# Patient Record
Sex: Male | Born: 1985 | Race: Black or African American | Hispanic: No | Marital: Single | Smoking: Current every day smoker
Health system: Southern US, Community
[De-identification: ages and names within clinical notes are randomized; demographics above are authoritative.]

## PROBLEM LIST (undated history)

## (undated) DIAGNOSIS — G629 Polyneuropathy, unspecified: Secondary | ICD-10-CM

## (undated) DIAGNOSIS — E119 Type 2 diabetes mellitus without complications: Secondary | ICD-10-CM

## (undated) DIAGNOSIS — F319 Bipolar disorder, unspecified: Secondary | ICD-10-CM

## (undated) HISTORY — PX: HEMORRHOID SURGERY: SHX153

## (undated) HISTORY — DX: Bipolar disorder, unspecified: F31.9

---

## 2021-08-09 ENCOUNTER — Emergency Department (HOSPITAL_BASED_OUTPATIENT_CLINIC_OR_DEPARTMENT_OTHER)
Admission: EM | Admit: 2021-08-09 | Discharge: 2021-08-09 | Discharge status: Against Medical Advice | Payer: Medicare Other | Attending: Emergency Medicine | Admitting: Emergency Medicine

## 2021-08-09 ENCOUNTER — Other Ambulatory Visit: Payer: Self-pay

## 2021-08-09 ENCOUNTER — Encounter (HOSPITAL_BASED_OUTPATIENT_CLINIC_OR_DEPARTMENT_OTHER): Payer: Self-pay | Admitting: Emergency Medicine

## 2021-08-09 DIAGNOSIS — G629 Polyneuropathy, unspecified: Secondary | ICD-10-CM | POA: Diagnosis not present

## 2021-08-09 DIAGNOSIS — E860 Dehydration: Secondary | ICD-10-CM | POA: Insufficient documentation

## 2021-08-09 DIAGNOSIS — R739 Hyperglycemia, unspecified: Secondary | ICD-10-CM | POA: Insufficient documentation

## 2021-08-09 DIAGNOSIS — E872 Acidosis, unspecified: Secondary | ICD-10-CM

## 2021-08-09 HISTORY — DX: Type 2 diabetes mellitus without complications: E11.9

## 2021-08-09 LAB — URINALYSIS, ROUTINE W REFLEX MICROSCOPIC
Bilirubin Urine: NEGATIVE
Glucose, UA: >=500 mg/dL — AB
Ketones, ur: >=80 mg/dL — AB
Leukocytes,Ua: NEGATIVE
Nitrite: NEGATIVE
Protein, ur: NEGATIVE mg/dL
Specific Gravity, Urine: <1.005 — ABNORMAL LOW (ref 1.005–1.030)
pH: 5 (ref 5.0–8.0)

## 2021-08-09 LAB — COMPREHENSIVE METABOLIC PANEL
ALT: 12 U/L (ref 0–44)
AST: 14 U/L — ABNORMAL LOW (ref 15–41)
Albumin: 2.9 g/dL — ABNORMAL LOW (ref 3.5–5.0)
Alkaline Phosphatase: 143 U/L — ABNORMAL HIGH (ref 38–126)
Anion gap: 16 — ABNORMAL HIGH (ref 5–15)
BUN: 15 mg/dL (ref 6–20)
CO2: 20 mmol/L — ABNORMAL LOW (ref 22–32)
Calcium: 8.4 mg/dL — ABNORMAL LOW (ref 8.9–10.3)
Chloride: 91 mmol/L — ABNORMAL LOW (ref 98–111)
Creatinine, Ser: 1.51 mg/dL — ABNORMAL HIGH (ref 0.61–1.24)
GFR, Estimated: >60 mL/min (ref >60)
Glucose, Bld: 522 mg/dL (ref 70–99)
Potassium: 4.9 mmol/L (ref 3.5–5.1)
Sodium: 127 mmol/L — ABNORMAL LOW (ref 135–145)
Total Bilirubin: 1.3 mg/dL — ABNORMAL HIGH (ref 0.3–1.2)
Total Protein: 7.6 g/dL (ref 6.5–8.1)

## 2021-08-09 LAB — I-STAT VENOUS BLOOD GAS, ED
Acid-base deficit: 1 mmol/L (ref 0.0–2.0)
Bicarbonate: 23 mmol/L (ref 20.0–28.0)
Calcium, Ion: 1.17 mmol/L (ref 1.15–1.40)
HCT: 31 % — ABNORMAL LOW (ref 39.0–52.0)
Hemoglobin: 10.5 g/dL — ABNORMAL LOW (ref 13.0–17.0)
O2 Saturation: 89 %
Patient temperature: 98.2
Potassium: 4.3 mmol/L (ref 3.5–5.1)
Sodium: 130 mmol/L — ABNORMAL LOW (ref 135–145)
TCO2: 24 mmol/L (ref 22–32)
pCO2, Ven: 33.3 mmHg — ABNORMAL LOW (ref 44–60)
pH, Ven: 7.447 — ABNORMAL HIGH (ref 7.25–7.43)
pO2, Ven: 53 mmHg — ABNORMAL HIGH (ref 32–45)

## 2021-08-09 LAB — CBG MONITORING, ED: Glucose-Capillary: 507 mg/dL (ref 70–99)

## 2021-08-09 LAB — CBC WITH DIFFERENTIAL/PLATELET
Abs Immature Granulocytes: 0.06 10*3/uL (ref 0.00–0.07)
Basophils Absolute: 0 10*3/uL (ref 0.0–0.1)
Basophils Relative: 0 %
Eosinophils Absolute: 0.1 10*3/uL (ref 0.0–0.5)
Eosinophils Relative: 1 %
HCT: 32.5 % — ABNORMAL LOW (ref 39.0–52.0)
Hemoglobin: 11.2 g/dL — ABNORMAL LOW (ref 13.0–17.0)
Immature Granulocytes: 1 %
Lymphocytes Relative: 19 %
Lymphs Abs: 1 10*3/uL (ref 0.7–4.0)
MCH: 27.5 pg (ref 26.0–34.0)
MCHC: 34.5 g/dL (ref 30.0–36.0)
MCV: 79.7 fL — ABNORMAL LOW (ref 80.0–100.0)
Monocytes Absolute: 0.5 10*3/uL (ref 0.1–1.0)
Monocytes Relative: 9 %
Neutro Abs: 3.6 10*3/uL (ref 1.7–7.7)
Neutrophils Relative %: 70 %
Platelets: 300 10*3/uL (ref 150–400)
RBC: 4.08 MIL/uL — ABNORMAL LOW (ref 4.22–5.81)
RDW: 13.5 % (ref 11.5–15.5)
WBC: 5.3 10*3/uL (ref 4.0–10.5)
nRBC: 0 % (ref 0.0–0.2)

## 2021-08-09 LAB — URINALYSIS, MICROSCOPIC (REFLEX)

## 2021-08-09 MED ORDER — OXYCODONE HCL 5 MG PO TABS
10.0000 mg | ORAL_TABLET | Freq: Once | ORAL | Status: AC
  Administered 2021-08-09: 10 mg via ORAL
  Filled 2021-08-09: qty 2

## 2021-08-09 MED ORDER — PREGABALIN 300 MG PO CAPS: 300.0000 mg | ORAL_CAPSULE | Freq: Three times a day (TID) | ORAL | 0 refills | Status: DC

## 2021-08-09 MED ORDER — LACTATED RINGERS IV BOLUS
1000.0000 mL | Freq: Once | INTRAVENOUS | Status: AC
  Administered 2021-08-09: 1000 mL via INTRAVENOUS

## 2021-08-09 MED ORDER — INSULIN LISPRO 100 UNIT/ML IJ SOLN: 40.0000 [IU] | Freq: Three times a day (TID) | INTRAMUSCULAR | 11 refills | Status: DC

## 2021-08-09 MED ORDER — INSULIN DETEMIR 100 UNIT/ML ~~LOC~~ SOLN
30.0000 [IU] | Freq: Every day | SUBCUTANEOUS | 11 refills | Status: DC
Start: 2021-08-09 — End: 2021-09-02

## 2021-08-09 MED ORDER — TRAZODONE HCL 150 MG PO TABS
150.0000 mg | ORAL_TABLET | Freq: Every day | ORAL | 0 refills | Status: DC
Start: 2021-08-09 — End: 2021-10-05

## 2021-08-09 MED ORDER — INSULIN DETEMIR 100 UNIT/ML ~~LOC~~ SOLN: 30.0000 [IU] | Freq: Every day | SUBCUTANEOUS | 11 refills | Status: DC

## 2021-08-09 MED ORDER — INSULIN GLARGINE-YFGN 100 UNIT/ML ~~LOC~~ SOLN
30.0000 [IU] | Freq: Once | SUBCUTANEOUS | Status: AC
  Administered 2021-08-09: 30 [IU] via SUBCUTANEOUS
  Filled 2021-08-09: qty 300

## 2021-08-09 MED ORDER — PREGABALIN 75 MG PO CAPS
300.0000 mg | ORAL_CAPSULE | Freq: Once | ORAL | Status: AC
  Administered 2021-08-09: 300 mg via ORAL
  Filled 2021-08-09: qty 4

## 2021-08-09 MED ORDER — INSULIN ASPART PROT & ASPART (70-30 MIX) 100 UNIT/ML ~~LOC~~ SUSP: 40.0000 [IU] | Freq: Three times a day (TID) | SUBCUTANEOUS | 11 refills | Status: DC

## 2021-08-09 MED ORDER — INSULIN ASPART 100 UNIT/ML IJ SOLN
10.0000 [IU] | Freq: Once | INTRAMUSCULAR | Status: AC
  Administered 2021-08-09: 10 [IU] via SUBCUTANEOUS

## 2021-08-09 NOTE — ED Triage Notes (Addendum)
Pt states he has been without his humalog and Levemir for about a week. His CBG meter at home is just reading high, and hes c/o feeling dehydrated. Denies SHOB or other sx. Also c/o pain in his legs "due to neuropathy. ?

## 2021-08-17 ENCOUNTER — Inpatient Hospital Stay (HOSPITAL_BASED_OUTPATIENT_CLINIC_OR_DEPARTMENT_OTHER)
Admission: EM | Admit: 2021-08-17 | Discharge: 2021-08-20 | DRG: 638 | Discharge status: Against Medical Advice | Payer: Medicare Other | Attending: Family Medicine | Admitting: Family Medicine

## 2021-08-17 ENCOUNTER — Encounter (HOSPITAL_BASED_OUTPATIENT_CLINIC_OR_DEPARTMENT_OTHER): Payer: Self-pay | Admitting: *Deleted

## 2021-08-17 ENCOUNTER — Other Ambulatory Visit: Payer: Self-pay

## 2021-08-17 ENCOUNTER — Emergency Department (HOSPITAL_BASED_OUTPATIENT_CLINIC_OR_DEPARTMENT_OTHER): Payer: Medicare Other

## 2021-08-17 DIAGNOSIS — R262 Difficulty in walking, not elsewhere classified: Secondary | ICD-10-CM

## 2021-08-17 DIAGNOSIS — R200 Anesthesia of skin: Secondary | ICD-10-CM

## 2021-08-17 DIAGNOSIS — E860 Dehydration: Secondary | ICD-10-CM | POA: Diagnosis present

## 2021-08-17 DIAGNOSIS — R4189 Other symptoms and signs involving cognitive functions and awareness: Secondary | ICD-10-CM

## 2021-08-17 DIAGNOSIS — D649 Anemia, unspecified: Secondary | ICD-10-CM | POA: Diagnosis not present

## 2021-08-17 DIAGNOSIS — F319 Bipolar disorder, unspecified: Secondary | ICD-10-CM | POA: Diagnosis not present

## 2021-08-17 DIAGNOSIS — Z87440 Personal history of urinary (tract) infections: Secondary | ICD-10-CM

## 2021-08-17 DIAGNOSIS — G894 Chronic pain syndrome: Secondary | ICD-10-CM | POA: Diagnosis not present

## 2021-08-17 DIAGNOSIS — T50995A Adverse effect of other drugs, medicaments and biological substances, initial encounter: Secondary | ICD-10-CM | POA: Diagnosis not present

## 2021-08-17 DIAGNOSIS — N179 Acute kidney failure, unspecified: Secondary | ICD-10-CM | POA: Diagnosis not present

## 2021-08-17 DIAGNOSIS — G629 Polyneuropathy, unspecified: Secondary | ICD-10-CM

## 2021-08-17 DIAGNOSIS — E1042 Type 1 diabetes mellitus with diabetic polyneuropathy: Secondary | ICD-10-CM | POA: Diagnosis present

## 2021-08-17 DIAGNOSIS — R739 Hyperglycemia, unspecified: Secondary | ICD-10-CM | POA: Diagnosis present

## 2021-08-17 DIAGNOSIS — E1065 Type 1 diabetes mellitus with hyperglycemia: Principal | ICD-10-CM | POA: Diagnosis present

## 2021-08-17 DIAGNOSIS — Z794 Long term (current) use of insulin: Secondary | ICD-10-CM | POA: Diagnosis not present

## 2021-08-17 DIAGNOSIS — R432 Parageusia: Secondary | ICD-10-CM | POA: Diagnosis present

## 2021-08-17 DIAGNOSIS — Z8616 Personal history of COVID-19: Secondary | ICD-10-CM | POA: Diagnosis not present

## 2021-08-17 DIAGNOSIS — F17210 Nicotine dependence, cigarettes, uncomplicated: Secondary | ICD-10-CM | POA: Diagnosis not present

## 2021-08-17 DIAGNOSIS — Z79899 Other long term (current) drug therapy: Secondary | ICD-10-CM

## 2021-08-17 DIAGNOSIS — F419 Anxiety disorder, unspecified: Secondary | ICD-10-CM | POA: Diagnosis present

## 2021-08-17 DIAGNOSIS — Z5329 Procedure and treatment not carried out because of patient's decision for other reasons: Secondary | ICD-10-CM | POA: Diagnosis not present

## 2021-08-17 DIAGNOSIS — R93429 Abnormal radiologic findings on diagnostic imaging of unspecified kidney: Secondary | ICD-10-CM

## 2021-08-17 DIAGNOSIS — R404 Transient alteration of awareness: Secondary | ICD-10-CM | POA: Diagnosis not present

## 2021-08-17 DIAGNOSIS — Z87898 Personal history of other specified conditions: Secondary | ICD-10-CM

## 2021-08-17 HISTORY — DX: Polyneuropathy, unspecified: G62.9

## 2021-08-17 LAB — BASIC METABOLIC PANEL
Anion gap: 11 (ref 5–15)
BUN: 11 mg/dL (ref 6–20)
CO2: 24 mmol/L (ref 22–32)
Calcium: 9.6 mg/dL (ref 8.9–10.3)
Chloride: 95 mmol/L — ABNORMAL LOW (ref 98–111)
Creatinine, Ser: 1.27 mg/dL — ABNORMAL HIGH (ref 0.61–1.24)
GFR, Estimated: >60 mL/min (ref >60)
Glucose, Bld: 399 mg/dL — ABNORMAL HIGH (ref 70–99)
Potassium: 4.1 mmol/L (ref 3.5–5.1)
Sodium: 130 mmol/L — ABNORMAL LOW (ref 135–145)

## 2021-08-17 LAB — CBC WITH DIFFERENTIAL/PLATELET
Abs Immature Granulocytes: 0.03 10*3/uL (ref 0.00–0.07)
Basophils Absolute: 0 10*3/uL (ref 0.0–0.1)
Basophils Relative: 0 %
Eosinophils Absolute: 0 10*3/uL (ref 0.0–0.5)
Eosinophils Relative: 1 %
HCT: 33.8 % — ABNORMAL LOW (ref 39.0–52.0)
Hemoglobin: 11.4 g/dL — ABNORMAL LOW (ref 13.0–17.0)
Immature Granulocytes: 1 %
Lymphocytes Relative: 19 %
Lymphs Abs: 1 10*3/uL (ref 0.7–4.0)
MCH: 27.1 pg (ref 26.0–34.0)
MCHC: 33.7 g/dL (ref 30.0–36.0)
MCV: 80.3 fL (ref 80.0–100.0)
Monocytes Absolute: 0.5 10*3/uL (ref 0.1–1.0)
Monocytes Relative: 9 %
Neutro Abs: 3.7 10*3/uL (ref 1.7–7.7)
Neutrophils Relative %: 70 %
Platelets: 377 10*3/uL (ref 150–400)
RBC: 4.21 MIL/uL — ABNORMAL LOW (ref 4.22–5.81)
RDW: 13.1 % (ref 11.5–15.5)
WBC: 5.2 10*3/uL (ref 4.0–10.5)
nRBC: 0 % (ref 0.0–0.2)

## 2021-08-17 LAB — URINALYSIS, ROUTINE W REFLEX MICROSCOPIC
Bilirubin Urine: NEGATIVE
Glucose, UA: >=500 mg/dL — AB
Ketones, ur: NEGATIVE mg/dL
Leukocytes,Ua: NEGATIVE
Nitrite: NEGATIVE
Protein, ur: NEGATIVE mg/dL
Specific Gravity, Urine: 1.015 (ref 1.005–1.030)
pH: 7 (ref 5.0–8.0)

## 2021-08-17 LAB — GLUCOSE, CAPILLARY
Glucose-Capillary: 169 mg/dL — ABNORMAL HIGH (ref 70–99)
Glucose-Capillary: 208 mg/dL — ABNORMAL HIGH (ref 70–99)
Glucose-Capillary: 226 mg/dL — ABNORMAL HIGH (ref 70–99)
Glucose-Capillary: 271 mg/dL — ABNORMAL HIGH (ref 70–99)
Glucose-Capillary: 278 mg/dL — ABNORMAL HIGH (ref 70–99)
Glucose-Capillary: 417 mg/dL — ABNORMAL HIGH (ref 70–99)

## 2021-08-17 LAB — LIPASE, BLOOD: Lipase: 41 U/L (ref 11–51)

## 2021-08-17 LAB — PHOSPHORUS: Phosphorus: 4 mg/dL (ref 2.5–4.6)

## 2021-08-17 LAB — I-STAT VENOUS BLOOD GAS, ED
Acid-Base Excess: 7 mmol/L — ABNORMAL HIGH (ref 0.0–2.0)
Bicarbonate: 32.1 mmol/L — ABNORMAL HIGH (ref 20.0–28.0)
Calcium, Ion: 1.15 mmol/L (ref 1.15–1.40)
HCT: 40 % (ref 39.0–52.0)
Hemoglobin: 13.6 g/dL (ref 13.0–17.0)
O2 Saturation: 39 %
Patient temperature: 98.1
Potassium: 5.6 mmol/L — ABNORMAL HIGH (ref 3.5–5.1)
Sodium: 125 mmol/L — ABNORMAL LOW (ref 135–145)
TCO2: 34 mmol/L — ABNORMAL HIGH (ref 22–32)
pCO2, Ven: 47 mmHg (ref 44–60)
pH, Ven: 7.441 — ABNORMAL HIGH (ref 7.25–7.43)
pO2, Ven: 22 mmHg — CL (ref 32–45)

## 2021-08-17 LAB — URINALYSIS, MICROSCOPIC (REFLEX)

## 2021-08-17 LAB — MRSA NEXT GEN BY PCR, NASAL: MRSA by PCR Next Gen: NOT DETECTED

## 2021-08-17 LAB — MAGNESIUM: Magnesium: 2.2 mg/dL (ref 1.7–2.4)

## 2021-08-17 LAB — CBG MONITORING, ED
Glucose-Capillary: >600 mg/dL (ref 70–99)
Glucose-Capillary: >600 mg/dL (ref 70–99)

## 2021-08-17 LAB — BETA-HYDROXYBUTYRIC ACID: Beta-Hydroxybutyric Acid: 0.14 mmol/L (ref 0.05–0.27)

## 2021-08-17 IMAGING — DX DG CHEST 1V PORT
2 series · 2 of 2 positions shown · non-contrast
Comparison: None.

CLINICAL DATA: Recent virus illness

EXAM:
PORTABLE CHEST 1 VIEW

[chest ap (1 of 2)]
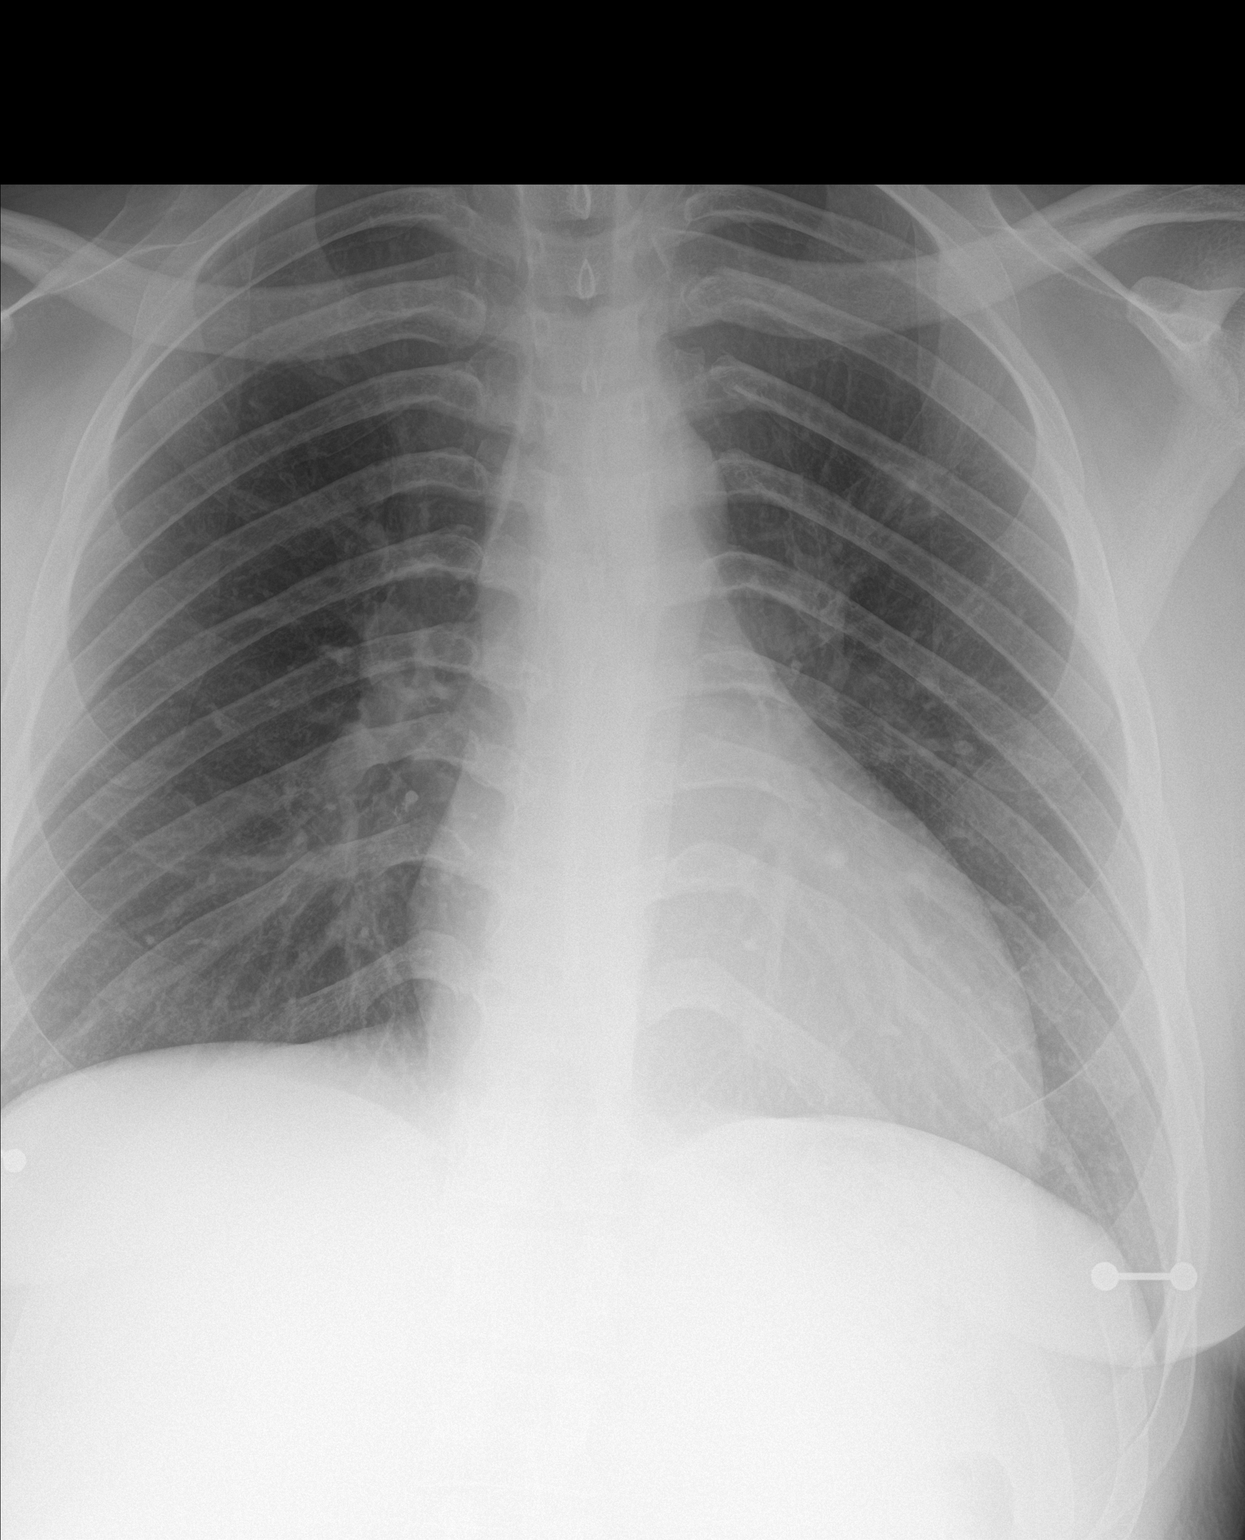

[chest ap (2 of 2)]
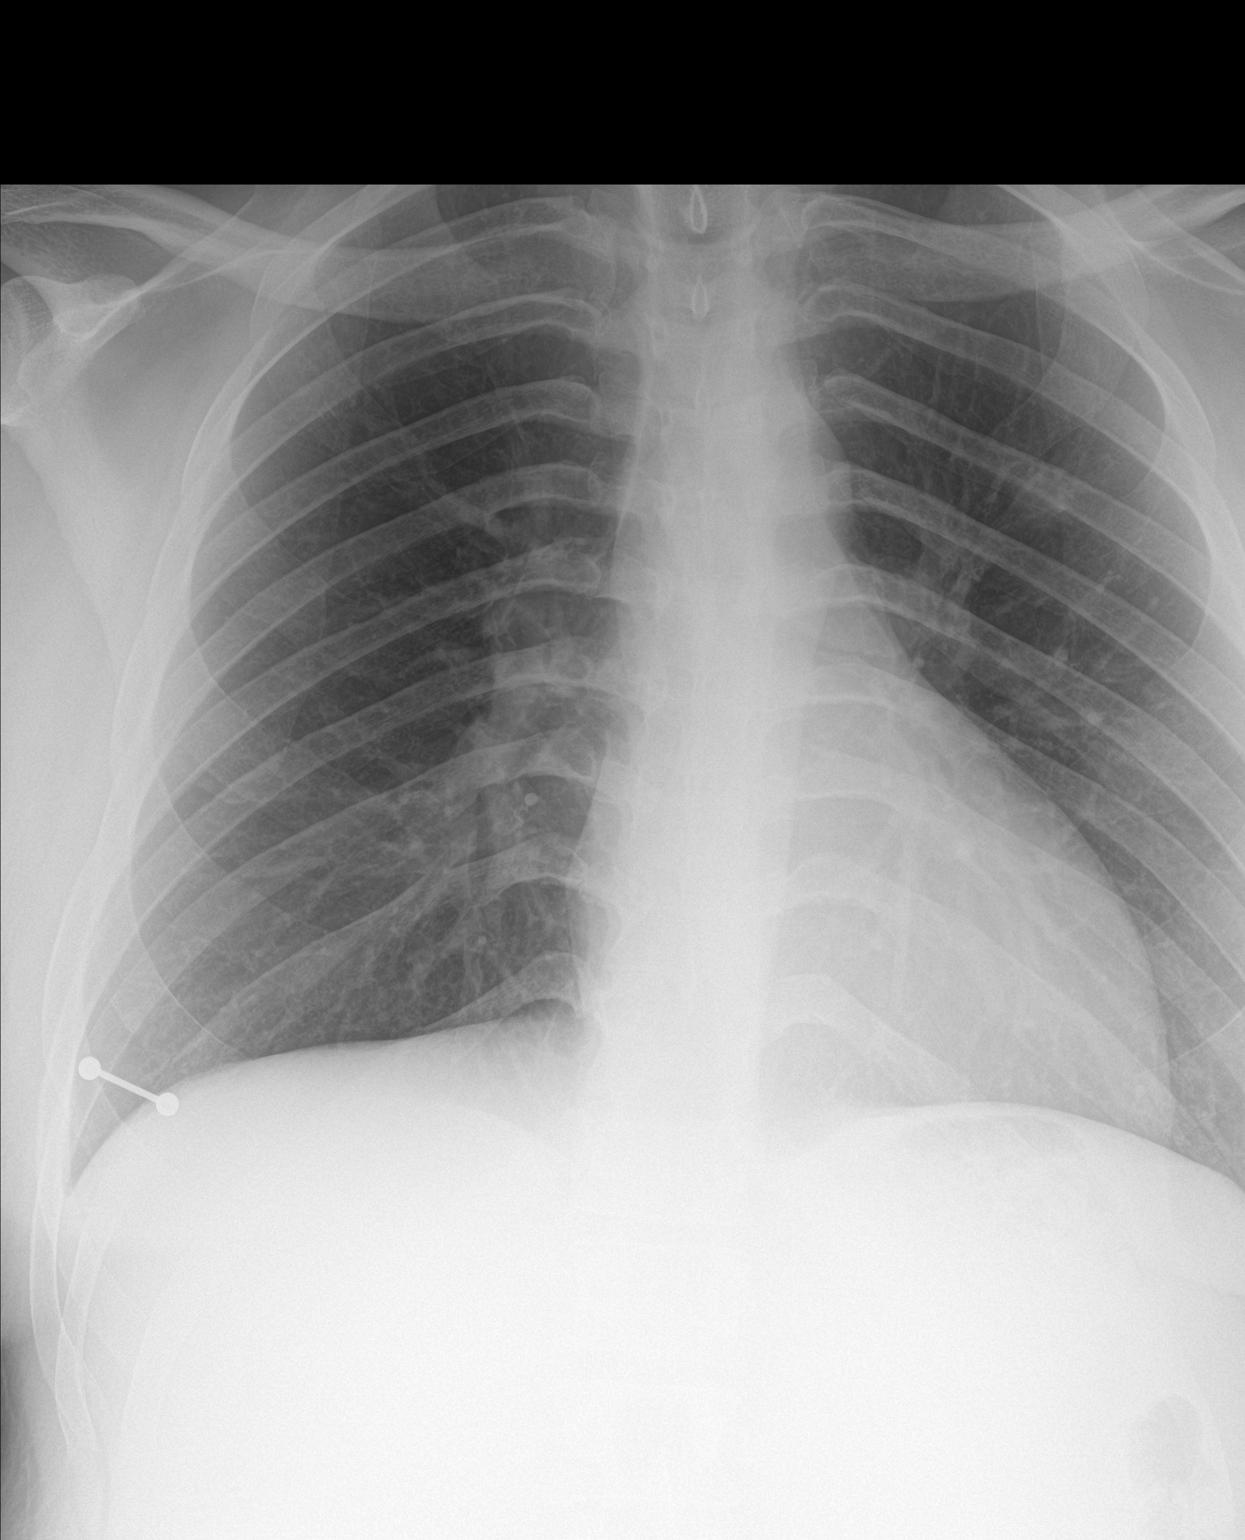

[2 of 2 positions shown; findings below may reference images not displayed]

FINDINGS: Transverse diameter of heart is slightly increased. There are no
signs of pulmonary edema or focal pulmonary consolidation. There is
no pleural effusion or pneumothorax.
IMPRESSION: No active disease.

## 2021-08-17 MED ORDER — DEXTROSE 50 % IV SOLN: 0.0000 mL | INTRAVENOUS | Status: DC | PRN

## 2021-08-17 MED ORDER — DEXTROSE IN LACTATED RINGERS 5 % IV SOLN: INTRAVENOUS | Status: DC

## 2021-08-17 MED ORDER — SODIUM CHLORIDE 0.9 % IV BOLUS: 1000.0000 mL | Freq: Once | INTRAVENOUS | Status: DC

## 2021-08-17 MED ORDER — ACETAMINOPHEN 650 MG RE SUPP: 650.0000 mg | Freq: Four times a day (QID) | RECTAL | Status: DC | PRN

## 2021-08-17 MED ORDER — POTASSIUM CHLORIDE 10 MEQ/100ML IV SOLN: 10.0000 meq | INTRAVENOUS | Status: DC

## 2021-08-17 MED ORDER — MORPHINE SULFATE ER 15 MG PO TBCR
15.0000 mg | EXTENDED_RELEASE_TABLET | Freq: Two times a day (BID) | ORAL | Status: DC
  Administered 2021-08-17 – 2021-08-19 (×5): 15 mg via ORAL
  Filled 2021-08-17 (×5): qty 1

## 2021-08-17 MED ORDER — ENOXAPARIN SODIUM 40 MG/0.4ML IJ SOSY
40.0000 mg | PREFILLED_SYRINGE | INTRAMUSCULAR | Status: DC
  Administered 2021-08-18 – 2021-08-19 (×2): 40 mg via SUBCUTANEOUS
  Filled 2021-08-17 (×2): qty 0.4

## 2021-08-17 MED ORDER — PREGABALIN 75 MG PO CAPS
300.0000 mg | ORAL_CAPSULE | Freq: Three times a day (TID) | ORAL | Status: DC
  Administered 2021-08-17 – 2021-08-19 (×7): 300 mg via ORAL
  Filled 2021-08-17: qty 3
  Filled 2021-08-17: qty 4
  Filled 2021-08-17: qty 3
  Filled 2021-08-17 (×4): qty 4

## 2021-08-17 MED ORDER — INSULIN REGULAR(HUMAN) IN NACL 100-0.9 UT/100ML-% IV SOLN: INTRAVENOUS | Status: DC

## 2021-08-17 MED ORDER — LACTATED RINGERS IV BOLUS
1000.0000 mL | Freq: Once | INTRAVENOUS | Status: AC
  Administered 2021-08-17: 1000 mL via INTRAVENOUS

## 2021-08-17 MED ORDER — ONDANSETRON HCL 4 MG/2ML IJ SOLN: 4.0000 mg | Freq: Four times a day (QID) | INTRAMUSCULAR | Status: DC | PRN

## 2021-08-17 MED ORDER — ONDANSETRON HCL 4 MG PO TABS: 4.0000 mg | ORAL_TABLET | Freq: Four times a day (QID) | ORAL | Status: DC | PRN

## 2021-08-17 MED ORDER — POTASSIUM CHLORIDE 10 MEQ/100ML IV SOLN
10.0000 meq | INTRAVENOUS | Status: AC
  Administered 2021-08-17: 10 meq via INTRAVENOUS
  Filled 2021-08-17: qty 100

## 2021-08-17 MED ORDER — ACETAMINOPHEN 325 MG PO TABS
650.0000 mg | ORAL_TABLET | Freq: Four times a day (QID) | ORAL | Status: DC | PRN
  Administered 2021-08-18: 650 mg via ORAL
  Filled 2021-08-17: qty 2

## 2021-08-17 MED ORDER — PREGABALIN 100 MG PO CAPS
200.0000 mg | ORAL_CAPSULE | Freq: Three times a day (TID) | ORAL | Status: DC
Start: 2021-08-17 — End: 2021-08-17

## 2021-08-17 MED ORDER — LACTATED RINGERS IV SOLN: INTRAVENOUS | Status: DC

## 2021-08-17 MED ORDER — INSULIN REGULAR(HUMAN) IN NACL 100-0.9 UT/100ML-% IV SOLN
INTRAVENOUS | Status: DC
  Administered 2021-08-17: 13 [IU]/h via INTRAVENOUS
  Administered 2021-08-18: 1.8 [IU]/h via INTRAVENOUS
  Filled 2021-08-17 (×2): qty 100

## 2021-08-17 MED ORDER — CHLORHEXIDINE GLUCONATE CLOTH 2 % EX PADS
6.0000 | MEDICATED_PAD | Freq: Every day | CUTANEOUS | Status: DC
  Administered 2021-08-17: 6 via TOPICAL

## 2021-08-17 MED ORDER — LACTATED RINGERS IV BOLUS: 20.0000 mL/kg | Freq: Once | INTRAVENOUS | Status: DC

## 2021-08-17 MED ORDER — OXYCODONE-ACETAMINOPHEN 7.5-325 MG PO TABS
1.0000 | ORAL_TABLET | Freq: Three times a day (TID) | ORAL | Status: DC | PRN
Start: 2021-08-17 — End: 2021-08-20
  Administered 2021-08-17 – 2021-08-18 (×3): 1 via ORAL
  Filled 2021-08-17 (×3): qty 1

## 2021-08-17 NOTE — ED Notes (Signed)
Waiting on dr to start IV , pt up on side of bed with assist of tech to void ?

## 2021-08-17 NOTE — ED Notes (Signed)
[place] IV attempt in right arm, attempt failed. ED MD and PA made aware. Blood obtained  ?

## 2021-08-17 NOTE — ED Triage Notes (Signed)
Pt reports recent covid illness 3 weeks ago. States testing negative but still has a cough. Blood sugar has "been high". States dizzy today and has fallen twice. States he has been vomiting over the last few days. He is type 1 diabetic ?

## 2021-08-17 NOTE — ED Provider Notes (Signed)
?[facility] EMERGENCY DEPARTMENT ?Provider Note ? ? ?CSN: [id number] ?Arrival date & time: 08/17/21  1346 ? ?  ? ?History ?PMH: DM1 ?Chief Complaint  ?Patient presents with  ? Hyperglycemia  ? ? ?Victor Matthews is a 36 y.o. male. ?Patient states that he has recently been feeling very ill.  He is a chief complaint of dizziness.  He states that about a month ago, he was dealing with a blood infection.  He said he then got COVID-19 about 3 weeks ago.  He says he has not really fully ever recovered from that.  He still has progressive cough, chest tightness, and some shortness of breath.  He also has type 1 diabetes and says that over the past couple of weeks his blood sugars have been remarkably elevated.  He states he was seen in the ED recently for elevated blood sugar, but was discharged home.  He says over the past day, he is gotten increasingly more dizzy and weak.  He says that he collapsed in the shower today because he was so dizzy and weak.  He did not pass out. He had associated n/v yesterday. He denies any abdominal pain, active chest pain, shortness of breath, dysuria, hematuria, decreased or increased urination. ? ? ?Hyperglycemia ?Associated symptoms: dizziness, fever, nausea, shortness of breath, vomiting and weakness   ? ?  ? ?Home Medications ?Prior to Admission medications   ?Medication Sig Start Date End Date Taking? Authorizing Provider  ?insulin aspart protamine- aspart (NOVOLOG MIX 70/30) (70-30) 100 UNIT/ML injection Inject 0.4 mLs (40 Units total) into the skin with breakfast, with lunch, and with evening meal. 08/09/21   Mesner, Barbara CowerJason, MD  ?insulin detemir (LEVEMIR) 100 UNIT/ML injection Inject 0.3 mLs (30 Units total) into the skin daily. 08/09/21   Mesner, Barbara CowerJason, MD  ?pregabalin (LYRICA) 300 MG capsule Take 1 capsule (300 mg total) by mouth in the morning, at noon, and at bedtime. 08/09/21 09/08/21  Mesner, Barbara CowerJason, MD  ?traZODone (DESYREL) 150 MG tablet Take 1 tablet (150 mg total) by  mouth at bedtime. 08/09/21   Mesner, Barbara CowerJason, MD  ?   ? ?Allergies    ?Patient has no known allergies.   ? ?Review of Systems   ?Review of Systems  ?Constitutional:  Positive for chills and fever.  ?Respiratory:  Positive for cough, chest tightness and shortness of breath.   ?Gastrointestinal:  Positive for nausea and vomiting.  ?Neurological:  Positive for dizziness and weakness.  ?All other systems reviewed and are negative. ? ?Physical Exam ?Updated Vital Signs ?BP (!) 163/116 (BP Location: Right Arm)   Pulse 88   Temp 98.1 ?F (36.7 ?C) (Oral)   Resp 18   Ht 6' (1.829 m)   Wt 99.3 kg   SpO2 99%   BMI 29.70 kg/m?  ?Physical Exam ?Vitals and nursing note reviewed.  ?Constitutional:   ?   General: He is not in acute distress. ?   Appearance: Normal appearance. He is not ill-appearing, toxic-appearing or diaphoretic.  ?HENT:  ?   Head: Normocephalic and atraumatic.  ?   Nose: No nasal deformity.  ?   Mouth/Throat:  ?   Lips: Pink. No lesions.  ?   Mouth: Mucous membranes are moist. No injury, lacerations, oral lesions or angioedema.  ?   Pharynx: Oropharynx is clear. Uvula midline. No pharyngeal swelling, oropharyngeal exudate, posterior oropharyngeal erythema or uvula swelling.  ?Eyes:  ?   General: Gaze aligned appropriately. No scleral icterus.    ?  Right eye: No discharge.     ?   Left eye: No discharge.  ?   Conjunctiva/sclera: Conjunctivae normal.  ?   Right eye: Right conjunctiva is not injected. No exudate or hemorrhage. ?   Left eye: Left conjunctiva is not injected. No exudate or hemorrhage. ?Cardiovascular:  ?   Rate and Rhythm: Normal rate and regular rhythm.  ?   Pulses: Normal pulses.     ?     Radial pulses are 2+ on the right side and 2+ on the left side.  ?     Dorsalis pedis pulses are 2+ on the right side and 2+ on the left side.  ?   Heart sounds: Normal heart sounds, S1 normal and S2 normal. Heart sounds not distant. No murmur heard. ?  No friction rub. No gallop. No S3 or S4 sounds.   ?Pulmonary:  ?   Effort: Pulmonary effort is normal. No accessory muscle usage or respiratory distress.  ?   Breath sounds: Normal breath sounds. No stridor. No wheezing, rhonchi or rales.  ?Chest:  ?   Chest wall: No tenderness.  ?Abdominal:  ?   General: Abdomen is flat. Bowel sounds are normal. There is no distension.  ?   Palpations: Abdomen is soft. There is no mass or pulsatile mass.  ?   Tenderness: There is no abdominal tenderness. There is no guarding or rebound.  ?Musculoskeletal:  ?   Right lower leg: No edema.  ?   Left lower leg: No edema.  ?Skin: ?   General: Skin is warm and dry.  ?   Coloration: Skin is not jaundiced or pale.  ?   Findings: No bruising, erythema, lesion or rash.  ?Neurological:  ?   General: No focal deficit present.  ?   Mental Status: He is alert and oriented to person, place, and time.  ?   GCS: GCS eye subscore is 4. GCS verbal subscore is 5. GCS motor subscore is 6.  ?Psychiatric:     ?   Mood and Affect: Mood normal.     ?   Behavior: Behavior normal. Behavior is cooperative.  ? ? ?ED Results / Procedures / Treatments   ?Labs ?(all labs ordered are listed, but only abnormal results are displayed) ?Labs Reviewed  ?CBC WITH DIFFERENTIAL/PLATELET - Abnormal; Notable for the following components:  ?    Result Value  ? RBC 4.21 (*)   ? Hemoglobin 11.4 (*)   ? HCT 33.8 (*)   ? All other components within normal limits  ?COMPREHENSIVE METABOLIC PANEL - Abnormal; Notable for the following components:  ? Sodium 125 (*)   ? Chloride 86 (*)   ? Glucose, Bld 886 (*)   ? Creatinine, Ser 1.34 (*)   ? Total Protein 8.7 (*)   ? Albumin 3.1 (*)   ? Alkaline Phosphatase 147 (*)   ? All other components within normal limits  ?URINALYSIS, ROUTINE W REFLEX MICROSCOPIC - Abnormal; Notable for the following components:  ? Color, Urine STRAW (*)   ? Glucose, UA >=500 (*)   ? Hgb urine dipstick TRACE (*)   ? All other components within normal limits  ?URINALYSIS, MICROSCOPIC (REFLEX) - Abnormal;  Notable for the following components:  ? Bacteria, UA RARE (*)   ? All other components within normal limits  ?CBG MONITORING, ED - Abnormal; Notable for the following components:  ? Glucose-Capillary >600 (*)   ? All other components within normal limits  ?I-STAT  VENOUS BLOOD GAS, ED - Abnormal; Notable for the following components:  ? pH, Ven 7.441 (*)   ? pO2, Ven 22 (*)   ? Bicarbonate 32.1 (*)   ? TCO2 34 (*)   ? Acid-Base Excess 7.0 (*)   ? Sodium 125 (*)   ? Potassium 5.6 (*)   ? All other components within normal limits  ?CBG MONITORING, ED - Abnormal; Notable for the following components:  ? Glucose-Capillary >600 (*)   ? All other components within normal limits  ?MRSA NEXT GEN BY PCR, NASAL  ?LIPASE, BLOOD  ?MAGNESIUM  ?PHOSPHORUS  ?BETA-HYDROXYBUTYRIC ACID  ?BASIC METABOLIC PANEL  ?GLUCOSE, CAPILLARY  ?I-STAT VENOUS BLOOD GAS, ED  ? ? ?EKG ?EKG Interpretation ? ?Date/Time:  Tuesday August 17 2021 14:21:42 EDT ?Ventricular Rate:  91 ?PR Interval:  180 ?QRS Duration: 91 ?QT Interval:  361 ?QTC Calculation: 445 ?R Axis:   -15 ?Text Interpretation: Sinus rhythm Probable left atrial enlargement Borderline left axis deviation ST elev, probable normal early repol pattern No previous ECGs available Confirmed by Vanetta Mulders [phone]) on 08/17/2021 2:27:05 PM ? ?Radiology ?DG Chest Portable 1 View ? ?Result Date: 08/17/2021 ?CLINICAL DATA:  Recent virus illness EXAM: PORTABLE CHEST 1 VIEW COMPARISON:  None. FINDINGS: Transverse diameter of heart is slightly increased. There are no signs of pulmonary edema or focal pulmonary consolidation. There is no pleural effusion or pneumothorax. IMPRESSION: No active disease. Electronically Signed   By: Ernie Avena M.D.   On: 08/17/2021 14:24   ? ?Procedures ?Marland KitchenCritical Care ?Performed by: Claudie Leach, PA-C ?Authorized by: Claudie Leach, PA-C  ? ?Critical care provider statement:  ?  Critical care time (minutes):  45 ?  Critical care was necessary to treat or  prevent imminent or life-threatening deterioration of the following conditions:  Endocrine crisis ?  Critical care was time spent personally by me on the following activities:  Blood draw for specimens, development of treat

## 2021-08-17 NOTE — Assessment & Plan Note (Addendum)
Pt seeing pain management for chronic BLE pain. ?Did verify with PMP aware that pt does take: ?1. Percocet 7.5 mg TID PRN ?2. MS Contin 15 mg BID scheduled ?3. Lyrica 300 mg PO TID ?He understands need to get refills for chronic pain meds from primary prescriber ?

## 2021-08-17 NOTE — ED Notes (Signed)
Report given to [facility]. 

## 2021-08-17 NOTE — H&P (Signed)
?History and Physical  ? ? ?Patient: Victor Matthews HYW:[id number] DOB: [birth year] ?DOA: 08/17/2021 ?DOS: the patient was seen and examined on 08/17/2021 ?PCP: Provider, Generic External Data  ?Patient coming from: Home ? ?Chief Complaint:  ?Chief Complaint  ?Patient presents with  ? Hyperglycemia  ? ?HPI: Victor Matthews is a 36 y.o. male with medical history significant of DM1 (acting like MODY), neuropathy, CPS followed by pain management. ? ?Pt presents to ED at [facility] with c/o dizziness, hyperglycemia. ? ?Pt in hospital in Feb with ESBL UTI ? ?Pt then had COVID in March. ? ?He says he has not really fully ever recovered from that.  He still has progressive cough, chest tightness, and some shortness of breath.  He also has type 1 diabetes and says that over the past couple of weeks his blood sugars have been remarkably elevated.  He states he was seen in the ED recently for elevated blood sugar, but was discharged home.  He says over the past day, he is gotten increasingly more dizzy and weak.  He says that he collapsed in the shower today because he was so dizzy and weak.  He did not pass out. He had associated n/v yesterday. He denies any abdominal pain, active chest pain, shortness of breath, dysuria, hematuria, decreased or increased urination. ? ? ?Review of Systems: As mentioned in the history of present illness. All other systems reviewed and are negative. ?Past Medical History:  ?Diagnosis Date  ? Diabetes mellitus without complication (HCC)   ? Neuropathy   ? ?Past Surgical History:  ?Procedure Laterality Date  ? HEMORRHOID SURGERY    ? ?Social History:  reports that he has been smoking cigarettes. He has never used smokeless tobacco. He reports current alcohol use. He reports that he does not use drugs. ? ?No Known Allergies ? ?No family history on file. ? ?Prior to Admission medications   ?Medication Sig Start Date End Date Taking? Authorizing Provider  ?insulin aspart protamine- aspart (NOVOLOG MIX 70/30)  (70-30) 100 UNIT/ML injection Inject 0.4 mLs (40 Units total) into the skin with breakfast, with lunch, and with evening meal. 08/09/21   Mesner, Barbara Cower, MD  ?insulin detemir (LEVEMIR) 100 UNIT/ML injection Inject 0.3 mLs (30 Units total) into the skin daily. 08/09/21   Mesner, Barbara Cower, MD  ?pregabalin (LYRICA) 300 MG capsule Take 1 capsule (300 mg total) by mouth in the morning, at noon, and at bedtime. 08/09/21 09/08/21  Mesner, Barbara Cower, MD  ?traZODone (DESYREL) 150 MG tablet Take 1 tablet (150 mg total) by mouth at bedtime. 08/09/21   Mesner, Barbara Cower, MD  ? ? ?Physical Exam: ?Vitals:  ? 08/17/21 1530 08/17/21 1740 08/17/21 1825 08/17/21 1957  ?BP: (!) 174/111 (!) 163/116    ?Pulse: 97 88    ?Resp: 19 18    ?Temp:    98.2 ?F (36.8 ?C)  ?TempSrc:    Oral  ?SpO2: 99% 99%    ?Weight:   98.4 kg   ?Height:   6' (1.829 m)   ? ?Constitutional: NAD, calm, comfortable ?Eyes: PERRL, lids and conjunctivae normal ?ENMT: Mucous membranes are moist. Posterior pharynx clear of any exudate or lesions.Normal dentition.  ?Neck: normal, supple, no masses, no thyromegaly ?Respiratory: clear to auscultation bilaterally, no wheezing, no crackles. Normal respiratory effort. No accessory muscle use.  ?Cardiovascular: Regular rate and rhythm, no murmurs / rubs / gallops. No extremity edema. 2+ pedal pulses. No carotid bruits.  ?Abdomen: no tenderness, no masses palpated. No hepatosplenomegaly. Bowel sounds positive.  ?Musculoskeletal:  no clubbing / cyanosis. No joint deformity upper and lower extremities. Good ROM, no contractures. Normal muscle tone.  ?Skin: no rashes, lesions, ulcers. No induration ?Neurologic: CN 2-12 grossly intact. Sensation intact, DTR normal. Strength 5/5 in all 4.  ?Psychiatric: Normal judgment and insight. Alert and oriented x 3. Normal mood.  ? ?Data Reviewed: ? ?CBG 886 in ED, now improved to 160 ?No acidosis, no AG. ? ?Assessment and Plan: ?* Hyperglycemia ?Hyperglycemia without DKA ?Improving on insulin gtt ?Probably to  convert to [facility] shortly. ? ?At home normally takes: ?Levemir 35u QHS ?Humalog 30u TID AC. ? ?Fasting CBGs running high per pt. ? ?Chronic pain syndrome ?Pt seeing pain management for chronic BLE pain. ?Did verify with PMP aware that pt does take: ?Percocet 7.5 TID PRN ?MS Contin 15 BID scheduled ?Lyrica 200mg  PO TID ? ?Most recent fills I can see on these are confirmed to be 07/12/21 (recent) ? ?Pt states Lyrica recently increased to 300mg  PO TID.  Will go ahead and order this dose. ?Will go ahead and order MS Contin and Percocet per most recent script doses. ? ? ? ? ? Advance Care Planning:   Code Status: Full Code ? ?Consults: None ? ?Family Communication: Family at bedside ? ?Severity of Illness: ?The appropriate patient status for this patient is OBSERVATION. Observation status is judged to be reasonable and necessary in order to provide the required intensity of service to ensure the patient's safety. The patient's presenting symptoms, physical exam findings, and initial radiographic and laboratory data in the context of their medical condition is felt to place them at decreased risk for further clinical deterioration. Furthermore, it is anticipated that the patient will be medically stable for discharge from the hospital within 2 midnights of admission.  ? ?Author: ?07/14/21., DO ?08/17/2021 9:01 PM ? ?For on call review [email or web address].  ?

## 2021-08-17 NOTE — Assessment & Plan Note (Addendum)
Transitioned to subcutaneous insulin today ?He's on 40 units levemir at home and 30 units humalog TID ac (it says 70/30 in his med rec, which doesn't seem right) ?Adjust basal/bolus prn ?He was eating sweet tea, [employer] earlier today -> education ?

## 2021-08-18 ENCOUNTER — Encounter (HOSPITAL_COMMUNITY): Payer: Self-pay | Admitting: Radiology

## 2021-08-18 ENCOUNTER — Observation Stay (HOSPITAL_COMMUNITY): Payer: Medicare Other

## 2021-08-18 ENCOUNTER — Other Ambulatory Visit (HOSPITAL_COMMUNITY): Payer: Medicare Other

## 2021-08-18 DIAGNOSIS — R739 Hyperglycemia, unspecified: Secondary | ICD-10-CM | POA: Diagnosis not present

## 2021-08-18 DIAGNOSIS — Z8616 Personal history of COVID-19: Secondary | ICD-10-CM

## 2021-08-18 DIAGNOSIS — R262 Difficulty in walking, not elsewhere classified: Secondary | ICD-10-CM

## 2021-08-18 DIAGNOSIS — G629 Polyneuropathy, unspecified: Secondary | ICD-10-CM

## 2021-08-18 DIAGNOSIS — Z87898 Personal history of other specified conditions: Secondary | ICD-10-CM

## 2021-08-18 DIAGNOSIS — R4189 Other symptoms and signs involving cognitive functions and awareness: Secondary | ICD-10-CM

## 2021-08-18 LAB — GLUCOSE, CAPILLARY
Glucose-Capillary: 143 mg/dL — ABNORMAL HIGH (ref 70–99)
Glucose-Capillary: 144 mg/dL — ABNORMAL HIGH (ref 70–99)
Glucose-Capillary: 151 mg/dL — ABNORMAL HIGH (ref 70–99)
Glucose-Capillary: 163 mg/dL — ABNORMAL HIGH (ref 70–99)
Glucose-Capillary: 166 mg/dL — ABNORMAL HIGH (ref 70–99)
Glucose-Capillary: 168 mg/dL — ABNORMAL HIGH (ref 70–99)
Glucose-Capillary: 190 mg/dL — ABNORMAL HIGH (ref 70–99)
Glucose-Capillary: 202 mg/dL — ABNORMAL HIGH (ref 70–99)
Glucose-Capillary: 214 mg/dL — ABNORMAL HIGH (ref 70–99)
Glucose-Capillary: 225 mg/dL — ABNORMAL HIGH (ref 70–99)
Glucose-Capillary: 400 mg/dL — ABNORMAL HIGH (ref 70–99)

## 2021-08-18 LAB — CBC
HCT: 31.7 % — ABNORMAL LOW (ref 39.0–52.0)
Hemoglobin: 10.9 g/dL — ABNORMAL LOW (ref 13.0–17.0)
MCH: 27.5 pg (ref 26.0–34.0)
MCHC: 34.4 g/dL (ref 30.0–36.0)
MCV: 80.1 fL (ref 80.0–100.0)
Platelets: 363 10*3/uL (ref 150–400)
RBC: 3.96 MIL/uL — ABNORMAL LOW (ref 4.22–5.81)
RDW: 13.2 % (ref 11.5–15.5)
WBC: 5.1 10*3/uL (ref 4.0–10.5)
nRBC: 0 % (ref 0.0–0.2)

## 2021-08-18 LAB — COMPREHENSIVE METABOLIC PANEL
ALT: 10 U/L (ref 0–44)
AST: 19 U/L (ref 15–41)
Albumin: 3.1 g/dL — ABNORMAL LOW (ref 3.5–5.0)
Alkaline Phosphatase: 147 U/L — ABNORMAL HIGH (ref 38–126)
Anion gap: 12 (ref 5–15)
BUN: 11 mg/dL (ref 6–20)
CO2: 27 mmol/L (ref 22–32)
Calcium: 9.3 mg/dL (ref 8.9–10.3)
Chloride: 86 mmol/L — ABNORMAL LOW (ref 98–111)
Creatinine, Ser: 1.34 mg/dL — ABNORMAL HIGH (ref 0.61–1.24)
GFR, Estimated: >60 mL/min (ref >60)
Glucose, Bld: 886 mg/dL (ref 70–99)
Potassium: 4.6 mmol/L (ref 3.5–5.1)
Sodium: 125 mmol/L — ABNORMAL LOW (ref 135–145)
Total Bilirubin: 0.5 mg/dL (ref 0.3–1.2)
Total Protein: 8.7 g/dL — ABNORMAL HIGH (ref 6.5–8.1)

## 2021-08-18 LAB — GLUCOSE, RANDOM: Glucose, Bld: 412 mg/dL — ABNORMAL HIGH (ref 70–99)

## 2021-08-18 LAB — BASIC METABOLIC PANEL
Anion gap: 9 (ref 5–15)
BUN: 10 mg/dL (ref 6–20)
CO2: 27 mmol/L (ref 22–32)
Calcium: 9.1 mg/dL (ref 8.9–10.3)
Chloride: 100 mmol/L (ref 98–111)
Creatinine, Ser: 0.94 mg/dL (ref 0.61–1.24)
GFR, Estimated: >60 mL/min (ref >60)
Glucose, Bld: 187 mg/dL — ABNORMAL HIGH (ref 70–99)
Potassium: 3.3 mmol/L — ABNORMAL LOW (ref 3.5–5.1)
Sodium: 136 mmol/L (ref 135–145)

## 2021-08-18 LAB — HIV ANTIBODY (ROUTINE TESTING W REFLEX): HIV Screen 4th Generation wRfx: NONREACTIVE

## 2021-08-18 IMAGING — DX DG LUMBAR SPINE 2-3V
3 series · 3 of 3 positions shown · non-contrast
Comparison: None.

CLINICAL DATA: Fall last week.

EXAM:
LUMBAR SPINE - 2-3 VIEW

[l-spine ap]
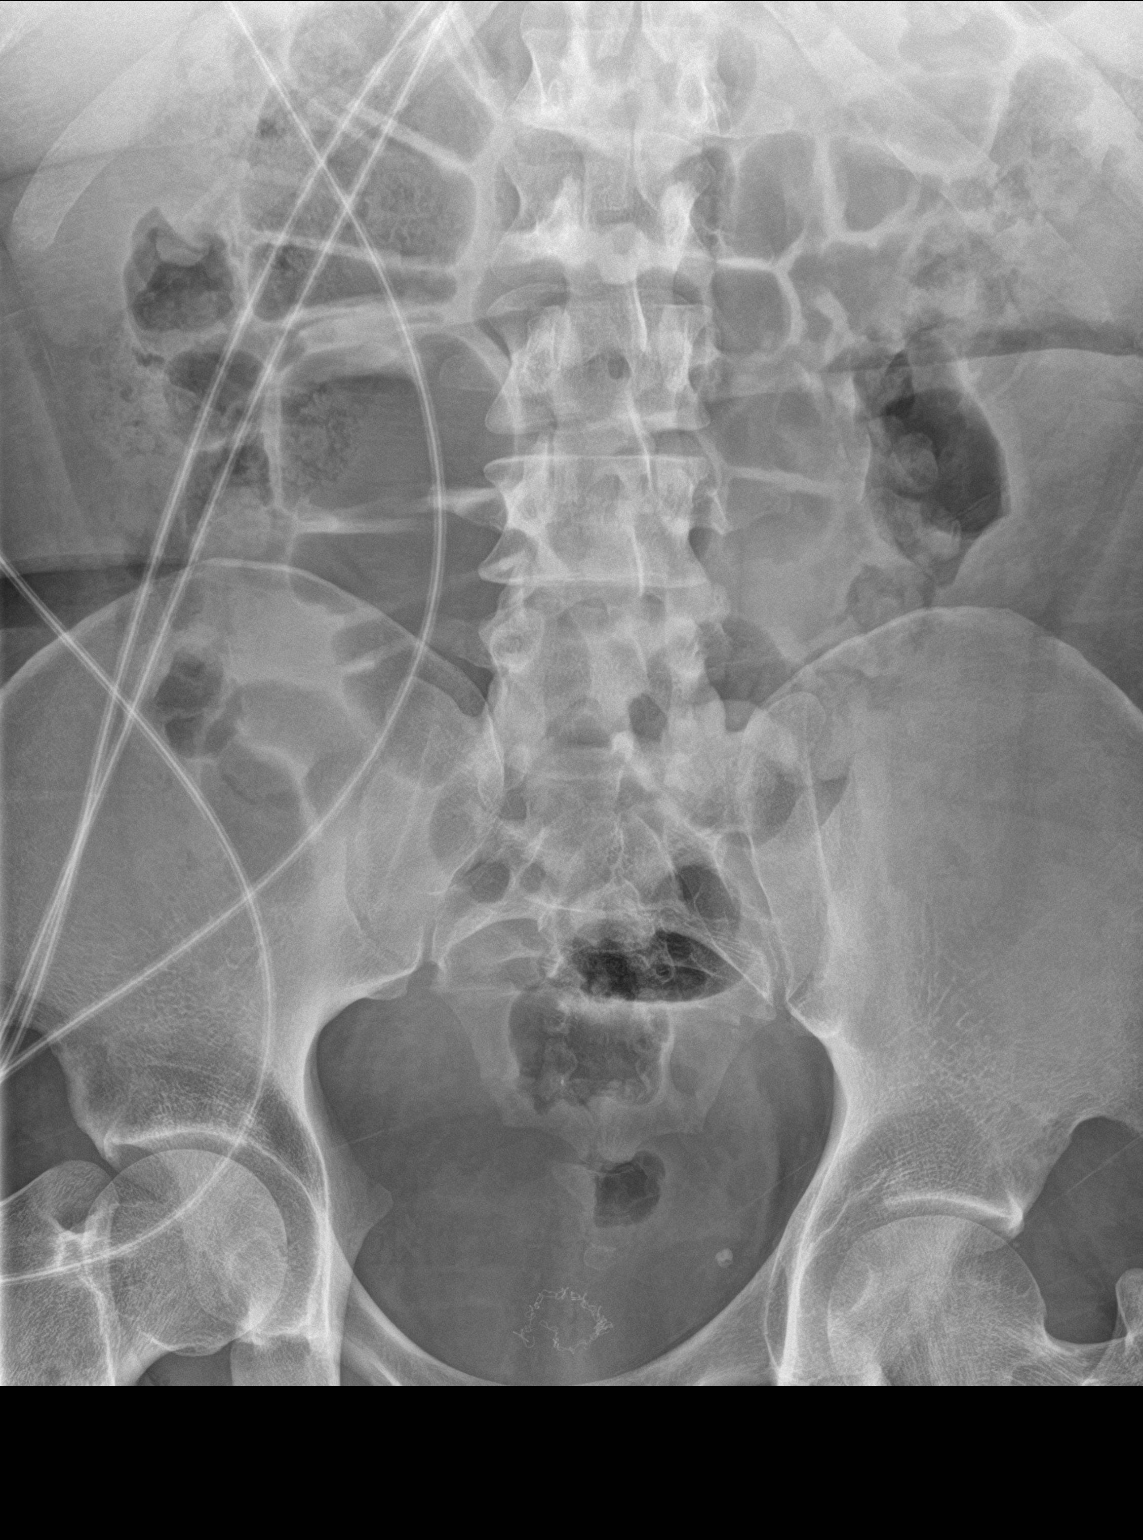

[l-spine lat]
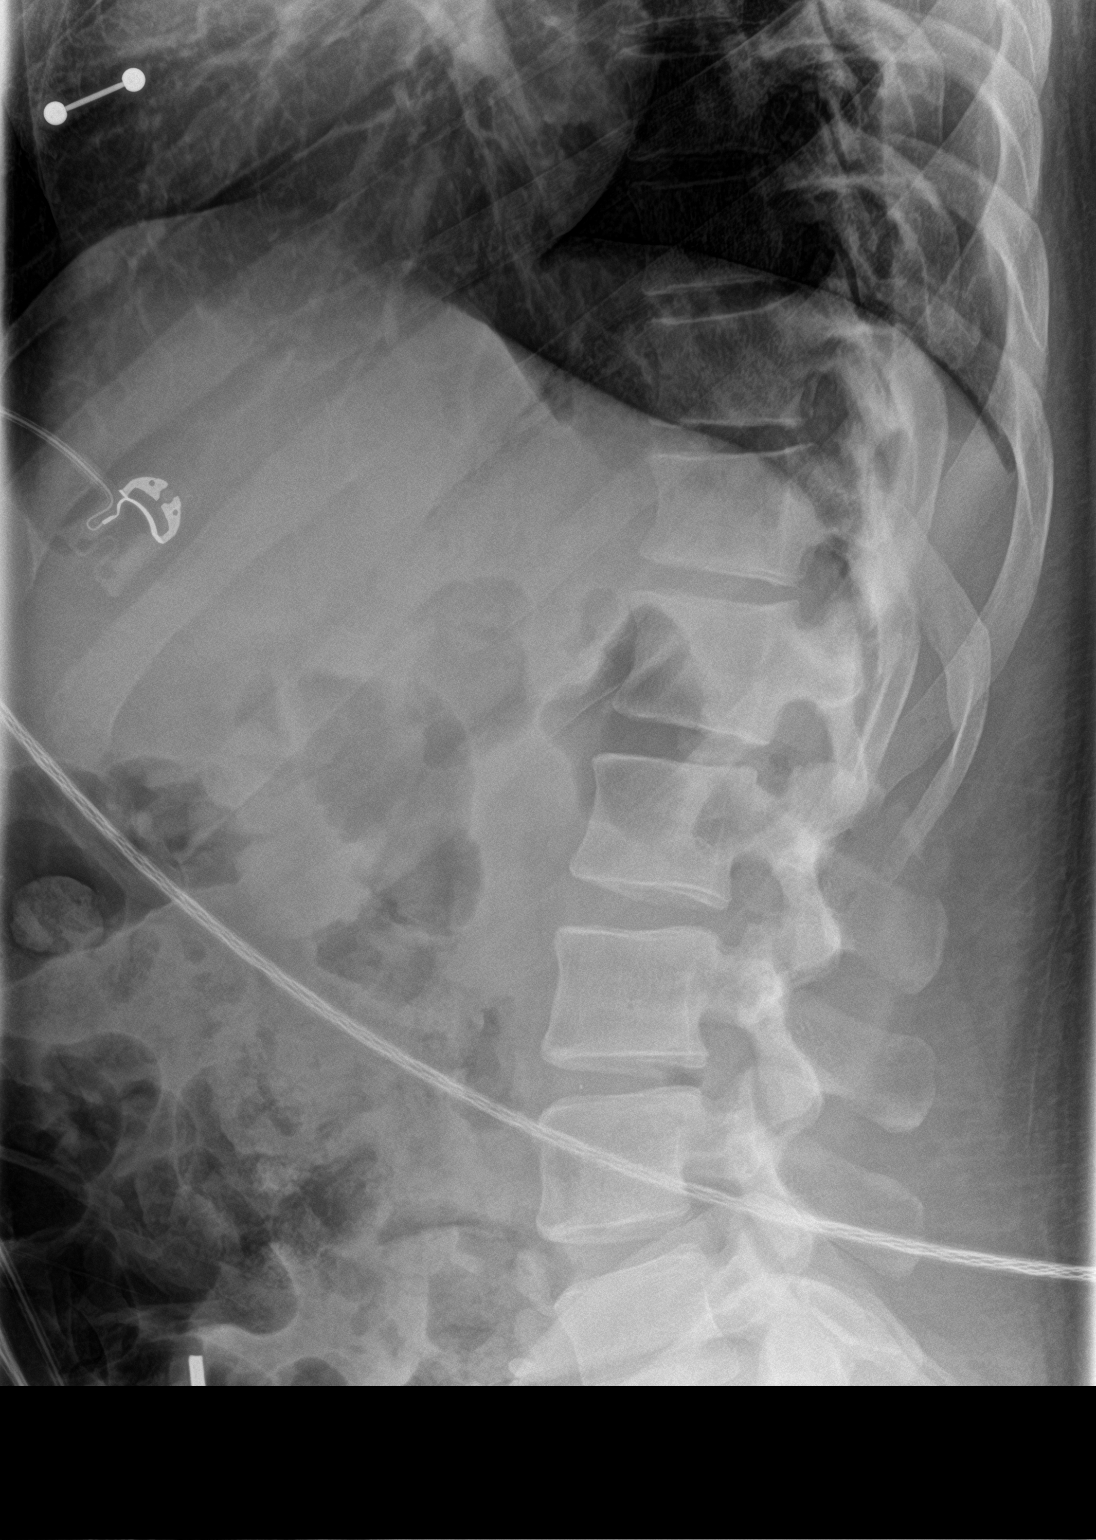

[l-spine spot]
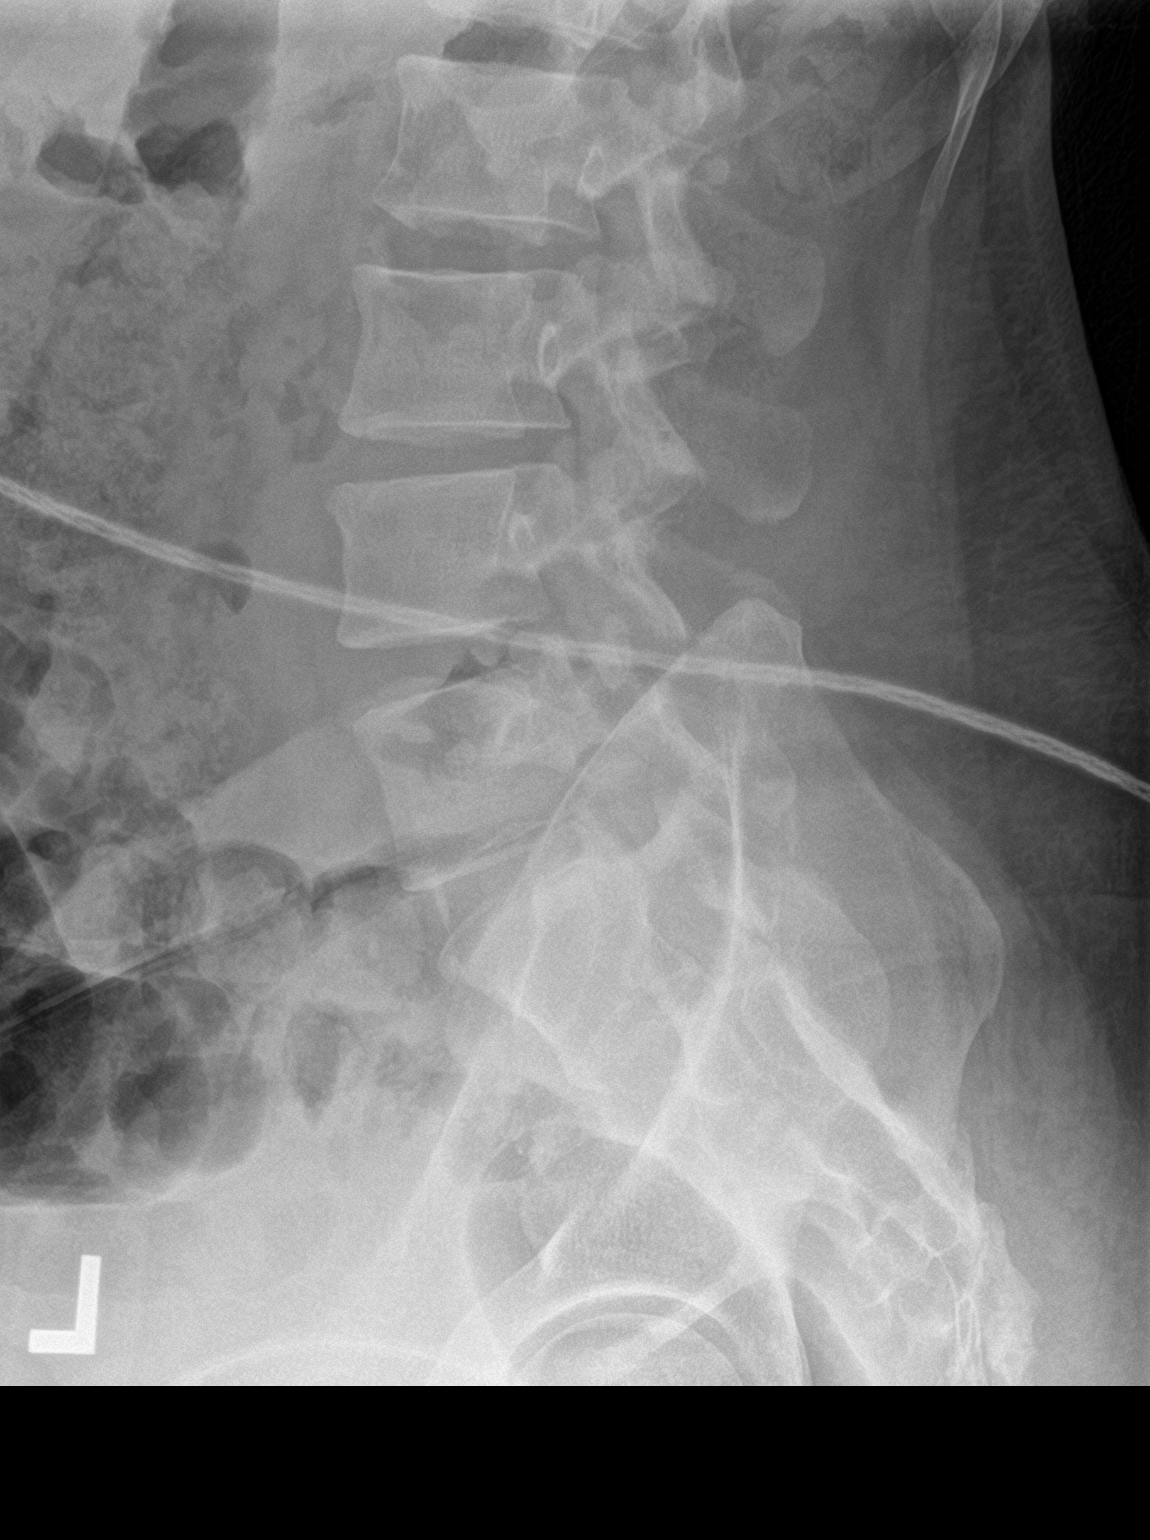

[3 of 3 positions shown; findings below may reference images not displayed]

FINDINGS: Normal alignment. Mild anterior wedging at T12 which is likely
developmental. No acute lumbar fracture. Normal disc spaces.

Bowel staples in the region of the lower rectum.
IMPRESSION: Mild anterior wedging of T12 which is likely developmental. No acute
lumbar fracture.

## 2021-08-18 IMAGING — MR MR LUMBAR SPINE WO/W CM
5 of 7 series · 30 of 48 positions shown · IV contrast (10 GADAVIST)
Comparison: None.

CLINICAL DATA: Low back pain

EXAM:
MRI LUMBAR SPINE WITHOUT AND WITH CONTRAST
TECHNIQUE: Multiplanar and multiecho pulse sequences of the lumbar spine were
obtained without and with intravenous contrast.
CONTRAST:  10mL GADAVIST GADOBUTROL 1 MMOL/ML IV SOLN

[Series 5: T1 · sagittal · 4.0mm · 0.81mm/px · 3 of 16 slices shown (1 of 2)]
[im 1/16]
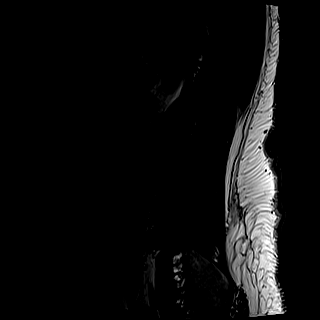
[im 8/16]
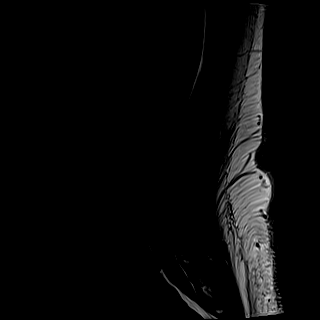
[im 16/16]
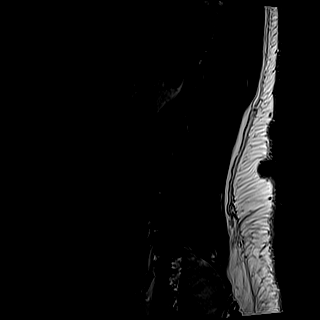

[Series 7: T2 · axial · 4.0mm · 0.62mm/px · z∈[-196,+86]mm · 11 of 49 slices shown]
[im 1/49]
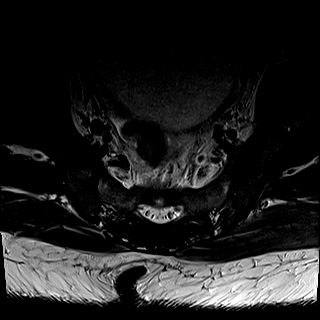
[im 5/49]
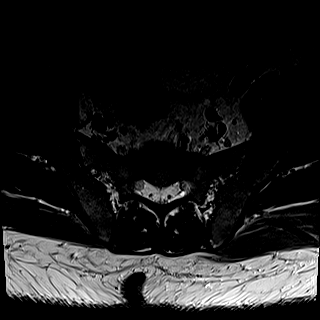
[im 10/49]
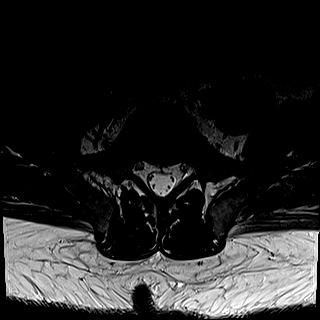
[im 15/49]
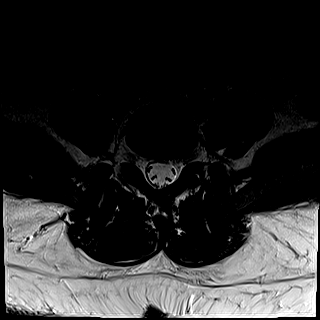
[im 20/49]
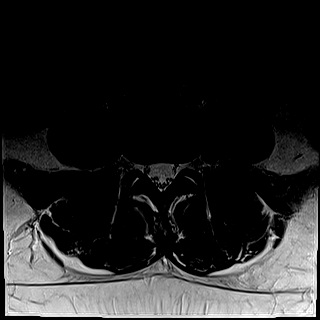
[im 25/49]
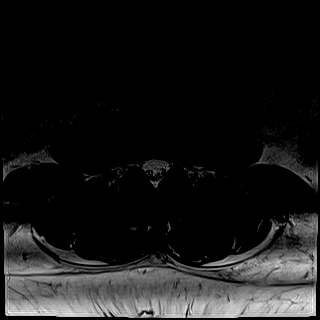
[im 29/49]
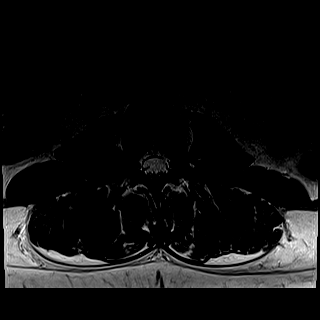
[im 34/49]
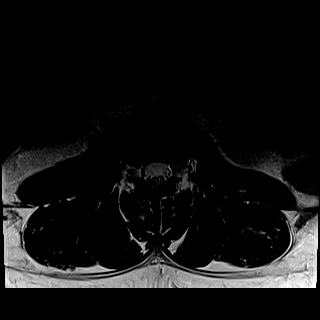
[im 39/49]
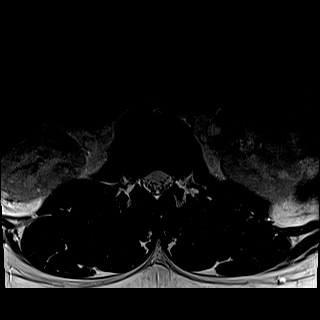
[im 44/49]
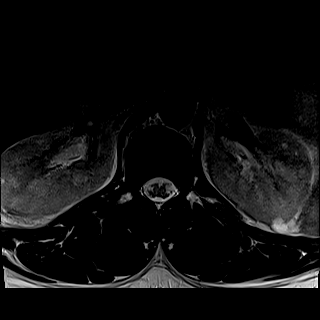
[im 49/49]
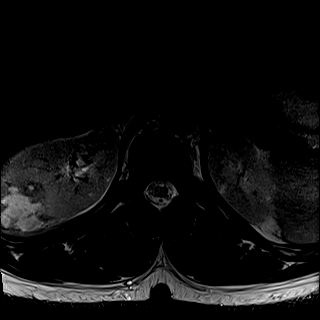

[Series 8: T1 · axial · 4.0mm · 0.39mm/px · z∈[-196,+86]mm · 11 of 49 slices shown (2 of 2)]
[im 1/49]
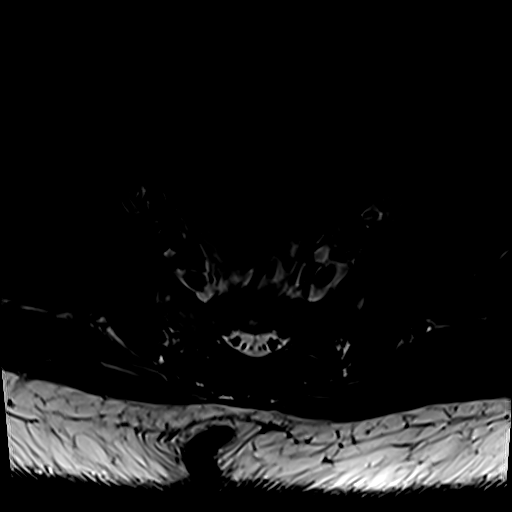
[im 5/49]
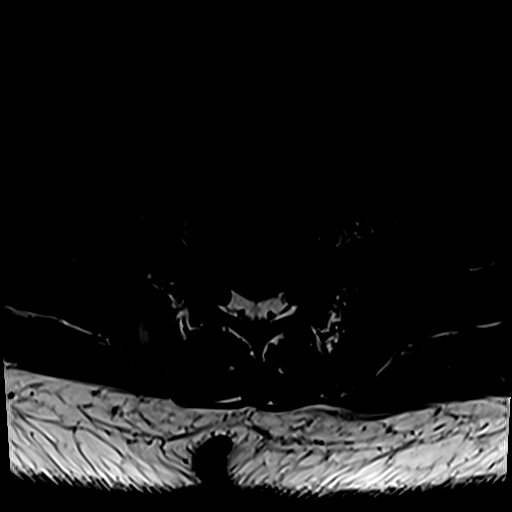
[im 10/49]
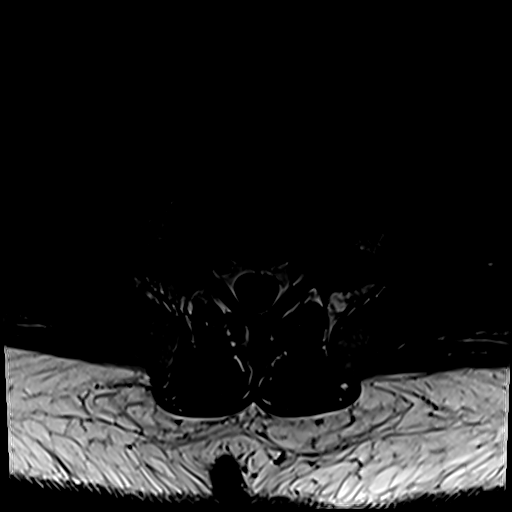
[im 15/49]
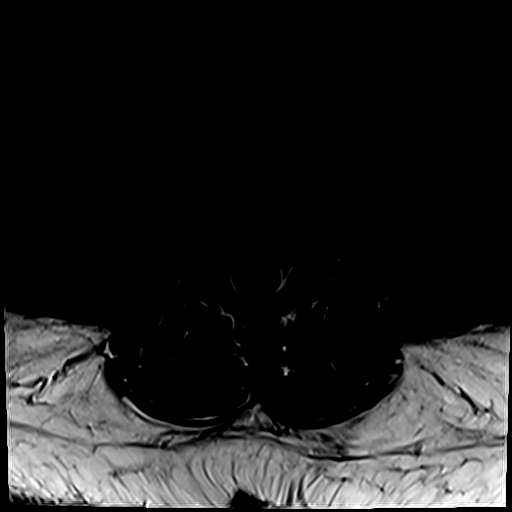
[im 20/49]
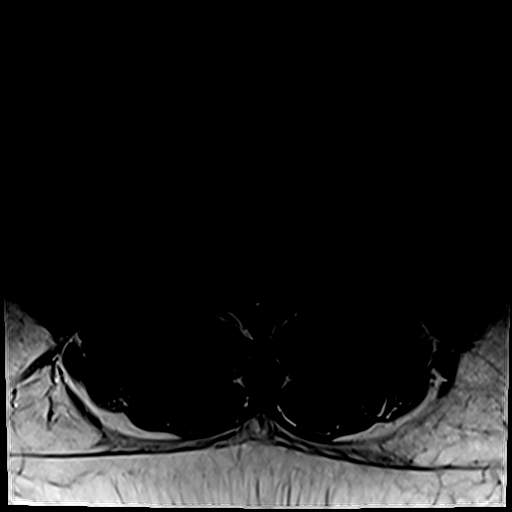
[im 25/49]
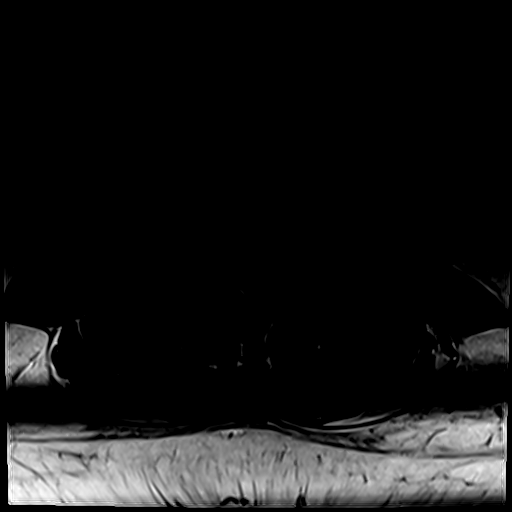
[im 29/49]
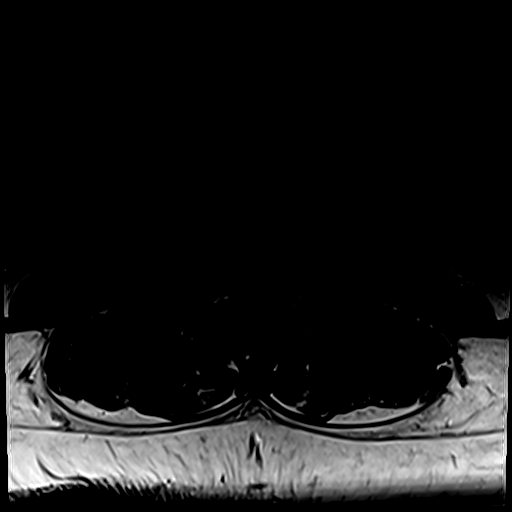
[im 34/49]
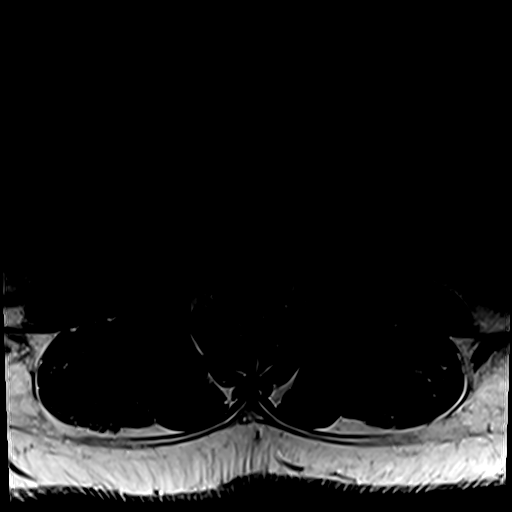
[im 39/49]
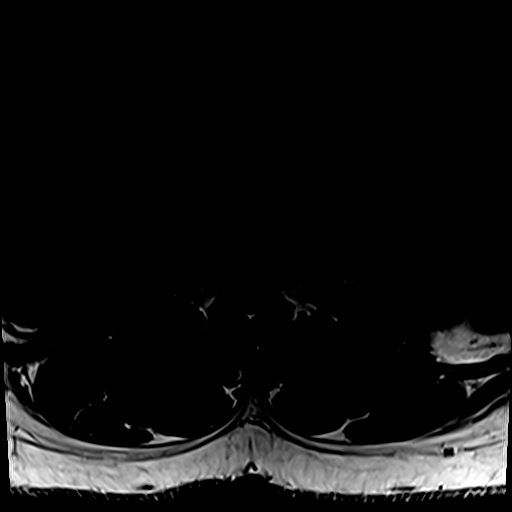
[im 44/49]
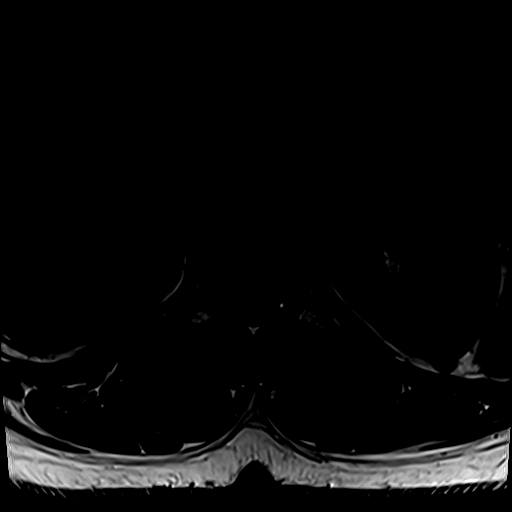
[im 49/49]
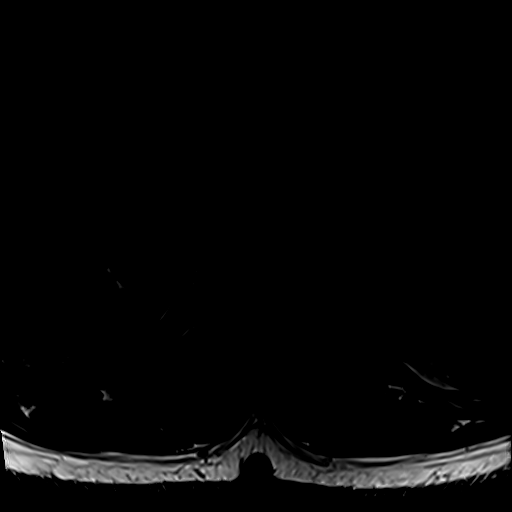

[Series 9: T2 post-contrast · sagittal · 4.0mm · 0.81mm/px · 4 of 16 slices shown]
[im 1/16]
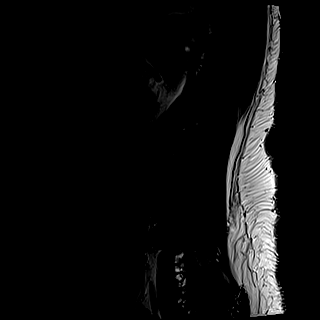
[im 6/16]
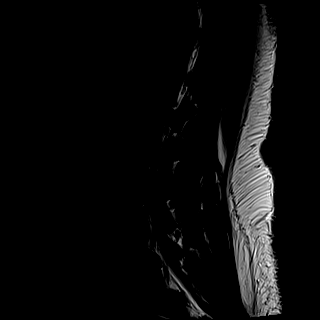
[im 11/16]
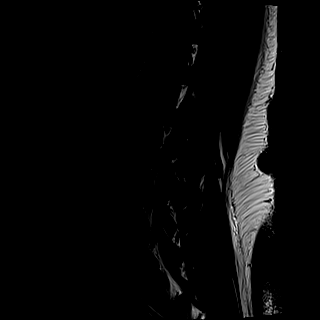
[im 16/16]
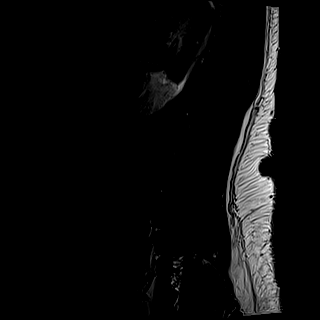

[Series 10: T1 fat-sat post-contrast · sagittal · 4.0mm · 0.81mm/px · 1 of 16 slices shown]
[im 1/16]
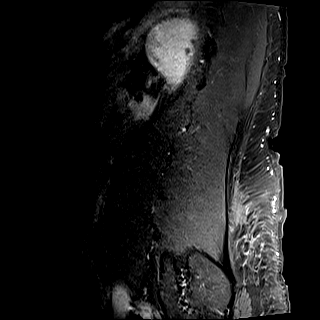

[30 of 48 positions shown; findings below may reference images not displayed]

FINDINGS: Segmentation:  Standard.

Alignment:  Physiologic.

Vertebrae:  No fracture, evidence of discitis, or bone lesion.

Conus medullaris and cauda equina: Conus extends to the L1 level.
Conus and cauda equina appear normal.

Paraspinal and other soft tissues: Heterogeneous, partially cystic
lesion of the right kidney. Renal ultrasound recommended for further
characterization.

Disc levels:

L1-L2: Normal disc space and facet joints. No spinal canal stenosis.
No neural foraminal stenosis.

L2-L3: Normal disc space and facet joints. No spinal canal stenosis.
No neural foraminal stenosis.

L3-L4: Disc desiccation with minimal bulge. No spinal canal
stenosis. No neural foraminal stenosis.

L4-L5: Normal disc space and facet joints. No spinal canal stenosis.
No neural foraminal stenosis.

L5-S1: Small left subarticular disc protrusion. Left lateral recess
narrowing without central spinal canal stenosis. No neural foraminal
stenosis.

Visualized sacrum: Normal.
IMPRESSION: 1. Small left subarticular disc protrusion at L5-S1 narrowing the
left lateral recess. Correlate for left S1 radiculopathy.
2. Otherwise mild lumbar degenerative disc disease without spinal
canal or neural foraminal stenosis.
3. Heterogeneous, partially cystic lesion of the right kidney. Renal
ultrasound recommended to assess for possible neoplasm.

## 2021-08-18 MED ORDER — TRAZODONE HCL 50 MG PO TABS
150.0000 mg | ORAL_TABLET | Freq: Every day | ORAL | Status: DC
  Administered 2021-08-18 – 2021-08-19 (×2): 150 mg via ORAL
  Filled 2021-08-18 (×2): qty 1

## 2021-08-18 MED ORDER — MELATONIN 3 MG PO TABS
3.0000 mg | ORAL_TABLET | Freq: Once | ORAL | Status: AC
  Administered 2021-08-18: 3 mg via ORAL
  Filled 2021-08-18: qty 1

## 2021-08-18 MED ORDER — INSULIN ASPART 100 UNIT/ML IJ SOLN
0.0000 [IU] | Freq: Three times a day (TID) | INTRAMUSCULAR | Status: DC
  Administered 2021-08-18: 20 [IU] via SUBCUTANEOUS
  Administered 2021-08-19: 7 [IU] via SUBCUTANEOUS
  Administered 2021-08-19: 11 [IU] via SUBCUTANEOUS

## 2021-08-18 MED ORDER — INSULIN ASPART 100 UNIT/ML IJ SOLN
15.0000 [IU] | Freq: Three times a day (TID) | INTRAMUSCULAR | Status: DC
  Administered 2021-08-18 – 2021-08-19 (×4): 15 [IU] via SUBCUTANEOUS

## 2021-08-18 MED ORDER — INSULIN ASPART 100 UNIT/ML IJ SOLN: 0.0000 [IU] | Freq: Every day | INTRAMUSCULAR | Status: DC

## 2021-08-18 MED ORDER — LIDOCAINE 5 % EX PTCH
1.0000 | MEDICATED_PATCH | CUTANEOUS | Status: DC
  Filled 2021-08-18 (×3): qty 1

## 2021-08-18 MED ORDER — INSULIN GLARGINE-YFGN 100 UNIT/ML ~~LOC~~ SOLN
30.0000 [IU] | Freq: Every day | SUBCUTANEOUS | Status: DC
Start: 2021-08-19 — End: 2021-08-19
  Filled 2021-08-18: qty 0.3

## 2021-08-18 MED ORDER — INSULIN ASPART 100 UNIT/ML IJ SOLN
0.0000 [IU] | Freq: Three times a day (TID) | INTRAMUSCULAR | Status: DC
  Administered 2021-08-18: 2 [IU] via SUBCUTANEOUS
  Administered 2021-08-18: 15 [IU] via SUBCUTANEOUS

## 2021-08-18 MED ORDER — INSULIN GLARGINE-YFGN 100 UNIT/ML ~~LOC~~ SOLN
10.0000 [IU] | Freq: Once | SUBCUTANEOUS | Status: AC
  Administered 2021-08-18: 10 [IU] via SUBCUTANEOUS
  Filled 2021-08-18: qty 0.1

## 2021-08-18 MED ORDER — KETOROLAC TROMETHAMINE 15 MG/ML IJ SOLN
15.0000 mg | Freq: Four times a day (QID) | INTRAMUSCULAR | Status: DC
Start: 2021-08-18 — End: 2021-08-20
  Administered 2021-08-18 – 2021-08-20 (×7): 15 mg via INTRAVENOUS
  Filled 2021-08-18 (×7): qty 1

## 2021-08-18 MED ORDER — INSULIN GLARGINE-YFGN 100 UNIT/ML ~~LOC~~ SOLN
20.0000 [IU] | Freq: Once | SUBCUTANEOUS | Status: AC
  Administered 2021-08-18: 20 [IU] via SUBCUTANEOUS
  Filled 2021-08-18: qty 0.2

## 2021-08-18 MED ORDER — GADOBUTROL 1 MMOL/ML IV SOLN
10.0000 mL | Freq: Once | INTRAVENOUS | Status: AC | PRN
  Administered 2021-08-18: 10 mL via INTRAVENOUS

## 2021-08-18 MED ORDER — SODIUM CHLORIDE 0.9% FLUSH
3.0000 mL | Freq: Two times a day (BID) | INTRAVENOUS | Status: DC
  Administered 2021-08-18 – 2021-08-19 (×4): 3 mL via INTRAVENOUS

## 2021-08-18 MED ORDER — QUETIAPINE FUMARATE 100 MG PO TABS
100.0000 mg | ORAL_TABLET | Freq: Every evening | ORAL | Status: DC | PRN
  Administered 2021-08-18 – 2021-08-20 (×2): 100 mg via ORAL
  Filled 2021-08-18 (×2): qty 1

## 2021-08-18 MED ORDER — POTASSIUM CHLORIDE CRYS ER 20 MEQ PO TBCR
20.0000 meq | EXTENDED_RELEASE_TABLET | Freq: Once | ORAL | Status: AC
  Administered 2021-08-18: 20 meq via ORAL
  Filled 2021-08-18: qty 1

## 2021-08-18 MED ORDER — LORAZEPAM 2 MG/ML IJ SOLN
1.0000 mg | Freq: Once | INTRAMUSCULAR | Status: AC | PRN
  Administered 2021-08-18: 1 mg via INTRAVENOUS
  Filled 2021-08-18: qty 1

## 2021-08-18 MED ORDER — SODIUM CHLORIDE 0.9% FLUSH: 3.0000 mL | INTRAVENOUS | Status: DC | PRN

## 2021-08-18 MED ORDER — SODIUM CHLORIDE 0.9 % IV SOLN: 250.0000 mL | INTRAVENOUS | Status: DC | PRN

## 2021-08-18 MED ORDER — INSULIN ASPART 100 UNIT/ML IJ SOLN
8.0000 [IU] | Freq: Three times a day (TID) | INTRAMUSCULAR | Status: DC
  Administered 2021-08-18: 8 [IU] via SUBCUTANEOUS

## 2021-08-18 NOTE — Assessment & Plan Note (Signed)
E coli in 2/10 blood cx ?Treated and blood cx since negative ?

## 2021-08-18 NOTE — Progress Notes (Addendum)
?  Inpatient Rehab Admissions Coordinator : ? ?Per therapy recommendations patient was screened for CIR candidacy by Danne Baxter RN MSN. Patient does not appear to demonstrate the medical neccesity for a [place] /CIR admit and it is unlikely that [facility] will approve admit for this diagnosis.  I will not place a Rehab Consult. Recommend other Rehab Venues to be pursued. Please contact me with any questions. TOC made aware, Cookie RN CM. ? ?Danne Baxter RN MSN ?Admissions Coordinator ?[phone]  ?

## 2021-08-18 NOTE — Hospital Course (Signed)
36 yo with hx T1DY, chronic pain, neuropathy, anxiety, bipolar disorder with recent E Coli bacteremia in early February and COVID infection late February presenting with hyperglycemia, malaise, and ambulatory dysfunction.  ?

## 2021-08-18 NOTE — Evaluation (Addendum)
Physical Therapy Evaluation ?Patient Details ?Name: Victor SanesLarry Matthews ?MRN: [id number] ?DOB: [birth year] ?Today's Date: 08/18/2021 ? ?History of Present Illness ? 36 yo male admitted with weakness, falls, hyperglycemia. Hx of  DM, chronic pain, neuropathy, anxiety, bipolar disorder with recent E Coli bacteremia in early February and COVID infection late February presenting with hyperglycemia, malaise, and ambulatory dysfunction.  ?Clinical Impression ? On eval, pt required Min A +2 for mobility. He was able to stand x 2 for ~10 seconds with a RW. Pt reports impaired sensation and gradually worsening weakness prior to admission. He reports pain in both feet and LEs, as well as some back pain. At baseline, he was ambulatory with rollator vs cane. He is hopeful to regain his PLOF and independence. PT recommendation is acute inpatient rehab consult. Will plan to follow and progress activity as tolerated. If CIR is not an option, may need to consider ST SNF.  ?   ?   ? ?Recommendations for follow up therapy are one component of a multi-disciplinary discharge planning process, led by the attending physician.  Recommendations may be updated based on patient status, additional functional criteria and insurance authorization. ? ?Follow Up Recommendations Acute inpatient rehab (3hours/day) ? ?  ?Assistance Recommended at Discharge Frequent or constant Supervision/Assistance  ?Patient can return home with the following ? Two people to help with walking and/or transfers;A lot of help with bathing/dressing/bathroom;Help with stairs or ramp for entrance;Assist for transportation;Assistance with cooking/housework ? ?  ?Equipment Recommendations  (continuing to assess)  ?Recommendations for Other Services ? Rehab consult;OT consult  ?  ?Functional Status Assessment Patient has had a recent decline in their functional status and demonstrates the ability to make significant improvements in function in a reasonable and predictable amount of  time.  ? ?  ?Precautions / Restrictions Precautions ?Precautions: Fall ?Precaution Comments: frequent falls, hx of neuropathy ?Restrictions ?Weight Bearing Restrictions: No  ? ?  ? ?Mobility ? Bed Mobility ?Overal bed mobility: Needs Assistance ?Bed Mobility: Supine to Sit, Sit to Supine ?  ?  ?Supine to sit: Supervision ?Sit to supine: Min assist ?  ?General bed mobility comments: increased time but no physical assistance needed. No assist needed for L LE back onto bed. Min A for R LE onto bed. ?  ? ?Transfers ?Overall transfer level: Needs assistance ?Equipment used: Rolling walker (2 wheels) ?Transfers: Sit to/from Stand ?Sit to Stand: Min assist, +2 safety/equipment, +2 physical assistance, From elevated surface ?  ?  ?  ?  ?  ?General transfer comment: Stood x 2 ~10 seconds each time before pt stated he needed to sit. He was able to control descent to bed. Knees did not buckle but standing tolerance appears poor. Heavy reliance on UEs and RW. Lateral scooting along edge of bed to HOB-Min guard. ?  ? ?Ambulation/Gait ?  ?  ?  ?  ?  ?  ?  ?General Gait Details: NT on today for safety reasons/fall risk. May need to consider use of EVA walker?? ? ?Stairs ?  ?  ?  ?  ?  ? ?Wheelchair Mobility ?  ? ?Modified Rankin (Stroke Patients Only) ?  ? ?  ? ?Balance Overall balance assessment: Needs assistance, History of Falls ?  ?Sitting balance-Leahy Scale: Good ?  ?  ?Standing balance support: During functional activity, Reliant on assistive device for balance ?Standing balance-Leahy Scale: Poor ?  ?  ?  ?  ?  ?  ?  ?  ?  ?  ?  ?  ?   ? ? ? ?  Pertinent Vitals/Pain Pain Assessment ?Pain Assessment: Faces ?Faces Pain Scale: Hurts little more ?Pain Location: bilateral pain in feet, LEs, back ?Pain Descriptors / Indicators: Discomfort, Sore ?Pain Intervention(s): Limited activity within patient's tolerance, Monitored during session, Repositioned  ? ? ?Home Living Family/patient expects to be discharged to:: Private  residence ?Living Arrangements: Non-relatives/Friends Chief Technology Officer; other roommates) ?Available Help at Discharge: Friend(s);Available PRN/intermittently ?Type of Home: House ?Home Access: Stairs to enter ?  ?Entrance Stairs-Number of Steps: 1 ?  ?Home Layout: Multi-level ?Home Equipment: Cane - single point;Rollator (4 wheels) ?   ?  ?Prior Function Prior Level of Function : Needs assist ?  ?  ?  ?  ?  ?  ?Mobility Comments: has been using a cane, limited home ambulation for last 3-4 months that has gotten worse in the last 2-3 works ?ADLs Comments: grossly functional ability to perform BADLs with assistance to steady, get in and out of transfer. ?  ? ? ?Hand Dominance  ? Dominant Hand: Right ? ?  ?Extremity/Trunk Assessment  ? Upper Extremity Assessment ?Upper Extremity Assessment: Defer to OT evaluation ?RUE Deficits / Details: WFL ROM, grossly 4+/5 strength ?RUE Sensation: history of peripheral neuropathy ?RUE Coordination: WNL ?LUE Deficits / Details: WFL ROm, grossly 4+/5 strength ?LUE Sensation: WNL ?LUE Coordination: WNL ?  ? ?Lower Extremity Assessment ?Lower Extremity Assessment: RLE deficits/detail;LLE deficits/detail ?RLE Deficits / Details: MMT in bed: DF/PF3-/5, hip flexion 3-/5, knee ext 3-/5. Question regarding effort? ?RLE Sensation: history of peripheral neuropathy;decreased light touch ?RLE Coordination: decreased gross motor;decreased fine motor ?LLE Deficits / Details: MMT in bed: DF/PF 3-/5, hip flexion 3-/5, knee ext 3-/5. Question regarding effort? ?LLE Sensation: history of peripheral neuropathy;decreased light touch ?LLE Coordination: decreased gross motor;decreased fine motor ?  ? ?Cervical / Trunk Assessment ?Cervical / Trunk Assessment: Normal  ?Communication  ? Communication: No difficulties  ?Cognition Arousal/Alertness: Awake/alert ?Behavior During Therapy: [facility] for tasks assessed/performed ?Overall Cognitive Status: Within Functional Limits for tasks assessed ?  ?  ?  ?  ?  ?  ?  ?  ?   ?  ?  ?  ?  ?  ?  ?  ?  ?  ?  ? ?  ?General Comments   ? ?  ?Exercises    ? ?Assessment/Plan  ?  ?PT Assessment Patient needs continued PT services  ?PT Problem List Decreased strength;Decreased mobility;Decreased range of motion;Decreased activity tolerance;Decreased balance;Decreased knowledge of use of DME;Pain ? ?   ?  ?PT Treatment Interventions DME instruction;Gait training;Therapeutic exercise;Balance training;Stair training;Functional mobility training;Therapeutic activities;Patient/family education   ? ?PT Goals (Current goals can be found in the Care Plan section)  ?Acute Rehab PT Goals ?Patient Stated Goal: to regain PLOF/independence ?PT Goal Formulation: With patient ?Time For Goal Achievement: 09/01/21 ?Potential to Achieve Goals: Good ? ?  ?Frequency Min 3X/week ?  ? ? ?Co-evaluation   ?  ?  ?  ?  ? ? ?  ?AM-PAC PT "6 Clicks" Mobility  ?Outcome Measure Help needed turning from your back to your side while in a flat bed without using bedrails?: None ?Help needed moving from lying on your back to sitting on the side of a flat bed without using bedrails?: A Little ?Help needed moving to and from a bed to a chair (including a wheelchair)?: Total ?Help needed standing up from a chair using your arms (e.g., wheelchair or bedside chair)?: A Lot ?Help needed to walk in hospital room?: Total ?Help needed climbing 3-5 steps with a  railing? : Total ?6 Click Score: 12 ? ?  ?End of Session Equipment Utilized During Treatment: Gait belt ?Activity Tolerance: Patient tolerated treatment well;Patient limited by fatigue;Patient limited by pain ?Patient left: in bed;with call bell/phone within reach;with bed alarm set ?  ?PT Visit Diagnosis: Pain;Other abnormalities of gait and mobility (R26.89);History of falling (Z91.81);Muscle weakness (generalized) (M62.81);Other symptoms and signs involving the nervous system (R29.898) ?Pain - part of body:  (bil feet) ?  ? ?Time: 1638-4665 ?PT Time Calculation (min) (ACUTE ONLY):  15 min ? ? ?Charges:   PT Evaluation ?$PT Eval Moderate Complexity: 1 Mod ?  ?  ?   ? ? ? ? ?Christy Friede P, PT ?Acute Rehabilitation  ?Office: [phone] ?Pager: [phone] ? ?  ? ?

## 2021-08-18 NOTE — Assessment & Plan Note (Addendum)
Notes they had issues waking him up on day of admission, unclear, I don't see documentation regarding this -> ? Related to pain meds/AKI.  Will monitor for now. ?

## 2021-08-18 NOTE — Assessment & Plan Note (Addendum)
Notes progressively worsening ambulatory dysfunction since January ?Worse in the past month or so, says he can't walk to bathroom/care for himself ?Hx "bulging disc" on R.  He notes saddle anesthesia for 2 months and urinary incontinence maybe 6 months.  ?4/5 strength to LE's bilaterally L>R ?Plain films with mild anterior wedging of T12, likely developmental ?Lumbar MRI with small L subarticular disc protrusion at L5-S1 ?MRI T and L spine pending  ?Neuropathy probably significant contributor as well.  May need to consult neurology if unrevealing. ?

## 2021-08-18 NOTE — Evaluation (Signed)
Occupational Therapy Evaluation ?Patient Details ?Name: Victor Matthews ?MRN: [id number] ?DOB: 06/02/1985 ?Today's Date: 08/18/2021 ? ? ?History of Present Illness 36 yo with hx T1DY, chronic pain, neuropathy, anxiety, bipolar disorder with recent E Coli bacteremia in early February and COVID infection late February presenting with hyperglycemia, malaise, and ambulatory dysfunction.  ? ?Clinical Impression ?  ?[name] is a 36 year old man who presents with lower extremity weakness and neuropathy, overall generalized weakness, decreased activity tolerance,  and impaired sensation resulting in a decline in functional abilities for the last several months and exacerbated the last two weeks. He has grossly been able to perform ADLs but has assistance for getting in the shower, at times for steadying during transfers and needing people to bring him meals  He reports a history of a right sided bulging disk, being worked up for a spinal stimulator and back injections, and an extensive history with neuropathy. On evaluation he is able to perform bed transfers with increased time and min assist to stand for 10 seconds. He requires +2 assistance for safe attempt at mobility which therapist could not provide during evaluation. He reports +2 physical assistance to get to bathroom with nursing. Patient able to perform UB ADLs in seated position with setup and needing mod-max assist for LB ADLs at seated or bed level positioning. Patient will benefit from skilled OT services while in hospital to improve deficits and learn compensatory strategies as needed in order to be modified independent. Patient very concerned about his weakness and reports that he does live with people - they mostly work or cannot provide the physical assistance he needs currently. Therapist recommends inpatient rehab at discharge due to patient's impairments, age and motivation.  ?   ? ?Recommendations for follow up therapy are one component of a  multi-disciplinary discharge planning process, led by the attending physician.  Recommendations may be updated based on patient status, additional functional criteria and insurance authorization.  ? ?Follow Up Recommendations ? Acute inpatient rehab (3hours/day)  ?  ?Assistance Recommended at Discharge Frequent or constant Supervision/Assistance  ?Patient can return home with the following A lot of help with bathing/dressing/bathroom;Two people to help with walking and/or transfers;Assistance with cooking/housework;Assist for transportation;Help with stairs or ramp for entrance ? ?  ?Functional Status Assessment ? Patient has had a recent decline in their functional status and demonstrates the ability to make significant improvements in function in a reasonable and predictable amount of time.  ?Equipment Recommendations ? Tub/shower bench;BSC/3in1  ?  ?Recommendations for Other Services Rehab consult ? ? ?  ?Precautions / Restrictions Precautions ?Precautions: Fall ?Precaution Comments: frequent falls, hx of neuropathy ?Restrictions ?Weight Bearing Restrictions: No  ? ?  ? ?Mobility Bed Mobility ?Overal bed mobility: Modified Independent ?  ?  ?  ?  ?  ?  ?General bed mobility comments: increased time but no physical assistance needed ?  ? ?Transfers ?Overall transfer level: Needs assistance ?Equipment used: Rolling walker (2 wheels) ?Transfers: Sit to/from Stand ?Sit to Stand: Min assist ?  ?  ?  ?  ?  ?General transfer comment: Min assist to stand at edge of bed for approx 10 seconds with RW. Attempts to transfer/ambulate deferred for safety reasons due to no second assistance ?  ? ?  ?Balance Overall balance assessment: Needs assistance, History of Falls ?Sitting-balance support: No upper extremity supported, Feet supported ?Sitting balance-Leahy Scale: Good ?  ?  ?Standing balance support: During functional activity, Reliant on assistive device for balance ?  Standing balance-Leahy Scale: Poor ?  ?  ?  ?  ?  ?  ?   ?  ?  ?  ?  ?  ?   ? ?ADL either performed or assessed with clinical judgement  ? ?ADL Overall ADL's : Needs assistance/impaired ?Eating/Feeding: Independent ?  ?Grooming: Set up;Sitting ?  ?Upper Body Bathing: Set up;Sitting ?  ?Lower Body Bathing: Sitting/lateral leans;Moderate assistance ?Lower Body Bathing Details (indicate cue type and reason): unable to reach lower legs ?Upper Body Dressing : Set up;Sitting ?  ?Lower Body Dressing: Bed level;Maximal assistance ?  ?Toilet Transfer: +2 for physical assistance ?Toilet Transfer Details (indicate cue type and reason): reports +2 to get to bathroom using cane ?Toileting- Clothing Manipulation and Hygiene: Maximal assistance;Sit to/from stand;+2 for physical assistance ?  ?Tub/ Shower Transfer: Minimal assistance;+2 for physical assistance;Tub bench ?  ?Functional mobility during ADLs: Minimal assistance;Rolling walker (2 wheels) ?   ? ? ? ?Vision Patient Visual Report: No change from baseline ?   ?   ?Perception   ?  ?Praxis   ?  ? ?Pertinent Vitals/Pain Pain Assessment ?Pain Assessment: Faces ?Faces Pain Scale: Hurts little more ?Pain Location: bilateral pain in feet ?Pain Intervention(s): Monitored during session  ? ? ? ?Hand Dominance Right ?  ?Extremity/Trunk Assessment Upper Extremity Assessment ?Upper Extremity Assessment: RUE deficits/detail;LUE deficits/detail ?RUE Deficits / Details: WFL ROM, grossly 4+/5 strength ?RUE Sensation: history of peripheral neuropathy ?RUE Coordination: WNL ?LUE Deficits / Details: WFL ROm, grossly 4+/5 strength ?LUE Sensation: WNL ?LUE Coordination: WNL ?  ?Lower Extremity Assessment ?Lower Extremity Assessment: Defer to PT evaluation ?  ?Cervical / Trunk Assessment ?Cervical / Trunk Assessment: Normal ?  ?Communication Communication ?Communication: No difficulties ?  ?Cognition Arousal/Alertness: Awake/alert ?Behavior During Therapy: [facility] for tasks assessed/performed ?Overall Cognitive Status: Within Functional Limits for tasks  assessed ?  ?  ?  ?  ?  ?  ?  ?  ?  ?  ?  ?  ?  ?  ?  ?  ?  ?  ?  ?General Comments    ? ?  ?Exercises   ?  ?Shoulder Instructions    ? ? ?Home Living Family/patient expects to be discharged to:: Private residence ?Living Arrangements: Non-relatives/Friends Chief Technology Officer and other roommates) ?Available Help at Discharge: Friend(s);Available PRN/intermittently ?Type of Home: House ?Home Access: Stairs to enter ?Entrance Stairs-Number of Steps: 1 ?  ?Home Layout: Multi-level ?  ?Alternate Level Stairs-Rails: Right ?Bathroom Shower/Tub: Tub/shower unit ?  ?Bathroom Toilet: Standard ?  ?  ?Home Equipment: Gilmer Mor - single point ?  ?  ?  ? ?  ?Prior Functioning/Environment Prior Level of Function : Needs assist ?  ?  ?  ?  ?  ?  ?Mobility Comments: has been using a came, limited home ambulation for last 3-4 months that has gotten worse in the last 2-3 works ?ADLs Comments: grossly functional ability to perform BADLs with assistance to steady, get in and out of transfer. ?  ? ?  ?  ?OT Problem List: Decreased strength;Decreased range of motion;Decreased activity tolerance;Impaired balance (sitting and/or standing);Decreased knowledge of use of DME or AE;Decreased coordination;Impaired sensation ?  ?   ?OT Treatment/Interventions: Self-care/ADL training;Therapeutic exercise;DME and/or AE instruction;Therapeutic activities;Patient/family education;Balance training;Neuromuscular education  ?  ?OT Goals(Current goals can be found in the care plan section) Acute Rehab OT Goals ?Patient Stated Goal: to get therapy and get stronger ?OT Goal Formulation: With patient ?Time For Goal Achievement: 09/01/21 ?Potential to Achieve  Goals: Good  ?OT Frequency: Min 2X/week ?  ? ?Co-evaluation   ?  ?  ?  ?  ? ?  ?AM-PAC OT "6 Clicks" Daily Activity     ?Outcome Measure Help from another person eating meals?: None ?Help from another person taking care of personal grooming?: A Little ?Help from another person toileting, which includes using  toliet, bedpan, or urinal?: A Lot ?Help from another person bathing (including washing, rinsing, drying)?: A Lot ?Help from another person to put on and taking off regular upper body clothing?: A Little ?Help

## 2021-08-18 NOTE — Assessment & Plan Note (Signed)
2/27, continued ageusia  ?Feels like he's declined since COVID infection ?

## 2021-08-18 NOTE — Progress Notes (Addendum)
Inpatient Diabetes Program Recommendations ? ?AACE/ADA: New Consensus Statement on Inpatient Glycemic Control (2015) ? ?Target Ranges:  Prepandial:   less than 140 mg/dL ?     Peak postprandial:   less than 180 mg/dL (1-2 hours) ?     Critically ill patients:  140 - 180 mg/dL  ? ?Lab Results  ?Component Value Date  ? GLUCAP 144 (H) 08/18/2021  ? ? ?Review of Glycemic Control ? ?Diabetes history: DM1 ?Outpatient Diabetes medications: Levemir 35 units QD, 70/30 20 units TID ?Current orders for Inpatient glycemic control: Semglee 30 QD, Novolog 0-15 units TID with meals and 0-5 HS ? ?Has appt with new Endo on 09/01/21 ? ?Inpatient Diabetes Program Recommendations:   ? ?Add Novolog 8 units TID with meals if eating > 50% meal ? ?Need updated HgbA1C ? ?Will speak with pt at bedside later this am.  ? ?Thank you. ?Ailene Ards, RD, LDN, CDE ?Inpatient Diabetes Coordinator ?[phone]  ? ?Addendum: ?Spoke with pt at bedside regarding his diabetes meds. Pt states he takes Levemir 40 units QD and Humalog 35 units TID. Confirms Endo appt on 4/19. At present, pt's PCP in [place] is managing his diabetes. Has no problems with getting meds or supplies. Pt states HgbA1C is always high, no matter what he does. Checks blood sugars throughout the day. Received 30 units of Semglee this am after insulin drip was d/ced. MD ordered 8 units TID. Will likely need titration. Follow closely. RV ? ? ? ? ? ? ?

## 2021-08-18 NOTE — Assessment & Plan Note (Signed)
Peripheral neuropathy from DM, likely contributing to ambulatory dysfunction ?

## 2021-08-18 NOTE — Progress Notes (Signed)
Pt CBG 404.  ?Attending MD notified.  ?RN to give max sliding scale plus standing units per orders. Total of 35 units given.  ?Random Glucose Verification ordered per protocol.  ?No additional orders at this time.  ?Will continue to monitor.  ?

## 2021-08-18 NOTE — TOC Initial Note (Signed)
Transition of Care (TOC) - Initial/Assessment Note  ? ? ?Patient Details  ?Name: Victor Matthews ?MRN: [id number] ?Date of Birth: 1986-01-02 ? ?Transition of Care (TOC) CM/SW Contact:    ?Victor Rogue, LCSW ?Phone Number: ?08/18/2021, 4:06 PM ? ?Clinical Narrative:   Patient seen in follow up to MD consult for PCP, medication needs, and PT recommendation for CIR.  Victor Matthews lives here in [place] with his godfather, who has made it clear he cannot continue to stay there unless there are services in place and he is in better shape than when he came into the hospital.  Victor Matthews tells me that he currently is unable to ambulate, this has not happened before, and he has not been told what is causing this.  However, since CIR will not be able to service patient, he states he is open to going to SNF. I let him know that, given his age, this may be a challenge.  And will also need insurance approval.  He needs a PCP, and is open to seeing someone at [facility], though I also told him he should be linked to a provider through his MCD, which he says he just got in [place] as he moved here from [place] in the past several months. He reports he gets both insurances through disability, which is both medical and mental health.  He is linked with a therapist who is linking him with a psychiatrist.  Bed search initiated. TOC will continue to follow during the course of hospitalization. ?           ? ? ?Expected Discharge Plan: Skilled Nursing Facility ?Barriers to Discharge: SNF Pending bed offer ? ? ?Patient Goals and CMS Choice ?Patient states their goals for this hospitalization and ongoing recovery are:: "I can't walk, and I can't return home." ?CMS Medicare.gov Compare Post Acute Care list provided to:: Patient ?Choice offered to / list presented to : Patient ? ?Expected Discharge Plan and Services ?Expected Discharge Plan: Skilled Nursing Facility ?  ?Discharge Planning Services: CM Consult ?Post Acute Care Choice: Skilled  Nursing Facility ?Living arrangements for the past 2 months: Single Family Home ?                ?  ?  ?  ?  ?  ?  ?  ?  ?  ?  ? ?Prior Living Arrangements/Services ?Living arrangements for the past 2 months: Single Family Home ?Lives with:: Relatives ?Patient language and need for interpreter reviewed:: Yes ?       ?Need for Family Participation in Patient Care: Yes (Comment) ?Care giver support system in place?: Yes (comment) ?  ?Criminal Activity/Legal Involvement Pertinent to Current Situation/Hospitalization: No - Comment as needed ? ?Activities of Daily Living ?Home Assistive Devices/Equipment: None ?ADL Screening (condition at time of admission) ?Patient's cognitive ability adequate to safely complete daily activities?: Yes ?Is the patient deaf or have difficulty hearing?: No ?Does the patient have difficulty seeing, even when wearing glasses/contacts?: No ?Does the patient have difficulty concentrating, remembering, or making decisions?: No ?Patient able to express need for assistance with ADLs?: Yes ?Does the patient have difficulty dressing or bathing?: No ?Independently performs ADLs?: Yes (appropriate for developmental age) ?Does the patient have difficulty walking or climbing stairs?: Yes ?Weakness of Legs: Both ?Weakness of Arms/Hands: None ? ?Permission Sought/Granted ?  ?  ?   ?   ?   ?   ? ?Emotional Assessment ?Appearance:: Appears stated age ?Attitude/Demeanor/Rapport: Engaged ?  Affect (typically observed): Appropriate ?Orientation: : Oriented to Self, Oriented to Place, Oriented to  Time, Oriented to Situation ?Alcohol / Substance Use: Not Applicable ?Psych Involvement: Yes (comment) ? ?Admission diagnosis:  Hyperglycemia [R73.9] ?Patient Active Problem List  ? Diagnosis Date Noted  ? Ambulatory dysfunction 08/18/2021  ? Neuropathy 08/18/2021  ? History of bacteremia 08/18/2021  ? History of COVID-19 08/18/2021  ? Unresponsive episode 08/18/2021  ? Hyperglycemia 08/17/2021  ? Chronic pain syndrome  08/17/2021  ? ?PCP:  Provider, Generic External Data ?Pharmacy:   ?[facility] ?[address], [address] ?[place] [address] ?Phone: [phone] Fax: [phone] ? ?[facility], [place] - [address]  [facility] ?[address] ?[facility] [place] [address] ?Phone: [phone] Fax: [phone] ? ? ? ? ?Social Determinants of Health (SDOH) Interventions ?  ? ?Readmission Risk Interventions ?   ? View : No data to display.  ?  ?  ?  ? ? ? ?

## 2021-08-18 NOTE — Progress Notes (Signed)
Lunch time CBG 400.  ?

## 2021-08-18 NOTE — Plan of Care (Signed)
?  Problem: Education: ?Goal: Knowledge of General Education information will improve ?Description: Including pain rating scale, medication(s)/side effects and non-pharmacologic comfort measures ?Outcome: Progressing ?  ?Problem: Clinical Measurements: ?Goal: Respiratory complications will improve ?Outcome: Progressing ?Goal: Cardiovascular complication will be avoided ?Outcome: Progressing ?  ?Problem: Nutrition: ?Goal: Adequate nutrition will be maintained ?Outcome: Progressing ?  ?Problem: Coping: ?Goal: Level of anxiety will decrease ?Outcome: Progressing ?  ?Problem: Elimination: ?Goal: Will not experience complications related to bowel motility ?Outcome: Progressing ?Goal: Will not experience complications related to urinary retention ?Outcome: Progressing ?  ?

## 2021-08-18 NOTE — Progress Notes (Signed)
?PROGRESS NOTE ? ? ? ?Victor Matthews  IWL:[id number] DOB: 26-Oct-1985 DOA: 08/17/2021 ?PCP: Provider, Generic External Data  ?Chief Complaint  ?Patient presents with  ? Hyperglycemia  ? ? ?Brief Narrative:  ?36 yo with hx T1DY, chronic pain, neuropathy, anxiety, bipolar disorder with recent E Coli bacteremia in early February and COVID infection late February presenting with hyperglycemia, malaise, and ambulatory dysfunction.   ? ? ?Assessment & Plan: ?  ?Principal Problem: ?  Hyperglycemia ?Active Problems: ?  Ambulatory dysfunction ?  Chronic pain syndrome ?  Neuropathy ?  Unresponsive episode ?  History of bacteremia ?  History of COVID-19 ? ? ?Assessment and Plan: ?* Hyperglycemia ?Transitioned to subcutaneous insulin today ?He's on 40 units levemir at home and 30 units humalog TID ac (it says 70/30 in his med rec, which doesn't seem right) ?Start 30 units basal with SSI today ? ?Ambulatory dysfunction ?Notes progressively worsening ambulatory dysfunction since January ?Worse in the past month or so, says he can't walk to bathroom/care for himself ?Hx "bulging disc" on R, he notes saddle anesthesia for past 2 months, notes urinary incontinence ?4/5 strength to LE's bilaterally L>R ?Plain films, MRI lumbar spine ?Neuropathy probably significant contributor as well ? ?Chronic pain syndrome ?Pt seeing pain management for chronic BLE pain. ?Did verify with PMP aware that pt does take: ?Percocet 7.5 mg TID PRN ?MS Contin 15 mg BID scheduled ?Lyrica 300 mg PO TID ?He understands need to get refills for chronic pain meds from primary prescriber ? ?Unresponsive episode ?Notes they had issues waking him up yesterday, unclear, I don't see documentation regarding this -> ? Related to pain meds/AKI.  Will monitor for now. ? ?Neuropathy ?Peripheral neuropathy from DM, likely contributing to ambulatory dysfunction ? ?History of bacteremia ?E coli in 2/10 blood cx ?Treated and blood cx since negative ? ?History of COVID-19 ?2/27,  continued ageusia  ?Feels like he's declined since COVID infection ? ? ?DVT prophylaxis: lovenox ?Code Status: full ?Family Communication: none  ?Disposition:  ? ?Status is: Observation ?The patient remains OBS appropriate and will d/c before 2 midnights. ?  ?Consultants:  ?none ? ?Procedures:  ?none ? ?Antimicrobials:  ?Anti-infectives (From admission, onward)  ? ? None  ? ?  ? ? ?Subjective: ?List of questions that we address at bedside ?Concern about period of unresponsiveness, difficulty ambulating, inability to care for himself ? ?Objective: ?Vitals:  ? 08/18/21 0200 08/18/21 0300 08/18/21 0348 08/18/21 0400  ?BP: 139/78   (!) 155/96  ?Pulse: 85 90  84  ?Resp: 20 15  18   ?Temp:   98.2 ?F (36.8 ?C)   ?TempSrc:   Oral   ?SpO2: 99% 96%  99%  ?Weight:      ?Height:      ? ? ?Intake/Output Summary (Last 24 hours) at 08/18/2021 0949 ?Last data filed at 08/18/2021 0522 ?Gross per 24 hour  ?Intake 2608 ml  ?Output 900 ml  ?Net 1708 ml  ? ?Filed Weights  ? 08/17/21 1355 08/17/21 1825  ?Weight: 99.3 kg 98.4 kg  ? ? ?Examination: ? ?General exam: Appears calm and comfortable  ?Respiratory system: unlabored ?Cardiovascular system: RRR ?Central nervous system: 4/5 strength to lower extremities bilaterally (greater strength on L), + saddle anesthesia ?Extremities: no LEE ?Skin: No rashes, lesions or ulcers ?Psychiatry: anxious, has list of questions/concerns written out ? ? ? ?Data Reviewed: I have personally reviewed following labs and imaging studies ? ?CBC: ?Recent Labs  ?Lab 08/17/21 ?1411 08/17/21 ?1452 08/18/21 ?0243  ?WBC 5.2  --  5.1  ?NEUTROABS 3.7  --   --   ?HGB 11.4* 13.6 10.9*  ?HCT 33.8* 40.0 31.7*  ?MCV 80.3  --  80.1  ?PLT 377  --  363  ? ? ?Basic Metabolic Panel: ?Recent Labs  ?Lab 08/17/21 ?1411 08/17/21 ?1452 08/17/21 ?1846 08/18/21 ?0243  ?NA 125* 125* 130* 136  ?K 4.6 5.6* 4.1 3.3*  ?CL 86*  --  95* 100  ?CO2 27  --  24 27  ?GLUCOSE 886*  --  399* 187*  ?BUN 11  --  11 10  ?CREATININE 1.34*  --  1.27* 0.94   ?CALCIUM 9.3  --  9.6 9.1  ?MG 2.2  --   --   --   ?PHOS 4.0  --   --   --   ? ? ?GFR: ?Estimated Creatinine Clearance: 134.5 mL/min (by C-G formula based on SCr of 0.94 mg/dL). ? ?Liver Function Tests: ?Recent Labs  ?Lab 08/17/21 ?1411  ?AST 19  ?ALT 10  ?ALKPHOS 147*  ?BILITOT 0.5  ?PROT 8.7*  ?ALBUMIN 3.1*  ? ? ?CBG: ?Recent Labs  ?Lab 08/18/21 ?0341 08/18/21 ?0442 08/18/21 ?1610 08/18/21 ?9604 08/18/21 ?0700  ?GLUCAP 163* 151* 168* 166* 144*  ? ? ? ?Recent Results (from the past 240 hour(s))  ?MRSA Next Gen by PCR, Nasal     Status: None  ? Collection Time: 08/17/21  6:18 PM  ? Specimen: Nasal Mucosa; Nasal Swab  ?Result Value Ref Range Status  ? MRSA by PCR Next Gen NOT DETECTED NOT DETECTED Final  ?  Comment: (NOTE) ?The GeneXpert MRSA Assay (FDA approved for NASAL specimens only), ?is one component of [facility] comprehensive MRSA colonization surveillance ?program. It is not intended to diagnose MRSA infection nor to guide ?or monitor treatment for MRSA infections. ?Test performance is not FDA approved in patients less than 2 years ?old. ?Performed at [facility], [address] ?[place], [place] [address] ?  ?  ? ? ? ? ? ?Radiology Studies: ?DG Chest Portable 1 View ? ?Result Date: 08/17/2021 ?CLINICAL DATA:  Recent virus illness EXAM: PORTABLE CHEST 1 VIEW COMPARISON:  None. FINDINGS: Transverse diameter of heart is slightly increased. There are no signs of pulmonary edema or focal pulmonary consolidation. There is no pleural effusion or pneumothorax. IMPRESSION: No active disease. Electronically Signed   By: Ernie Avena M.D.   On: 08/17/2021 14:24   ? ? ? ? ? ?Scheduled Meds: ? Chlorhexidine Gluconate Cloth  6 each Topical Daily  ? enoxaparin (LOVENOX) injection  40 mg Subcutaneous Q24H  ? insulin aspart  0-15 Units Subcutaneous TID WC  ? insulin aspart  0-5 Units Subcutaneous QHS  ? insulin aspart  8 Units Subcutaneous TID WC  ? insulin glargine-yfgn  20 Units Subcutaneous Once  ?  [START ON 08/19/2021] insulin glargine-yfgn  30 Units Subcutaneous Daily  ? morphine  15 mg Oral Q12H  ? pregabalin  300 mg Oral TID  ? sodium chloride flush  3 mL Intravenous Q12H  ? ?Continuous Infusions: ? sodium chloride    ? ? ? LOS: 0 days  ? ? ?Time spent: over 30 min ? ? ? ?Lacretia Nicks, MD ?[facility] ? ? ?To contact the attending provider between 7A-7P or the covering provider during after hours 7P-7A, please log into the web site [email or web address] and access using universal [place] password for that web site. If you do not have the password, please call the hospital operator. ? ?08/18/2021, 9:49 AM  ? ? ?

## 2021-08-19 ENCOUNTER — Inpatient Hospital Stay (HOSPITAL_COMMUNITY): Payer: Medicare Other

## 2021-08-19 ENCOUNTER — Observation Stay (HOSPITAL_COMMUNITY): Payer: Medicare Other

## 2021-08-19 DIAGNOSIS — D649 Anemia, unspecified: Secondary | ICD-10-CM | POA: Diagnosis present

## 2021-08-19 DIAGNOSIS — D72829 Elevated white blood cell count, unspecified: Secondary | ICD-10-CM | POA: Diagnosis not present

## 2021-08-19 DIAGNOSIS — N179 Acute kidney failure, unspecified: Secondary | ICD-10-CM | POA: Diagnosis present

## 2021-08-19 DIAGNOSIS — R93429 Abnormal radiologic findings on diagnostic imaging of unspecified kidney: Secondary | ICD-10-CM

## 2021-08-19 DIAGNOSIS — T50995A Adverse effect of other drugs, medicaments and biological substances, initial encounter: Secondary | ICD-10-CM | POA: Diagnosis present

## 2021-08-19 DIAGNOSIS — F17210 Nicotine dependence, cigarettes, uncomplicated: Secondary | ICD-10-CM | POA: Diagnosis present

## 2021-08-19 DIAGNOSIS — R739 Hyperglycemia, unspecified: Secondary | ICD-10-CM | POA: Diagnosis present

## 2021-08-19 DIAGNOSIS — S0990XA Unspecified injury of head, initial encounter: Secondary | ICD-10-CM | POA: Diagnosis present

## 2021-08-19 DIAGNOSIS — R200 Anesthesia of skin: Secondary | ICD-10-CM

## 2021-08-19 DIAGNOSIS — E1065 Type 1 diabetes mellitus with hyperglycemia: Secondary | ICD-10-CM | POA: Diagnosis present

## 2021-08-19 DIAGNOSIS — F319 Bipolar disorder, unspecified: Secondary | ICD-10-CM | POA: Diagnosis present

## 2021-08-19 DIAGNOSIS — R432 Parageusia: Secondary | ICD-10-CM | POA: Diagnosis present

## 2021-08-19 DIAGNOSIS — R404 Transient alteration of awareness: Secondary | ICD-10-CM | POA: Diagnosis present

## 2021-08-19 DIAGNOSIS — R262 Difficulty in walking, not elsewhere classified: Secondary | ICD-10-CM | POA: Diagnosis present

## 2021-08-19 DIAGNOSIS — G894 Chronic pain syndrome: Secondary | ICD-10-CM | POA: Diagnosis present

## 2021-08-19 DIAGNOSIS — E1165 Type 2 diabetes mellitus with hyperglycemia: Secondary | ICD-10-CM | POA: Diagnosis not present

## 2021-08-19 DIAGNOSIS — E860 Dehydration: Secondary | ICD-10-CM | POA: Diagnosis present

## 2021-08-19 DIAGNOSIS — E1140 Type 2 diabetes mellitus with diabetic neuropathy, unspecified: Secondary | ICD-10-CM | POA: Diagnosis not present

## 2021-08-19 DIAGNOSIS — W182XXA Fall in (into) shower or empty bathtub, initial encounter: Secondary | ICD-10-CM | POA: Diagnosis not present

## 2021-08-19 DIAGNOSIS — Z5329 Procedure and treatment not carried out because of patient's decision for other reasons: Secondary | ICD-10-CM | POA: Diagnosis not present

## 2021-08-19 DIAGNOSIS — F419 Anxiety disorder, unspecified: Secondary | ICD-10-CM | POA: Diagnosis present

## 2021-08-19 DIAGNOSIS — R000 Tachycardia, unspecified: Secondary | ICD-10-CM | POA: Diagnosis not present

## 2021-08-19 DIAGNOSIS — Z794 Long term (current) use of insulin: Secondary | ICD-10-CM | POA: Diagnosis not present

## 2021-08-19 DIAGNOSIS — Z79899 Other long term (current) drug therapy: Secondary | ICD-10-CM | POA: Diagnosis not present

## 2021-08-19 DIAGNOSIS — E1042 Type 1 diabetes mellitus with diabetic polyneuropathy: Secondary | ICD-10-CM | POA: Diagnosis present

## 2021-08-19 DIAGNOSIS — Z87440 Personal history of urinary (tract) infections: Secondary | ICD-10-CM | POA: Diagnosis not present

## 2021-08-19 DIAGNOSIS — Z8616 Personal history of COVID-19: Secondary | ICD-10-CM | POA: Diagnosis not present

## 2021-08-19 LAB — BASIC METABOLIC PANEL
Anion gap: 8 (ref 5–15)
BUN: 14 mg/dL (ref 6–20)
CO2: 27 mmol/L (ref 22–32)
Calcium: 8.8 mg/dL — ABNORMAL LOW (ref 8.9–10.3)
Chloride: 100 mmol/L (ref 98–111)
Creatinine, Ser: 1.26 mg/dL — ABNORMAL HIGH (ref 0.61–1.24)
GFR, Estimated: >60 mL/min (ref >60)
Glucose, Bld: 278 mg/dL — ABNORMAL HIGH (ref 70–99)
Potassium: 4.6 mmol/L (ref 3.5–5.1)
Sodium: 135 mmol/L (ref 135–145)

## 2021-08-19 LAB — GLUCOSE, CAPILLARY
Glucose-Capillary: 404 mg/dL — ABNORMAL HIGH (ref 70–99)
Glucose-Capillary: 271 mg/dL — ABNORMAL HIGH (ref 70–99)
Glucose-Capillary: 198 mg/dL — ABNORMAL HIGH (ref 70–99)

## 2021-08-19 LAB — HEMOGLOBIN A1C
Hgb A1c MFr Bld: 14.6 % — ABNORMAL HIGH (ref 4.8–5.6)
Mean Plasma Glucose: 372 mg/dL

## 2021-08-19 IMAGING — DX DG THORACIC SPINE 2V
2 series · 2 of 2 positions shown · non-contrast
Comparison: Lumbar MRI in lumbar spine radiograph [DATE]

CLINICAL DATA: Pain in cervical and thoracic spine

EXAM:
THORACIC SPINE 2 VIEWS

[t-spine ap]
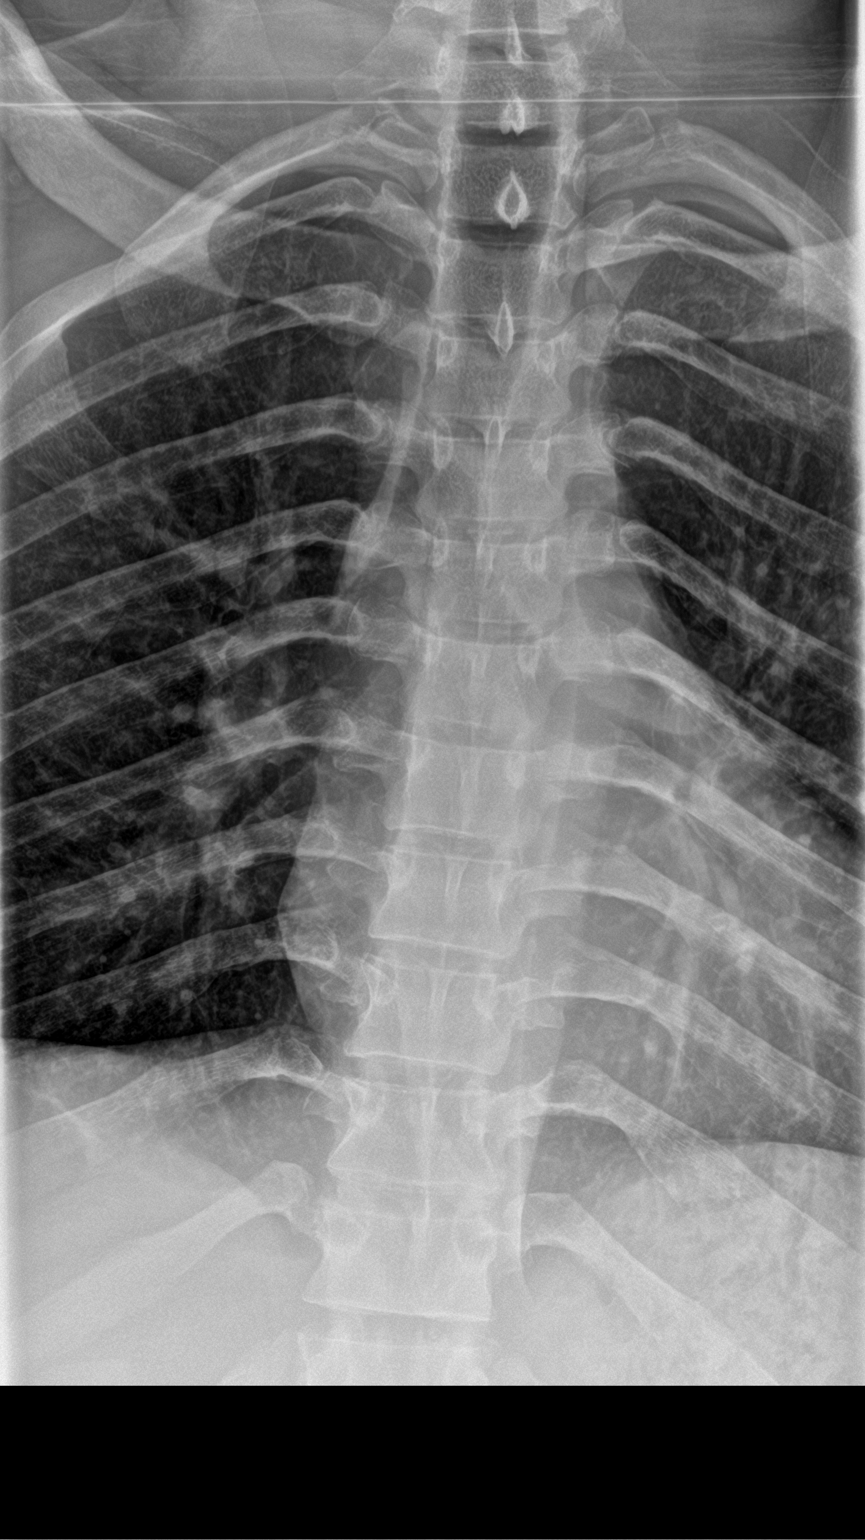

[t-spine lat]
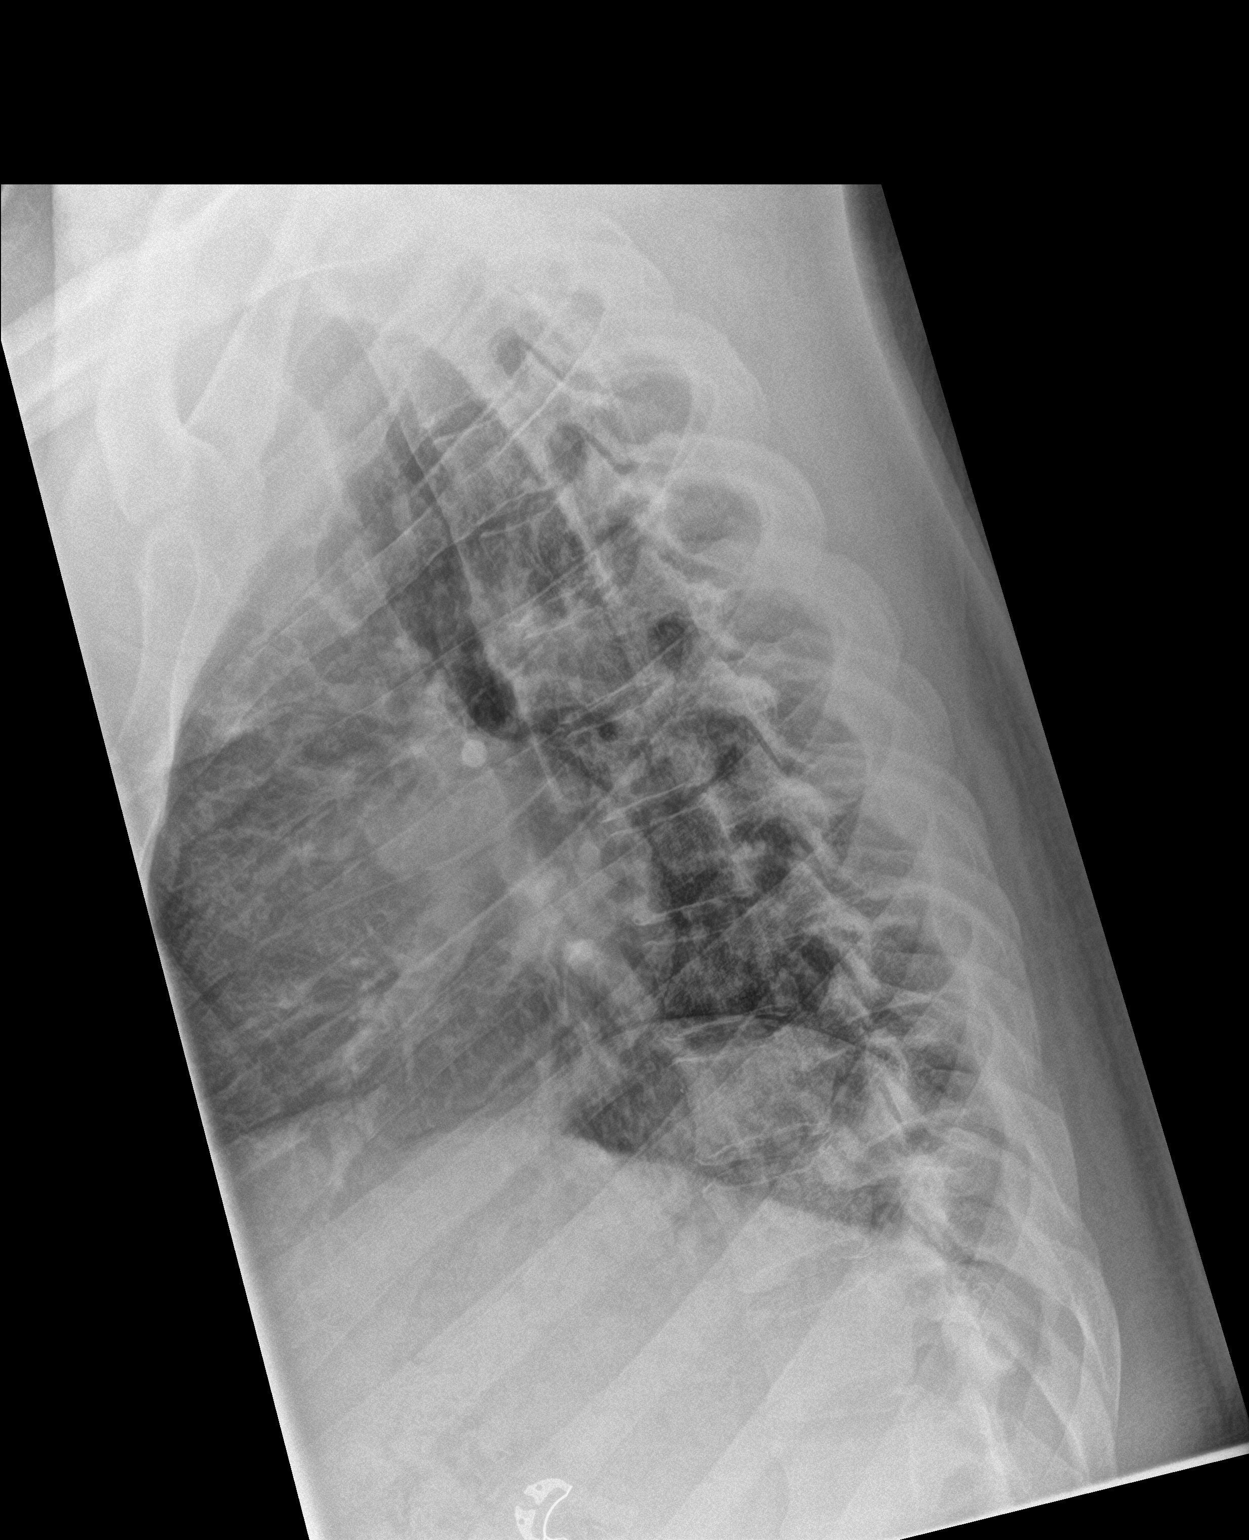

[2 of 2 positions shown; findings below may reference images not displayed]

FINDINGS: Very mild decreased height of the T12 vertebral body is likely
developmental. No evidence of fracture or edema on MRI. Remaining
vertebral bodies in the thoracic spine are normal. Normal alignment.
Disc spaces intact. No significant degenerative change.
IMPRESSION: Negative

## 2021-08-19 IMAGING — MR MR THORACIC SPINE WO/W CM
6 of 8 series · 36 of 48 positions shown · IV contrast (gadavist)
Comparison: None.

CLINICAL DATA: Mid back pain and neck pain

EXAM:
MRI CERVICAL AND THORACIC SPINE WITHOUT AND WITH CONTRAST
TECHNIQUE: Multiplanar and multiecho pulse sequences of the cervical spine, to
include the craniocervical junction and cervicothoracic junction,
and the thoracic spine, were obtained without and with intravenous
contrast.
CONTRAST:  10mL GADAVIST GADOBUTROL 1 MMOL/ML IV SOLN

[Series 16: STIR · sagittal · 3.0mm · 1.06mm/px · 6 of 15 slices shown]
[im 1/15]
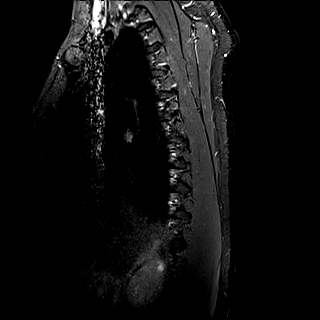
[im 3/15]
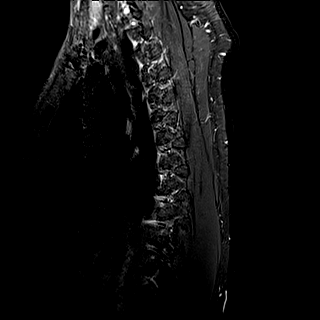
[im 6/15]
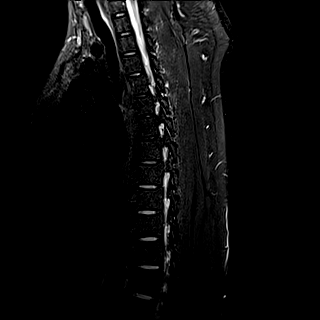
[im 9/15]
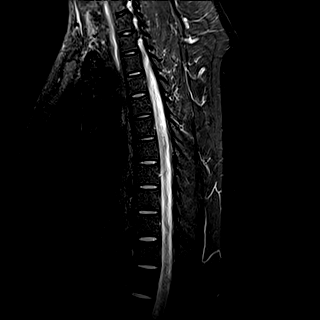
[im 12/15]
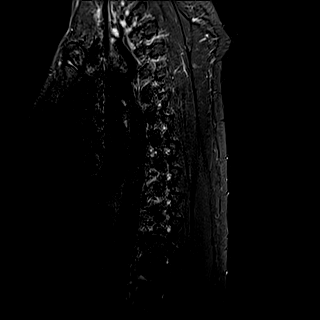
[im 15/15]
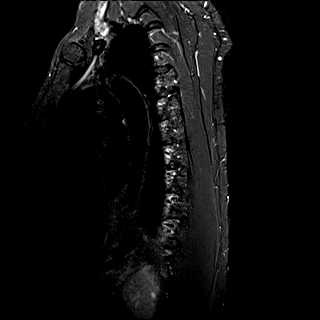

[Series 17: T1 · sagittal · 3.0mm · 1.06mm/px · 6 of 15 slices shown (1 of 2)]
[im 1/15]
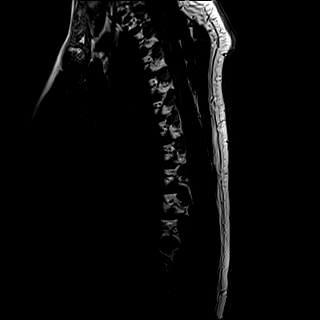
[im 3/15]
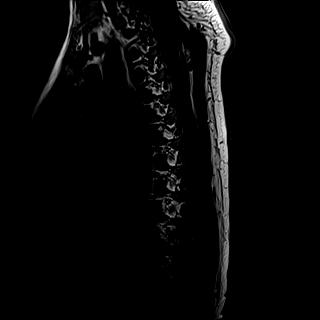
[im 6/15]
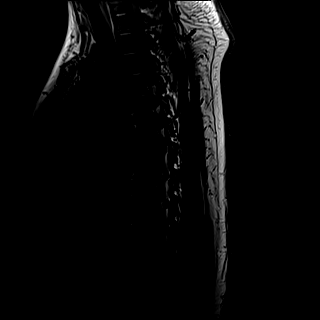
[im 9/15]
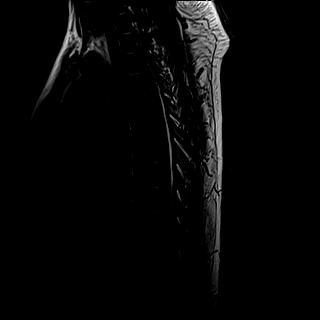
[im 12/15]
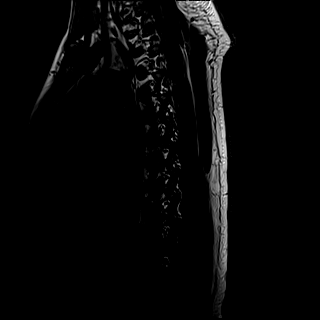
[im 15/15]
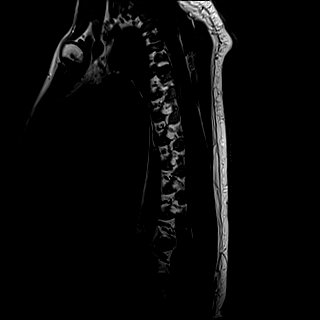

[Series 18: T2 · axial · 4.0mm · 0.78mm/px · z∈[-279,-21]mm · 6 of 15 slices shown]
[im 1/15]
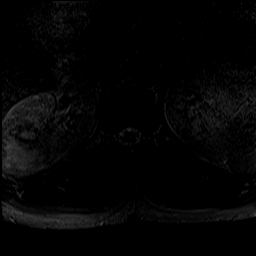
[im 3/15]
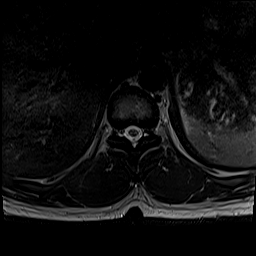
[im 6/15]
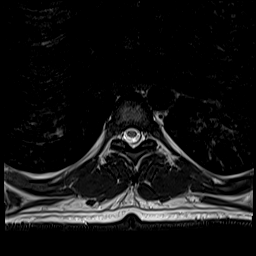
[im 9/15]
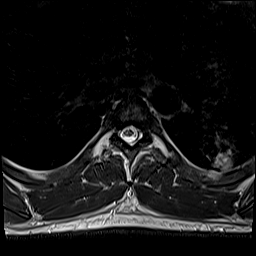
[im 12/15]
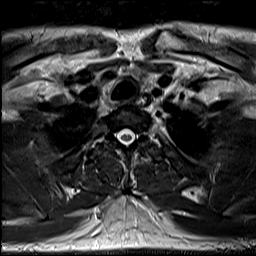
[im 15/15]
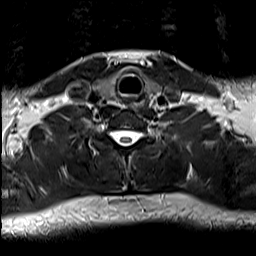

[Series 20: T1 · axial · 4.0mm · 0.39mm/px · z∈[-279,-21]mm · 6 of 15 slices shown (2 of 2)]
[im 1/15]
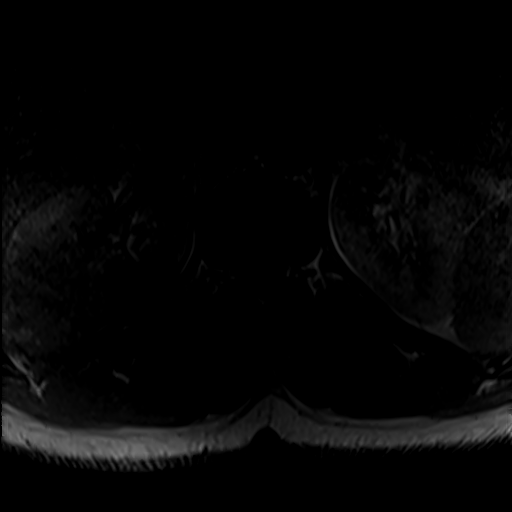
[im 3/15]
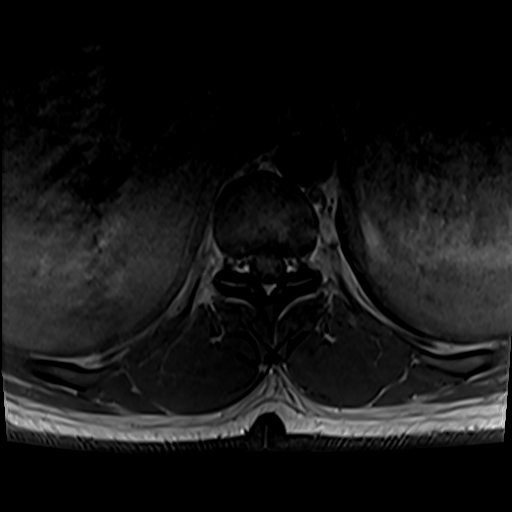
[im 6/15]
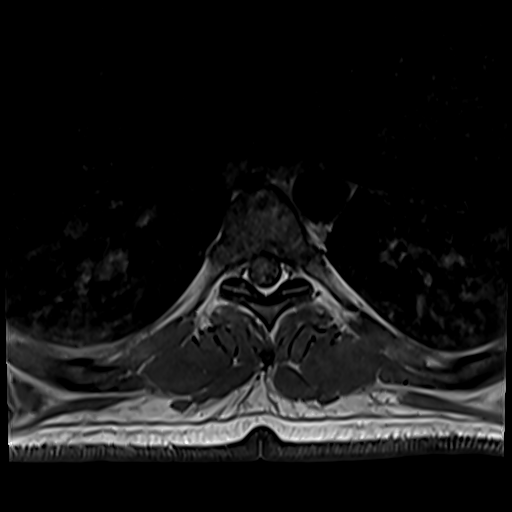
[im 9/15]
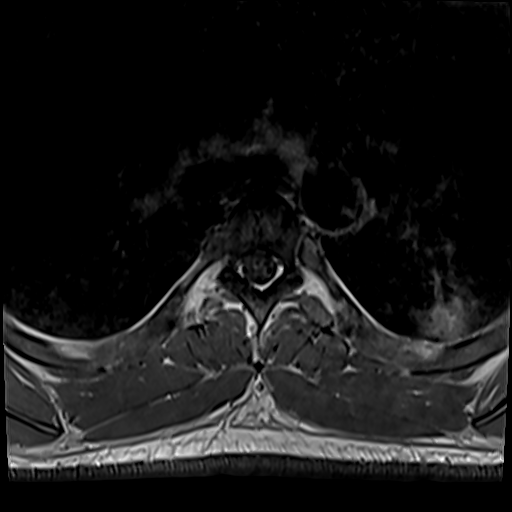
[im 12/15]
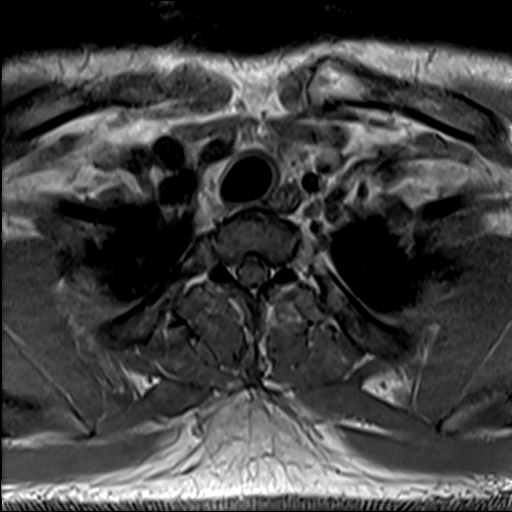
[im 15/15]
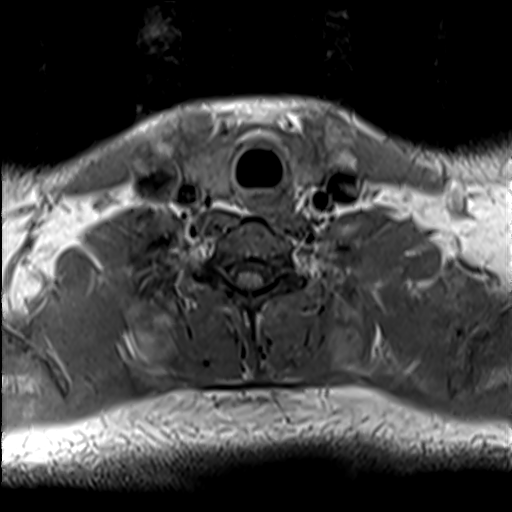

[Series 21: T2 post-contrast · sagittal · 3.0mm · 0.89mm/px · 6 of 15 slices shown]
[im 1/15]
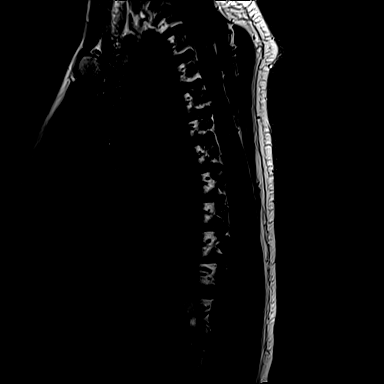
[im 3/15]
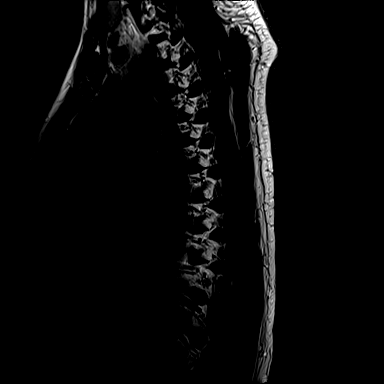
[im 6/15]
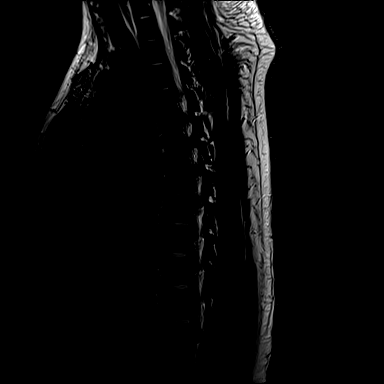
[im 9/15]
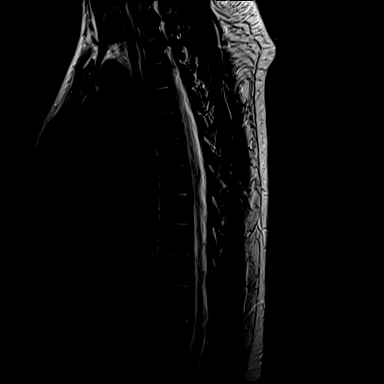
[im 12/15]
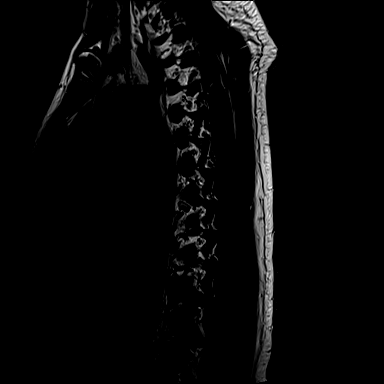
[im 15/15]
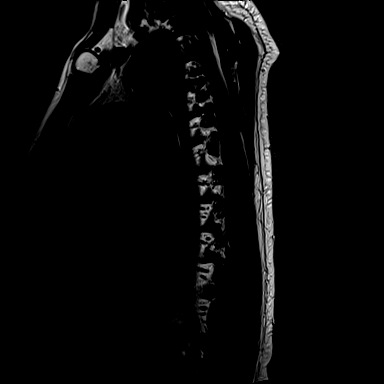

[Series 22: T1 fat-sat post-contrast · sagittal · 3.0mm · 1.06mm/px · 6 of 15 slices shown]
[im 1/15]
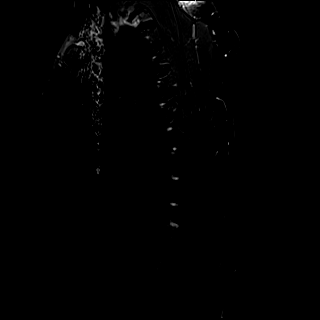
[im 3/15]
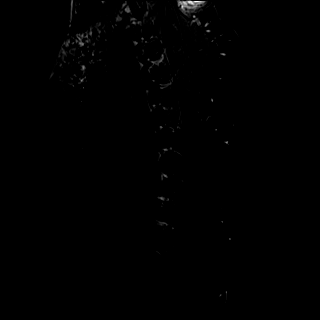
[im 6/15]
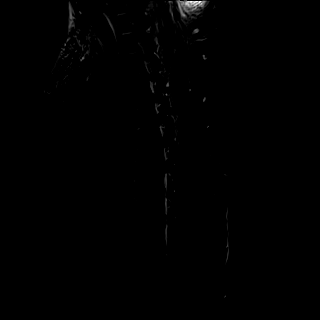
[im 9/15]
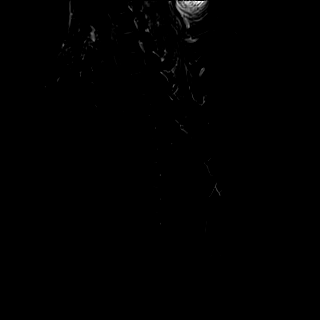
[im 12/15]
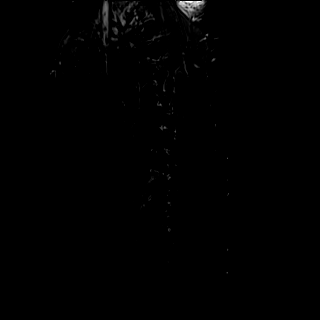
[im 15/15]
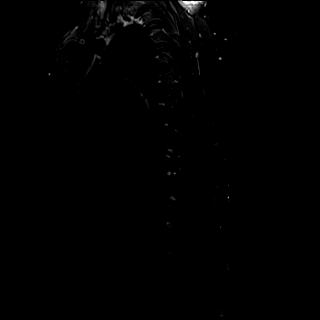

[36 of 48 positions shown; findings below may reference images not displayed]

FINDINGS: MRI CERVICAL SPINE FINDINGS

Alignment: Physiologic.

Vertebrae: No fracture, evidence of discitis, or bone lesion.

Cord: Normal signal and morphology.

Posterior Fossa, vertebral arteries, paraspinal tissues: Negative.

Disc levels:

No spinal canal or neural foraminal stenosis.

MRI THORACIC SPINE FINDINGS

Alignment:  Physiologic.

Vertebrae: No fracture, evidence of discitis, or bone lesion.

Cord:  Normal signal and morphology.

Paraspinal and other soft tissues: Focal opacity in the left mid
lung, incompletely visualized.

Disc levels:

No spinal canal or neural foraminal stenosis.

No abnormal contrast enhancement within the cervical or thoracic
spine.
IMPRESSION: 1. Normal MRI of the cervical and thoracic spine.
2. Focal opacity in the left mid lung, incompletely visualized.
Consider correlation with chest CT without contrast.

## 2021-08-19 IMAGING — DX DG CERVICAL SPINE 2 OR 3 VIEWS
4 series · 4 of 4 positions shown · non-contrast
Comparison: None.

CLINICAL DATA: Neck pain

EXAM:
CERVICAL SPINE - 2-3 VIEW

[c-spine lat]
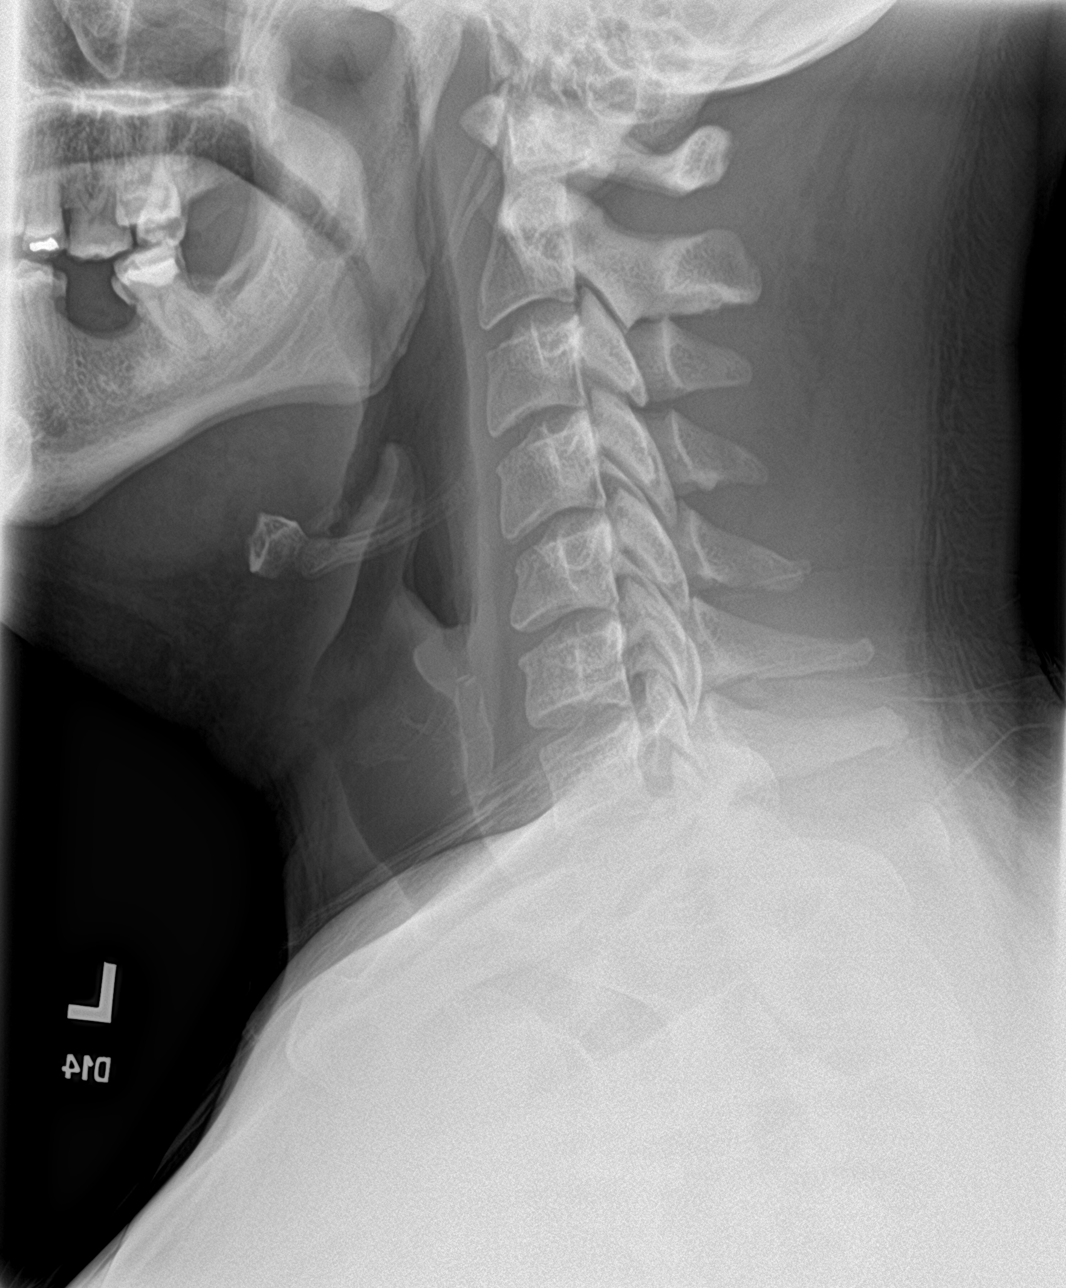

[c-spine ap]
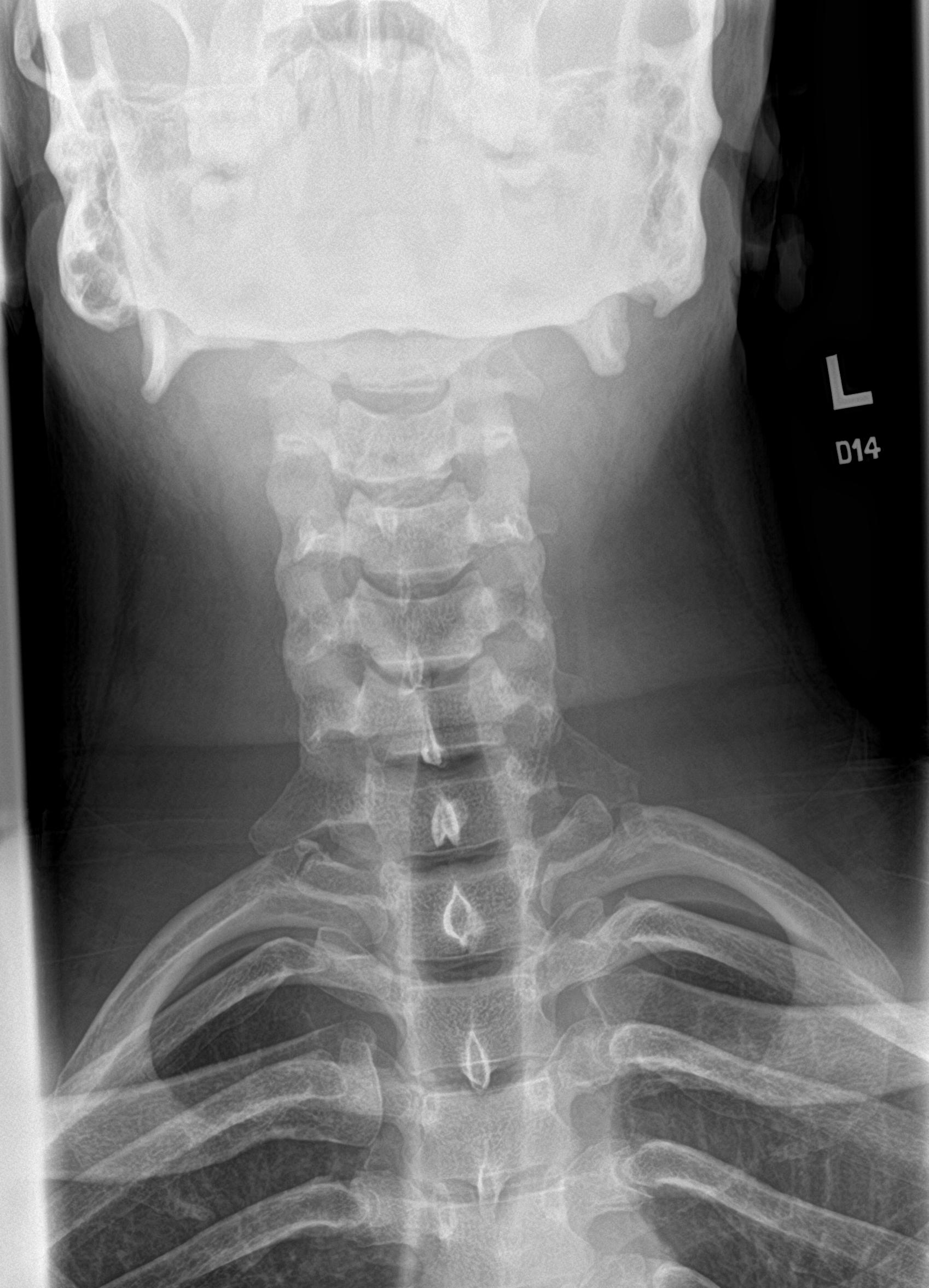

[c-spine open mouth]
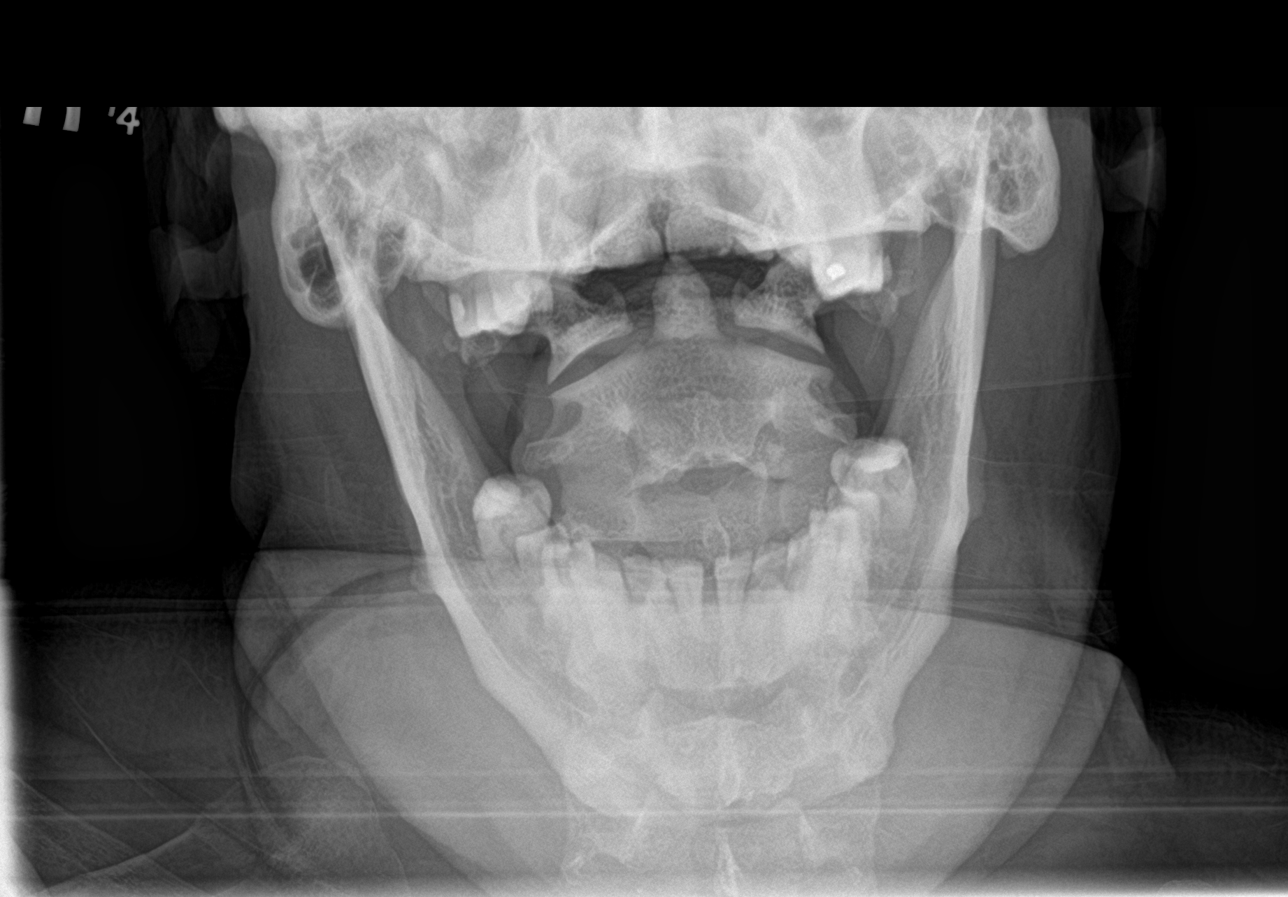

[c-spine swimmers]
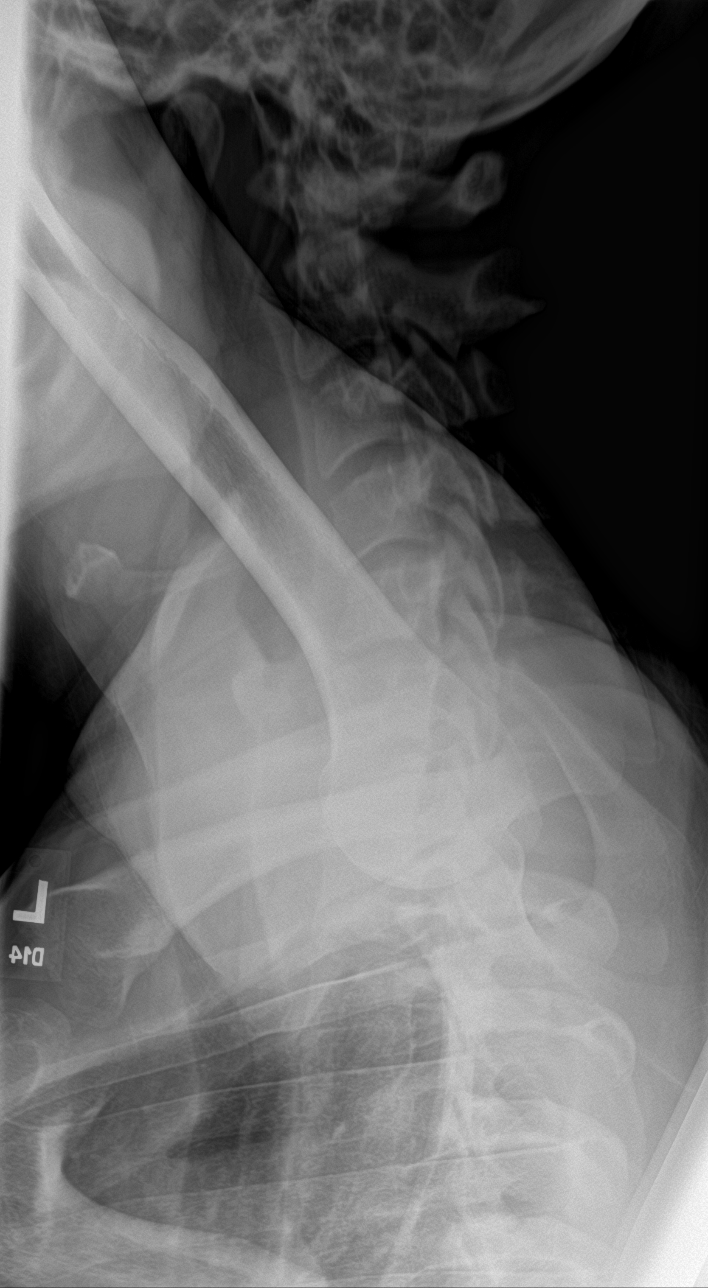

[4 of 4 positions shown; findings below may reference images not displayed]

FINDINGS: There is no evidence of cervical spine fracture or prevertebral soft
tissue swelling. Alignment is normal. No other significant bone
abnormalities are identified.
IMPRESSION: Negative cervical spine radiographs.

## 2021-08-19 IMAGING — MR MR HEAD W/O CM
11 series · 48 of 48 positions shown · non-contrast
Comparison: None.

CLINICAL DATA: Acute neuro deficit.

EXAM:
MRI HEAD WITHOUT CONTRAST
TECHNIQUE: Multiplanar, multiecho pulse sequences of the brain and surrounding
structures were obtained without intravenous contrast.

[Series 5: DWI · axial · 3.0mm · 1.36mm/px · z∈[-30,+111]mm · 7 of 96 slices shown (1 of 2)]
[im 1/96]
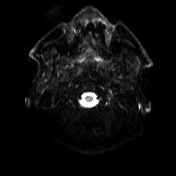
[im 16/96]
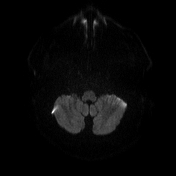
[im 32/96]
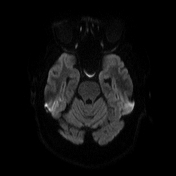
[im 48/96]
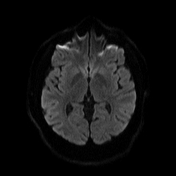
[im 64/96]
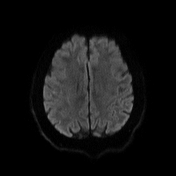
[im 80/96]
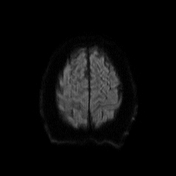
[im 96/96]
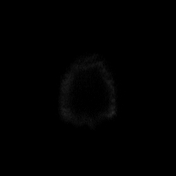

[Series 6: DWI · axial · 3.0mm · 1.36mm/px · z∈[-30,+111]mm · 4 of 48 slices shown (2 of 2)]
[im 1/48]
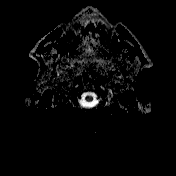
[im 16/48]
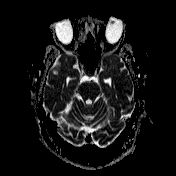
[im 32/48]
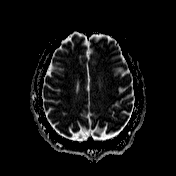
[im 48/48]
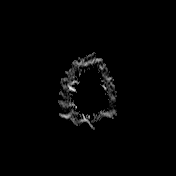

[Series 7: T1 · sagittal · 5.0mm · 0.75mm/px · 2 of 24 slices shown (1 of 2)]
[im 1/24]
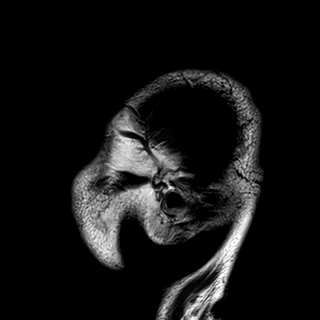
[im 24/24]
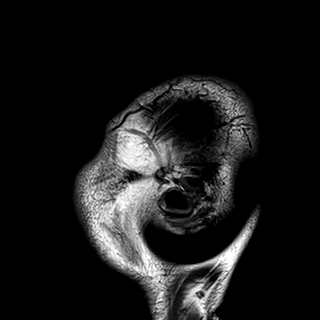

[Series 8: T2 · axial · 5.0mm · 0.62mm/px · z∈[-31,+119]mm · 2 of 24 slices shown (1 of 2)]
[im 1/24]
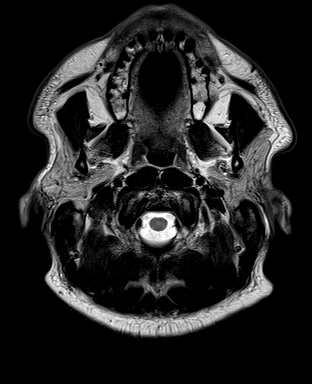
[im 24/24]
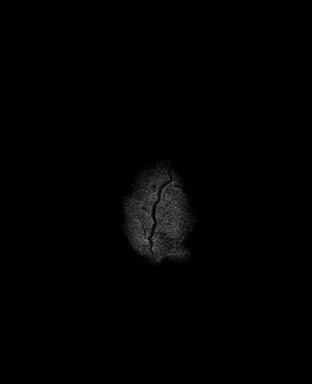

[Series 9: swi_images · axial · 3.0mm · 0.75mm/px · z∈[-32,+120]mm · 4 of 52 slices shown]
[im 1/52]
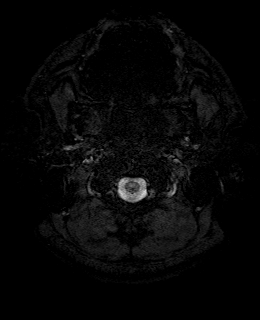
[im 18/52]
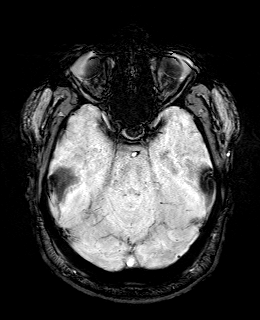
[im 35/52]
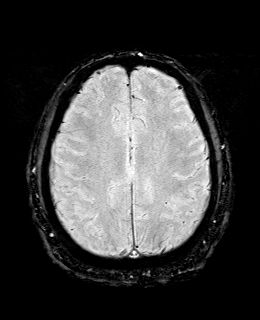
[im 52/52]
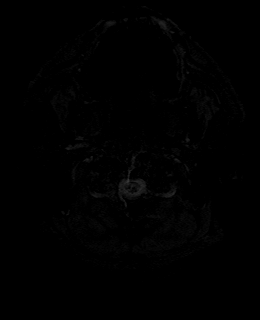

[Series 10: mip_images(sw) · axial · 24.0mm · 0.75mm/px · z∈[-22,+110]mm · 4 of 45 slices shown]
[im 1/45]
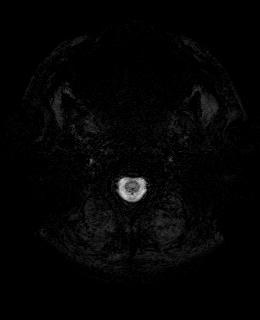
[im 15/45]
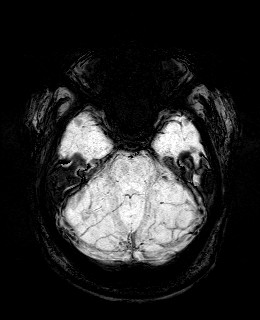
[im 30/45]
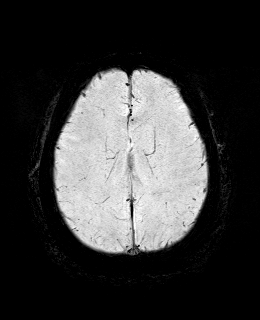
[im 45/45]
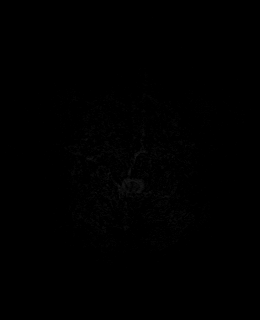

[Series 11: FLAIR · axial · 3.0mm · 0.75mm/px · z∈[-32,+120]mm · 4 of 52 slices shown]
[im 1/52]
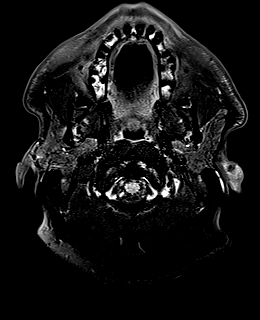
[im 18/52]
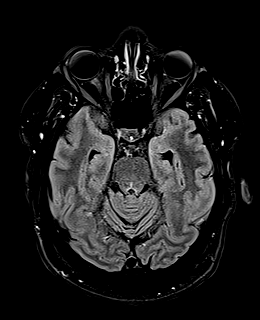
[im 35/52]
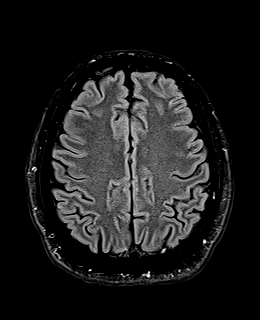
[im 52/52]
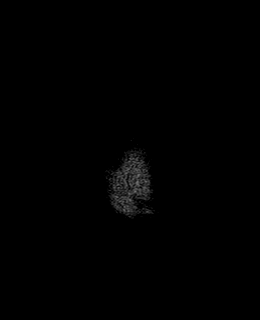

[Series 12: T1 · axial · 1.0mm · 0.94mm/px · z∈[-27,+116]mm · 12 of 144 slices shown (2 of 2)]
[im 1/144]
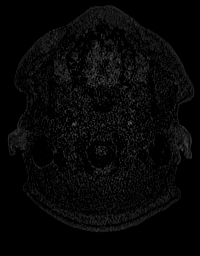
[im 14/144]
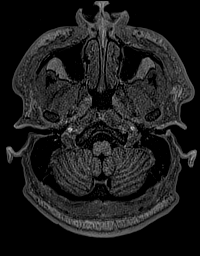
[im 27/144]
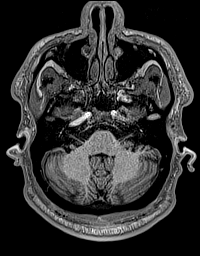
[im 40/144]
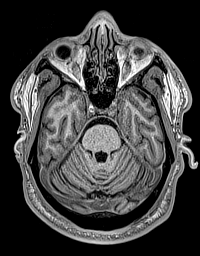
[im 53/144]
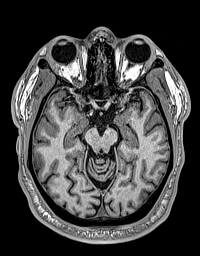
[im 66/144]
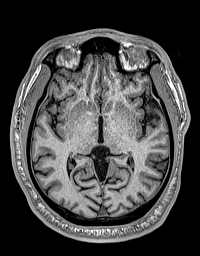
[im 79/144]
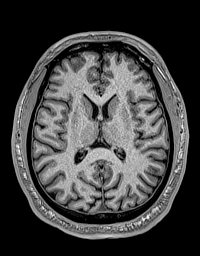
[im 92/144]
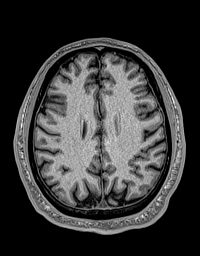
[im 105/144]
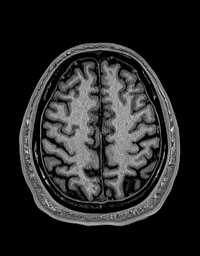
[im 118/144]
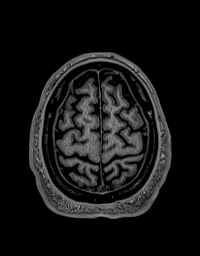
[im 131/144]
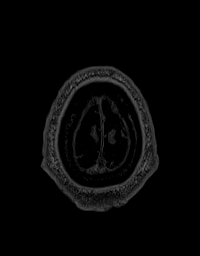
[im 144/144]
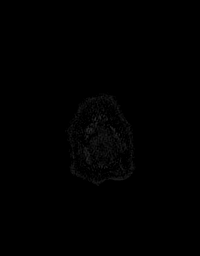

[Series 13: cor dwi_tracew · coronal · 5.0mm · 1.53mm/px · 4 of 48 slices shown]
[im 1/48]
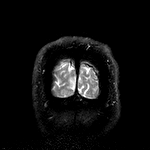
[im 16/48]
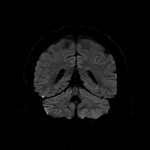
[im 32/48]
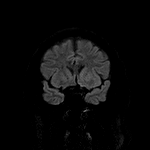
[im 48/48]
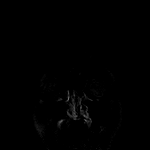

[Series 14: cor dwi_adc · coronal · 5.0mm · 1.53mm/px · 2 of 24 slices shown]
[im 1/24]
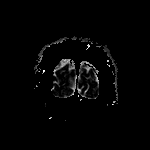
[im 24/24]
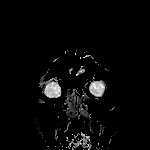

[Series 15: T2 · coronal · 5.0mm · 0.69mm/px · 3 of 32 slices shown (2 of 2)]
[im 1/32]
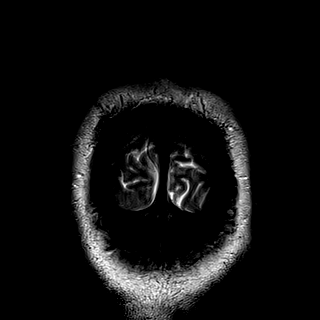
[im 16/32]
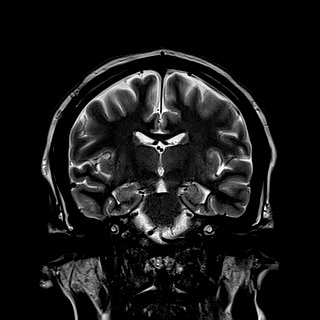
[im 32/32]
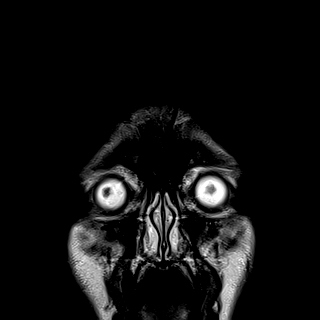

[48 of 48 positions shown; findings below may reference images not displayed]

FINDINGS: Brain: No acute infarction, hemorrhage, hydrocephalus, extra-axial
collection or mass lesion.

Vascular: Negative for hyperdense vessel

Skull and upper cervical spine: No focal lesion.

Sinuses/Orbits: Mild mucosal edema paranasal sinuses. Negative orbit

Other: None
IMPRESSION: Normal MRI brain.

## 2021-08-19 MED ORDER — MORPHINE SULFATE (PF) 2 MG/ML IV SOLN
2.0000 mg | Freq: Once | INTRAVENOUS | Status: AC | PRN
  Administered 2021-08-19: 2 mg via INTRAVENOUS
  Filled 2021-08-19: qty 1

## 2021-08-19 MED ORDER — LACTATED RINGERS IV SOLN: INTRAVENOUS | Status: DC

## 2021-08-19 MED ORDER — INSULIN GLARGINE-YFGN 100 UNIT/ML ~~LOC~~ SOLN
40.0000 [IU] | Freq: Every day | SUBCUTANEOUS | Status: DC
Start: 2021-08-19 — End: 2021-08-20
  Administered 2021-08-19: 40 [IU] via SUBCUTANEOUS
  Filled 2021-08-19: qty 0.4

## 2021-08-19 MED ORDER — MORPHINE SULFATE (PF) 2 MG/ML IV SOLN: 1.0000 mg | Freq: Once | INTRAVENOUS | Status: DC | PRN

## 2021-08-19 NOTE — Progress Notes (Signed)
Occupational Therapy Treatment ?Patient Details ?Name: Victor Matthews ?MRN: [id number] ?DOB: 01/17/1986 ?Today's Date: 08/19/2021 ? ? ?History of present illness 36 yo male admitted with weakness, falls, hyperglycemia. Hx of  DM, chronic pain, neuropathy, anxiety, bipolar disorder with recent E Coli bacteremia in early February and COVID infection late February presenting with hyperglycemia, malaise, and ambulatory dysfunction. ?  ?OT comments ? Patient is very motivated to get better, wants to be able to take care of himself but currently cannot go home as he lives alone and has been falling frequently. Patient needing mod +2 and two attempts to power up to standing at edge of bed. Patient reports increased pain that radiates from his back to his feet that he describes as burning and throbbing. With mod +2 patient able to pivot to recliner chair with  rolling walker and needed assist with eccentric control. Recommend short term rehab at D/C to maximize patient safety and independence with self care in order to return home alone safely.   ? ?Recommendations for follow up therapy are one component of a multi-disciplinary discharge planning process, led by the attending physician.  Recommendations may be updated based on patient status, additional functional criteria and insurance authorization. ?   ?Follow Up Recommendations ? Skilled nursing-short term rehab (<3 hours/day) (per patient AIR denied)  ?  ?Assistance Recommended at Discharge Frequent or constant Supervision/Assistance  ?Patient can return home with the following ? A lot of help with bathing/dressing/bathroom;Two people to help with walking and/or transfers;Assistance with cooking/housework;Assist for transportation;Help with stairs or ramp for entrance ?  ?Equipment Recommendations ? Tub/shower bench;BSC/3in1  ?  ?   ?Precautions / Restrictions Precautions ?Precautions: Fall ?Precaution Comments: frequent falls, hx of neuropathy ?Restrictions ?Weight  Bearing Restrictions: No  ? ? ?  ? ?Mobility Bed Mobility ?Overal bed mobility: Needs Assistance ?Bed Mobility: Supine to Sit ?  ?  ?Supine to sit: Supervision ?  ?  ?General bed mobility comments: Increased time but no physical assistance ?  ? ? ?  ?Balance Overall balance assessment: Needs assistance, History of Falls ?Sitting-balance support: Feet supported ?Sitting balance-Leahy Scale: Good ?  ?  ?Standing balance support: Reliant on assistive device for balance ?Standing balance-Leahy Scale: Poor ?  ?  ?  ?  ?  ?  ?  ?  ?  ?  ?  ?  ?   ? ?ADL either performed or assessed with clinical judgement  ? ?ADL Overall ADL's : Needs assistance/impaired ?  ?  ?  ?  ?  ?  ?  ?  ?  ?  ?  ?  ?Toilet Transfer: Moderate assistance;+2 for physical assistance;+2 for safety/equipment;Rolling walker (2 wheels);Stand-pivot ?Toilet Transfer Details (indicate cue type and reason): To recliner chair. Patient needed two attempts to stand with increased pain in feet with weight bearing. On second attempt patient able to tolerate standing long enough to pivot to recliner chair with assist needed for eccentric lowering into chair. ?  ?  ?  ?  ?Functional mobility during ADLs: Moderate assistance;+2 for physical assistance;+2 for safety/equipment;Rolling walker (2 wheels);Cueing for sequencing;Cueing for safety ?  ?  ? ? ? ?Cognition Arousal/Alertness: Awake/alert ?Behavior During Therapy: [facility] for tasks assessed/performed ?Overall Cognitive Status: Within Functional Limits for tasks assessed ?  ?  ?  ?  ?  ?  ?  ?  ?  ?  ?  ?  ?  ?  ?  ?  ?  ?  ?  ?   ?   ?   ?   ? ? ?  Pertinent Vitals/ Pain       Pain Assessment ?Pain Assessment: Faces ?Faces Pain Scale: Hurts even more ?Pain Location: bilateral pain in feet, LEs, back ?Pain Descriptors / Indicators: Burning, Throbbing ?Pain Intervention(s): Monitored during session, Limited activity within patient's tolerance ? ?   ?   ? ?Frequency ? Min 2X/week  ? ? ? ? ?  ?Progress Toward Goals ? ?OT  Goals(current goals can now be found in the care plan section) ? Progress towards OT goals: Progressing toward goals ? ?Acute Rehab OT Goals ?Patient Stated Goal: Go to rehab ?OT Goal Formulation: With patient ?Time For Goal Achievement: 09/01/21 ?Potential to Achieve Goals: Good ?ADL Goals ?Pt Will Perform Lower Body Dressing: with modified independence ?Pt Will Transfer to Toilet: with modified independence;ambulating;regular height toilet ?Pt Will Perform Toileting - Clothing Manipulation and hygiene: with modified independence;sit to/from stand ?Additional ADL Goal #1: Patient will stand at sink to perform grooming task as evidence of improving activity tolerance ?Additional ADL Goal #2: Patient will perform 15 min functional activity or exercise activity as evidence of improving activity tolerance  ?Plan Discharge plan needs to be updated   ? ?   ?AM-PAC OT "6 Clicks" Daily Activity     ?Outcome Measure ? ? Help from another person eating meals?: None ?Help from another person taking care of personal grooming?: A Little ?Help from another person toileting, which includes using toliet, bedpan, or urinal?: A Lot ?Help from another person bathing (including washing, rinsing, drying)?: A Lot ?Help from another person to put on and taking off regular upper body clothing?: A Little ?Help from another person to put on and taking off regular lower body clothing?: A Lot ?6 Click Score: 16 ? ?  ?End of Session Equipment Utilized During Treatment: Rolling walker (2 wheels) ? ?OT Visit Diagnosis: Unsteadiness on feet (R26.81);Other abnormalities of gait and mobility (R26.89);Repeated falls (R29.6);Muscle weakness (generalized) (M62.81) ?  ?Activity Tolerance Patient tolerated treatment well ?  ?Patient Left in chair;with call bell/phone within reach ?  ?Nurse Communication Mobility status ?  ? ?   ? ?Time: [id number] ?OT Time Calculation (min): 13 min ? ?Charges: OT General Charges ?$OT Visit: 1 Visit ?OT Treatments ?$Self  Care/Home Management : 8-22 mins ? ?Delbert Phenix OT ?OT pager: [phone] ? ? ?Rosemary Holms ?08/19/2021, 1:44 PM ?

## 2021-08-19 NOTE — [place] FL2 (Signed)
?[place] MEDICAID FL2 LEVEL OF CARE SCREENING TOOL  ?  ? ?IDENTIFICATION  ?Patient Name: ?Victor Matthews Birthdate: April 20, 1986 Sex: male Admission Date (Current Location): ?08/17/2021  ?[place] Number: ? [place] ?  Facility and Address:  ?[facility],  [address], [place] [address] ?     Provider Number: ?[id number]  ?Attending Physician Name and Address:  ?Zigmund Daniel., * ? Relative Name and Phone Number:  ?Debbora Lacrosse Clearence Cheek)   [phone] ?   ?Current Level of Care: ?Hospital Recommended Level of Care: ?Skilled Nursing Facility Prior Approval Number: ?  ? ?Date Approved/Denied: ?  PASRR Number: ?  ? ?Discharge Plan: ?SNF ?  ? ?Current Diagnoses: ?Patient Active Problem List  ? Diagnosis Date Noted  ? Ambulatory dysfunction 08/18/2021  ? Neuropathy 08/18/2021  ? History of bacteremia 08/18/2021  ? History of COVID-19 08/18/2021  ? Unresponsive episode 08/18/2021  ? Hyperglycemia 08/17/2021  ? Chronic pain syndrome 08/17/2021  ? ? ?Orientation RESPIRATION BLADDER Height & Weight   ?  ?Self, Time, Situation, Place ? Normal Continent Weight: 98.4 kg ?Height:  6' (182.9 cm)  ?BEHAVIORAL SYMPTOMS/MOOD NEUROLOGICAL BOWEL NUTRITION STATUS  ?Other (Comment) (None)   Continent Diet (see d/c summary)  ?AMBULATORY STATUS COMMUNICATION OF NEEDS Skin   ?Extensive Assist Verbally Normal ?  ?  ?  ?    ?     ?     ? ? ?Personal Care Assistance Level of Assistance  ?Bathing, Feeding, Dressing Bathing Assistance: Maximum assistance ?Feeding assistance: Independent ?Dressing Assistance: Limited assistance ?   ? ?Functional Limitations Info  ?Sight, Hearing, Speech Sight Info: Adequate ?Hearing Info: Adequate ?Speech Info: Adequate  ? ? ?SPECIAL CARE FACTORS FREQUENCY  ?PT (By licensed PT), OT (By licensed OT)   ?  ?PT Frequency: 5X/W ?OT Frequency: 5X/W ?  ?  ?  ?   ? ? ?Contractures Contractures Info: Not present  ? ? ?Additional Factors Info  ?Code Status, Allergies Code Status Info:  full ?Allergies Info: NKA ?  ?  ?  ?   ? ?Current Medications (08/19/2021):  This is the current hospital active medication list ?Current Facility-Administered Medications  ?Medication Dose Route Frequency Provider Last Rate Last Admin  ? 0.9 %  sodium chloride infusion  250 mL Intravenous PRN Luiz Iron, NP      ? acetaminophen (TYLENOL) tablet 650 mg  650 mg Oral Q6H PRN Hillary Bow, DO   650 mg at 08/18/21 2376  ? Or  ? acetaminophen (TYLENOL) suppository 650 mg  650 mg Rectal Q6H PRN Hillary Bow, DO      ? dextrose 50 % solution 0-50 mL  0-50 mL Intravenous PRN Loeffler, Finis Bud, PA-C      ? enoxaparin (LOVENOX) injection 40 mg  40 mg Subcutaneous Q24H Lyda Perone M, DO   40 mg at 08/18/21 2831  ? insulin aspart (novoLOG) injection 0-20 Units  0-20 Units Subcutaneous TID WC Zigmund Daniel., MD   11 Units at 08/19/21 [phone]  ? insulin aspart (novoLOG) injection 0-5 Units  0-5 Units Subcutaneous QHS Zigmund Daniel., MD      ? insulin aspart (novoLOG) injection 15 Units  15 Units Subcutaneous TID WC Zigmund Daniel., MD   15 Units at 08/19/21 [phone]  ? insulin glargine-yfgn (SEMGLEE) injection 40 Units  40 Units Subcutaneous Daily Zigmund Daniel., MD      ? ketorolac (TORADOL) 15 MG/ML injection 15  mg  15 mg Intravenous Q6H Zigmund Daniel., MD   15 mg at 08/19/21 0516  ? lidocaine (LIDODERM) 5 % 1 patch  1 patch Transdermal Q24H Zigmund Daniel., MD      ? morphine (MS CONTIN) 12 hr tablet 15 mg  15 mg Oral Q12H Lyda Perone M, DO   15 mg at 08/18/21 2143  ? ondansetron (ZOFRAN) tablet 4 mg  4 mg Oral Q6H PRN Hillary Bow, DO      ? Or  ? ondansetron (ZOFRAN) injection 4 mg  4 mg Intravenous Q6H PRN Hillary Bow, DO      ? oxyCODONE-acetaminophen (PERCOCET) 7.5-325 MG per tablet 1 tablet  1 tablet Oral Q8H PRN Hillary Bow, DO   1 tablet at 08/18/21 1629  ? pregabalin (LYRICA) capsule 300 mg  300 mg Oral TID Hillary Bow, DO   300 mg at 08/18/21  2143  ? QUEtiapine (SEROQUEL) tablet 100 mg  100 mg Oral QHS PRN Zigmund Daniel., MD   100 mg at 08/18/21 2153  ? sodium chloride flush (NS) 0.9 % injection 3 mL  3 mL Intravenous Q12H Luiz Iron, NP   3 mL at 08/18/21 2144  ? sodium chloride flush (NS) 0.9 % injection 3 mL  3 mL Intravenous PRN Luiz Iron, NP      ? traZODone (DESYREL) tablet 150 mg  150 mg Oral QHS Zigmund Daniel., MD   150 mg at 08/18/21 2143  ? ? ? ?Discharge Medications: ?Please see discharge summary for a list of discharge medications. ? ?Relevant Imaging Results: ? ?Relevant Lab Results: ? ? ?Additional Information ?[phone] ? ?Ida Rogue, LCSW ? ? ? ? ?

## 2021-08-19 NOTE — Progress Notes (Addendum)
Inpatient Diabetes Program Recommendations ? ?AACE/ADA: New Consensus Statement on Inpatient Glycemic Control (2015) ? ?Target Ranges:  Prepandial:   less than 140 mg/dL ?     Peak postprandial:   less than 180 mg/dL (1-2 hours) ?     Critically ill patients:  140 - 180 mg/dL  ? ?Lab Results  ?Component Value Date  ? GLUCAP 271 (H) 08/19/2021  ? HGBA1C 14.6 (H) 08/18/2021  ? ? ?Review of Glycemic Control ? ?Diabetes history: DM1 ?Outpatient Diabetes medications: Levemir 40 QHS, Humalog 35 TID ?Current orders for Inpatient glycemic control: Semglee 40 QD, Novolog 0-20 TID with meals and 0-5 HS + 15 units TID ? ?HgbA1C - 14.6% ? ?Inpatient Diabetes Program Recommendations:   ? ?Agree with titration of Semglee and Novolog. ?May need more meal coverage if post-prandials > 180 mg/dL. ? ?Spoke with pt at bedside regarding his diabetes control. Pt states no issues with obtaining his meds. Will need PCP for diabetes management. Having weakness and malaise, states hasn't felt like himself since Covid 19+ in Feb 2023. Tries to eat good, but has periods of eating poorly. States he takes his insulin as prescribed and monitors his blood sugars several times/day. HgbA1C of 14.6% indicates average blood sugar of close to 400 mg/dL each day. Needs Endo and OP Diabetes Education. Discussed importance of getting diabetes in control to prevent long and short-term complications.  ? ?Will follow. ? ?Thank you. ?Lorenda Peck, RD, LDN, CDE ?Inpatient Diabetes Coordinator ?[phone]  ? ?Addendum: Pt states he is "starving" and would like larger portions. Said he would not order fast food while inpatient if he could get a little more food. Discussed healthy snacks like low-fat yogurt, cheese and crackers or peanut butter and crackers. Told pt that protein (low-fat) will help him feel full longer.Discussed unsweetened beverages with sugar substitute is much better choice than regular sodas. Pt appreciative of visit. Discussed with  charge nurse. ?RV ? ? ? ? ? ? ? ? ? ? ?

## 2021-08-19 NOTE — Progress Notes (Signed)
Chaplain engaged in an initial visit with Victor Matthews who shared about his healthcare journey and where he is spiritually.  Chaplain offered reflective listening and supportive presence during this time.  Chaplain affirmed his battle in his health and the ways that has impacted him emotionally and spiritually, as well as within his community.  Chaplain let him know about the Spiritual Care team presence to be there for him as needed. ? ?Victor Matthews also had questions around going to rehab.  Chaplain let him know the process of speaking with the Education officer, museum and finding places that will accept him based on insurance and availability.  Chaplain let nurse know to consult Social Work on [employer].  ? ? Victor Matthews voiced that he is used to pouring out so much to others and doing things on his own.  Chaplain affirmed his need to rest and let others pour into him.  ? ? ? 08/19/21 1500  ?Clinical Encounter Type  ?Visited With Patient  ?Visit Type Initial;Spiritual support  ? ? ?

## 2021-08-19 NOTE — Progress Notes (Addendum)
?PROGRESS NOTE ? ? ? ?Ethanjames Fontenot  YBO:[id number] DOB: August 08, 1985 DOA: 08/17/2021 ?PCP: Provider, Generic External Data  ?Chief Complaint  ?Patient presents with  ? Hyperglycemia  ? ? ?Brief Narrative:  ?36 yo with hx T1DY, chronic pain, neuropathy, anxiety, bipolar disorder with recent E Coli bacteremia in early February and COVID infection late February presenting with hyperglycemia, malaise, and ambulatory dysfunction.   ? ? ?Assessment & Plan: ?  ?Principal Problem: ?  Hyperglycemia ?Active Problems: ?  Ambulatory dysfunction ?  Right facial numbness ?  Chronic pain syndrome ?  Neuropathy ?  Unresponsive episode ?  History of bacteremia ?  History of COVID-19 ? ? ?Assessment and Plan: ?* Hyperglycemia ?Transitioned to subcutaneous insulin today ?He's on 40 units levemir at home and 30 units humalog TID ac (it says 70/30 in his med rec, which doesn't seem right) ?Adjust basal/bolus prn ?He was eating sweet tea, [employer] earlier today -> education ? ?Right facial numbness ?To light touch, MRI brain with ambulatory dysfunction -> negative ? ?Ambulatory dysfunction ?Notes progressively worsening ambulatory dysfunction since January ?Worse in the past month or so, says he can't walk to bathroom/care for himself ?Hx "bulging disc" on R.  He notes saddle anesthesia for 2 months and urinary incontinence maybe 6 months.  ?4/5 strength to LE's bilaterally L>R ?Plain films with mild anterior wedging of T12, likely developmental ?Lumbar MRI with small L subarticular disc protrusion at L5-S1 ?MRI T and L spine pending  ?Neuropathy probably significant contributor as well.  May need to consult neurology if unrevealing. ? ?Chronic pain syndrome ?Pt seeing pain management for chronic BLE pain. ?Did verify with PMP aware that pt does take: ?Percocet 7.5 mg TID PRN ?MS Contin 15 mg BID scheduled ?Lyrica 300 mg PO TID ?He understands need to get refills for chronic pain meds from primary prescriber ? ?Unresponsive episode ?Notes  they had issues waking him up on day of admission, unclear, I don't see documentation regarding this -> ? Related to pain meds/AKI.  Will monitor for now. ? ?Neuropathy ?Peripheral neuropathy from DM, likely contributing to ambulatory dysfunction ? ?History of bacteremia ?E coli in 2/10 blood cx ?Treated and blood cx since negative ? ?History of COVID-19 ?2/27, continued ageusia  ?Feels like he's declined since COVID infection ? ?Addendum -> BPA for suicide screen noted, patient denies SI ? ?DVT prophylaxis: lovenox ?Code Status: full ?Family Communication: none  ?Disposition:  ? ?Status is: inpatient ?The patient will require care spanning > 2 midnights and should be moved to inpatient because: continued need for workup of ambulatory dysfunction ?  ?Consultants:  ?none ? ?Procedures:  ?none ? ?Antimicrobials:  ?Anti-infectives (From admission, onward)  ? ? None  ? ?  ? ? ?Subjective: ?Continued pain ?Groin numbness present x2 months, incontinence present for 6 he estimates ?He's needed assistance to walk, on and off for last year -> worse with more falls over last 2 months ? ?Objective: ?Vitals:  ? 08/18/21 2028 08/19/21 0047 08/19/21 0504 08/19/21 1401  ?BP: 122/85 (!) 144/98 (!) 132/92 121/80  ?Pulse: 89 77 66 90  ?Resp: 18 16 18 16   ?Temp: 97.7 ?F (36.5 ?C) 98.7 ?F (37.1 ?C) 98.4 ?F (36.9 ?C) 98.7 ?F (37.1 ?C)  ?TempSrc: Oral Oral Oral Oral  ?SpO2: 100% 99% 97% 100%  ?Weight:      ?Height:      ? ? ?Intake/Output Summary (Last 24 hours) at 08/19/2021 1618 ?Last data filed at 08/19/2021 1300 ?Gross per 24 hour  ?  Intake 240 ml  ?Output 900 ml  ?Net -660 ml  ? ?Filed Weights  ? 08/17/21 1355 08/17/21 1825  ?Weight: 99.3 kg 98.4 kg  ? ? ?Examination: ? ?General: No acute distress. ?Cardiovascular: RRR ?Lungs: unlabored ?Abdomen: Soft, nontender, nondistended  ?Neurological: TTP to R paraspinal region in lower back (lumbar region), mild to thoracic - Decreased sensation to R face, 5/5 strength to upper extremities.   Normal FNF bilaterally.  4/5 strength to lower extremities. ?Extremities: No clubbing or cyanosis. No edema. ? ?Data Reviewed: I have personally reviewed following labs and imaging studies ? ?CBC: ?Recent Labs  ?Lab 08/17/21 ?1411 08/17/21 ?1452 08/18/21 ?0243  ?WBC 5.2  --  5.1  ?NEUTROABS 3.7  --   --   ?HGB 11.4* 13.6 10.9*  ?HCT 33.8* 40.0 31.7*  ?MCV 80.3  --  80.1  ?PLT 377  --  363  ? ? ?Basic Metabolic Panel: ?Recent Labs  ?Lab 08/17/21 ?1411 08/17/21 ?1452 08/17/21 ?1846 08/18/21 ?0243 08/18/21 ?1646 08/19/21 ?[id number]  ?NA 125* 125* 130* 136  --  135  ?K 4.6 5.6* 4.1 3.3*  --  4.6  ?CL 86*  --  95* 100  --  100  ?CO2 27  --  24 27  --  27  ?GLUCOSE 886*  --  399* 187* 412* 278*  ?BUN 11  --  11 10  --  14  ?CREATININE 1.34*  --  1.27* 0.94  --  1.26*  ?CALCIUM 9.3  --  9.6 9.1  --  8.8*  ?MG 2.2  --   --   --   --   --   ?PHOS 4.0  --   --   --   --   --   ? ? ?GFR: ?Estimated Creatinine Clearance: 100.4 mL/min (Candy Leverett) (by C-G formula based on SCr of 1.26 mg/dL (H)). ? ?Liver Function Tests: ?Recent Labs  ?Lab 08/17/21 ?1411  ?AST 19  ?ALT 10  ?ALKPHOS 147*  ?BILITOT 0.5  ?PROT 8.7*  ?ALBUMIN 3.1*  ? ? ?CBG: ?Recent Labs  ?Lab 08/18/21 ?0700 08/18/21 ?1230 08/18/21 ?1633 08/18/21 ?2128 08/19/21 ?0816  ?GLUCAP 144* 400* 404* 143* 271*  ? ? ? ?Recent Results (from the past 240 hour(s))  ?MRSA Next Gen by PCR, Nasal     Status: None  ? Collection Time: 08/17/21  6:18 PM  ? Specimen: Nasal Mucosa; Nasal Swab  ?Result Value Ref Range Status  ? MRSA by PCR Next Gen NOT DETECTED NOT DETECTED Final  ?  Comment: (NOTE) ?The GeneXpert MRSA Assay (FDA approved for NASAL specimens only), ?is one component of [facility] comprehensive MRSA colonization surveillance ?program. It is not intended to diagnose MRSA infection nor to guide ?or monitor treatment for MRSA infections. ?Test performance is not FDA approved in patients less than 2 years ?old. ?Performed at [facility], [address] ?[place], [place]  [id number] ?  ?  ? ? ? ? ? ?Radiology Studies: ?DG Lumbar Spine 2-3 Views ? ?Result Date: 08/18/2021 ?CLINICAL DATA:  Fall last week. EXAM: LUMBAR SPINE - 2-3 VIEW COMPARISON:  None. FINDINGS: Normal alignment. Mild anterior wedging at T12 which is likely developmental. No acute lumbar fracture. Normal disc spaces. Bowel staples in the region of the lower rectum. IMPRESSION: Mild anterior wedging of T12 which is likely developmental. No acute lumbar fracture. Electronically Signed   By: Marlan Palauharles  Clark M.D.   On: 08/18/2021 10:32  ? ?MR BRAIN WO CONTRAST ? ?Result Date: 08/19/2021 ?  CLINICAL DATA:  Acute neuro deficit. EXAM: MRI HEAD WITHOUT CONTRAST TECHNIQUE: Multiplanar, multiecho pulse sequences of the brain and surrounding structures were obtained without intravenous contrast. COMPARISON:  None. FINDINGS: Brain: No acute infarction, hemorrhage, hydrocephalus, extra-axial collection or mass lesion. Vascular: Negative for hyperdense vessel Skull and upper cervical spine: No focal lesion. Sinuses/Orbits: Mild mucosal edema paranasal sinuses. Negative orbit Other: None IMPRESSION: Normal MRI brain. Electronically Signed   By: Marlan Palau M.D.   On: 08/19/2021 11:37  ? ?MR Lumbar Spine W Wo Contrast ? ?Result Date: 08/19/2021 ?CLINICAL DATA:  Low back pain EXAM: MRI LUMBAR SPINE WITHOUT AND WITH CONTRAST TECHNIQUE: Multiplanar and multiecho pulse sequences of the lumbar spine were obtained without and with intravenous contrast. CONTRAST:  23mL GADAVIST GADOBUTROL 1 MMOL/ML IV SOLN COMPARISON:  None. FINDINGS: Segmentation:  Standard. Alignment:  Physiologic. Vertebrae:  No fracture, evidence of discitis, or bone lesion. Conus medullaris and cauda equina: Conus extends to the L1 level. Conus and cauda equina appear normal. Paraspinal and other soft tissues: Heterogeneous, partially cystic lesion of the right kidney. Renal ultrasound recommended for further characterization. Disc levels: L1-L2: Normal disc space and facet  joints. No spinal canal stenosis. No neural foraminal stenosis. L2-L3: Normal disc space and facet joints. No spinal canal stenosis. No neural foraminal stenosis. L3-L4: Disc desiccation with minimal bulge. No spin

## 2021-08-19 NOTE — Assessment & Plan Note (Signed)
Partially cystic lesion of R kidney ?

## 2021-08-19 NOTE — Progress Notes (Signed)
Chaplain attempted to make initial contact but Kal had been taken for an MRI.  Chaplain will follow-up and provide spiritual care support later today.  ? ? ? 08/19/21 1100  ?Clinical Encounter Type  ?Visited With Patient not available  ?Visit Type Initial;Spiritual support  ? ? ?

## 2021-08-19 NOTE — Assessment & Plan Note (Signed)
To light touch, MRI brain with ambulatory dysfunction -> negative ?

## 2021-08-20 ENCOUNTER — Encounter (HOSPITAL_COMMUNITY): Payer: Self-pay | Admitting: Emergency Medicine

## 2021-08-20 ENCOUNTER — Emergency Department (HOSPITAL_COMMUNITY)
Admission: EM | Admit: 2021-08-20 | Discharge: 2021-08-20 | Disposition: A | Discharge status: Home / Self Care | Payer: Medicare Other | Attending: Emergency Medicine | Admitting: Emergency Medicine

## 2021-08-20 ENCOUNTER — Emergency Department (HOSPITAL_COMMUNITY): Payer: Medicare Other

## 2021-08-20 DIAGNOSIS — D72829 Elevated white blood cell count, unspecified: Secondary | ICD-10-CM | POA: Insufficient documentation

## 2021-08-20 DIAGNOSIS — W182XXA Fall in (into) shower or empty bathtub, initial encounter: Secondary | ICD-10-CM | POA: Insufficient documentation

## 2021-08-20 DIAGNOSIS — E1165 Type 2 diabetes mellitus with hyperglycemia: Secondary | ICD-10-CM | POA: Insufficient documentation

## 2021-08-20 DIAGNOSIS — Z794 Long term (current) use of insulin: Secondary | ICD-10-CM | POA: Insufficient documentation

## 2021-08-20 DIAGNOSIS — R000 Tachycardia, unspecified: Secondary | ICD-10-CM | POA: Insufficient documentation

## 2021-08-20 DIAGNOSIS — S0990XA Unspecified injury of head, initial encounter: Secondary | ICD-10-CM

## 2021-08-20 DIAGNOSIS — E1140 Type 2 diabetes mellitus with diabetic neuropathy, unspecified: Secondary | ICD-10-CM | POA: Insufficient documentation

## 2021-08-20 DIAGNOSIS — W19XXXA Unspecified fall, initial encounter: Secondary | ICD-10-CM | POA: Insufficient documentation

## 2021-08-20 LAB — COMPREHENSIVE METABOLIC PANEL
ALT: 8 U/L (ref 0–44)
AST: 16 U/L (ref 15–41)
Albumin: 2.9 g/dL — ABNORMAL LOW (ref 3.5–5.0)
Alkaline Phosphatase: 113 U/L (ref 38–126)
Anion gap: 7 (ref 5–15)
BUN: 16 mg/dL (ref 6–20)
CO2: 27 mmol/L (ref 22–32)
Calcium: 9 mg/dL (ref 8.9–10.3)
Chloride: 103 mmol/L (ref 98–111)
Creatinine, Ser: 1.53 mg/dL — ABNORMAL HIGH (ref 0.61–1.24)
GFR, Estimated: >60 mL/min (ref >60)
Glucose, Bld: 332 mg/dL — ABNORMAL HIGH (ref 70–99)
Potassium: 4.4 mmol/L (ref 3.5–5.1)
Sodium: 137 mmol/L (ref 135–145)
Total Bilirubin: 0.1 mg/dL — ABNORMAL LOW (ref 0.3–1.2)
Total Protein: 8 g/dL (ref 6.5–8.1)

## 2021-08-20 LAB — URINALYSIS, ROUTINE W REFLEX MICROSCOPIC
Bacteria, UA: NONE SEEN
Bilirubin Urine: NEGATIVE
Glucose, UA: 150 mg/dL — AB
Hgb urine dipstick: NEGATIVE
Ketones, ur: NEGATIVE mg/dL
Nitrite: NEGATIVE
Protein, ur: NEGATIVE mg/dL
Specific Gravity, Urine: 1.014 (ref 1.005–1.030)
pH: 5 (ref 5.0–8.0)

## 2021-08-20 LAB — CBC WITH DIFFERENTIAL/PLATELET
Abs Immature Granulocytes: 0.02 10*3/uL (ref 0.00–0.07)
Basophils Absolute: 0 10*3/uL (ref 0.0–0.1)
Basophils Relative: 0 %
Eosinophils Absolute: 0.1 10*3/uL (ref 0.0–0.5)
Eosinophils Relative: 1 %
HCT: 32.1 % — ABNORMAL LOW (ref 39.0–52.0)
Hemoglobin: 10.8 g/dL — ABNORMAL LOW (ref 13.0–17.0)
Immature Granulocytes: 0 %
Lymphocytes Relative: 22 %
Lymphs Abs: 1.3 10*3/uL (ref 0.7–4.0)
MCH: 27.8 pg (ref 26.0–34.0)
MCHC: 33.6 g/dL (ref 30.0–36.0)
MCV: 82.5 fL (ref 80.0–100.0)
Monocytes Absolute: 0.4 10*3/uL (ref 0.1–1.0)
Monocytes Relative: 7 %
Neutro Abs: 4 10*3/uL (ref 1.7–7.7)
Neutrophils Relative %: 70 %
Platelets: 325 10*3/uL (ref 150–400)
RBC: 3.89 MIL/uL — ABNORMAL LOW (ref 4.22–5.81)
RDW: 14 % (ref 11.5–15.5)
WBC: 5.8 10*3/uL (ref 4.0–10.5)
nRBC: 0 % (ref 0.0–0.2)

## 2021-08-20 LAB — TROPONIN I (HIGH SENSITIVITY)
Troponin I (High Sensitivity): 4 ng/L (ref <18)
Troponin I (High Sensitivity): 4 ng/L (ref <18)

## 2021-08-20 LAB — CBG MONITORING, ED: Glucose-Capillary: 295 mg/dL — ABNORMAL HIGH (ref 70–99)

## 2021-08-20 LAB — GLUCOSE, CAPILLARY
Glucose-Capillary: 99 mg/dL (ref 70–99)
Glucose-Capillary: 150 mg/dL — ABNORMAL HIGH (ref 70–99)

## 2021-08-20 LAB — LIPASE, BLOOD: Lipase: 28 U/L (ref 11–51)

## 2021-08-20 IMAGING — CT CT CERVICAL SPINE W/O CM
3 of 4 series · 13 of 33 positions shown, 15 images · non-contrast
Comparison: Cervical spine MRI [DATE].  Brain MRI [DATE].

CLINICAL DATA: Head trauma, minor, normal mental status. Neck
trauma, uncomplicated. Additional history provided: Fall with head
injury in shower today (with loss of consciousness).



[Series 4: c spine soft · axial · 0.24mm/px · z∈[-252,-150]mm · 4 of 87 slices shown]
[im 18/87  soft-tissue]
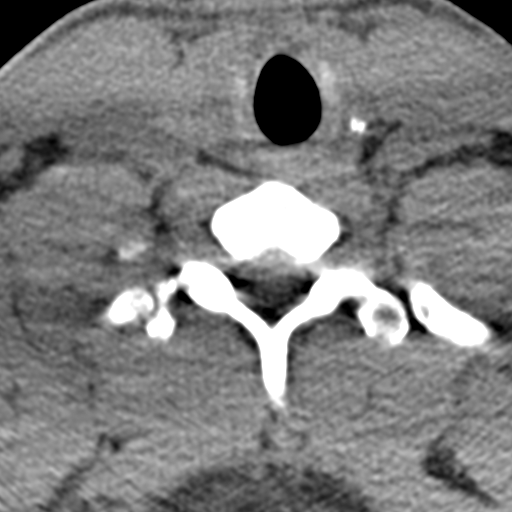
[im 35/87  soft-tissue]
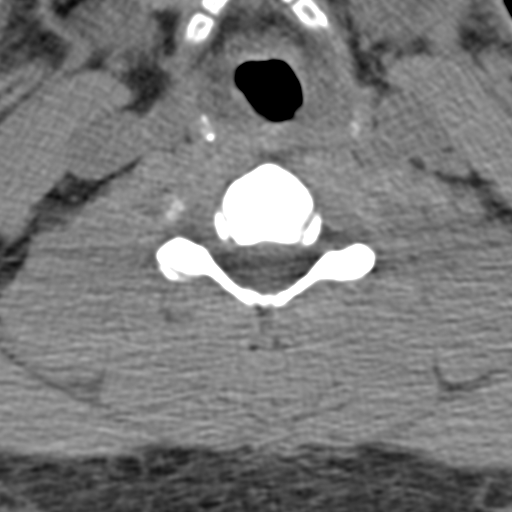
[im 52/87  soft-tissue]
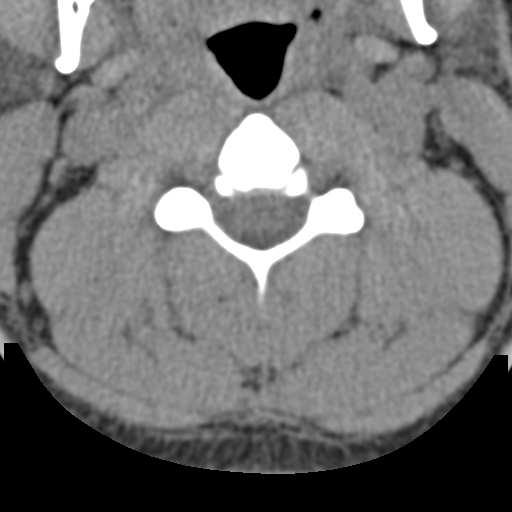
[im 69/87  soft-tissue]
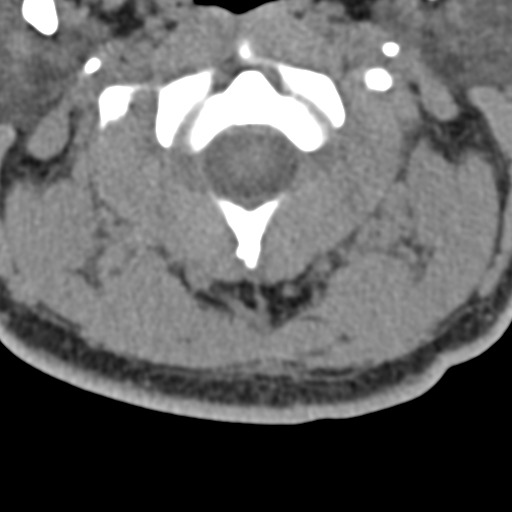

[Series 6: orthogonal bone · axial · 0.18mm/px · z∈[-286,-142]mm · 6 of 105 slices shown, 8 images]
[im 15/105  soft-tissue]
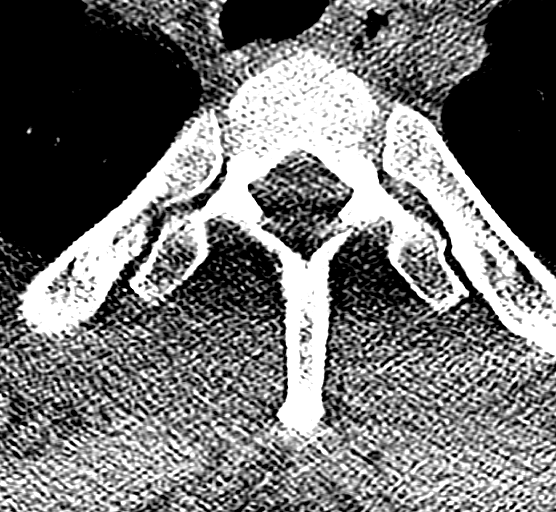
[im 15/105  bone]
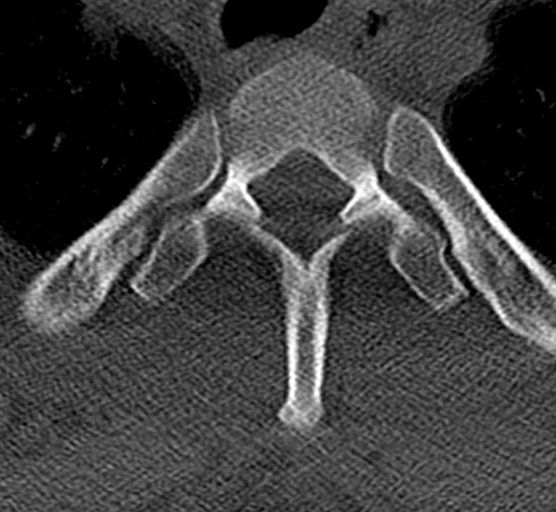
[im 30/105  bone]
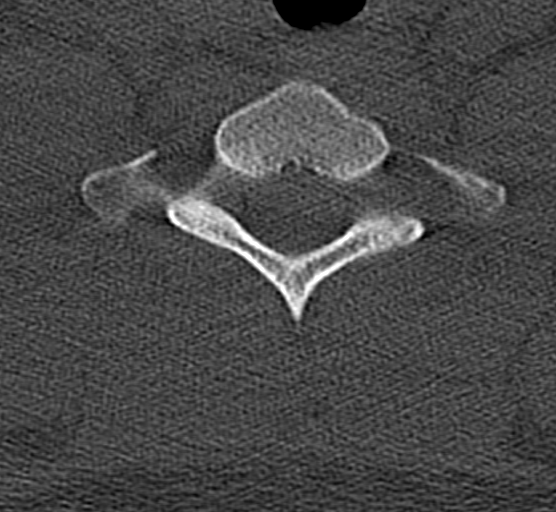
[im 45/105  bone]
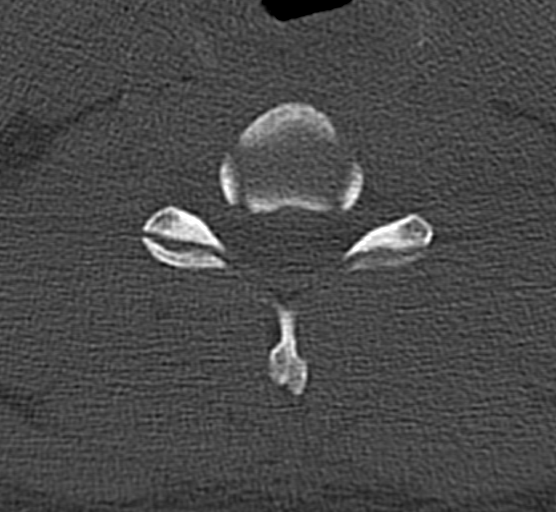
[im 60/105  bone]
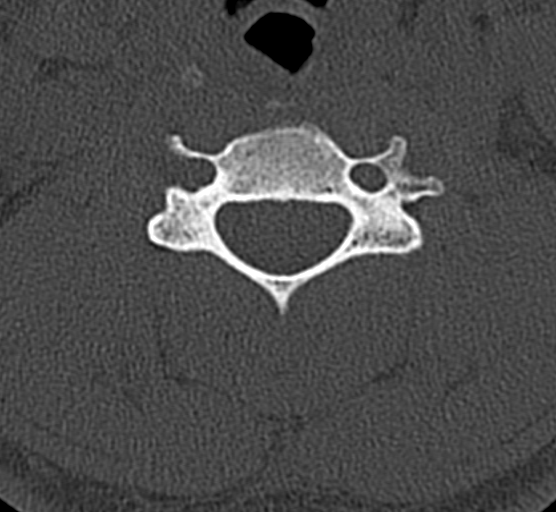
[im 75/105  soft-tissue]
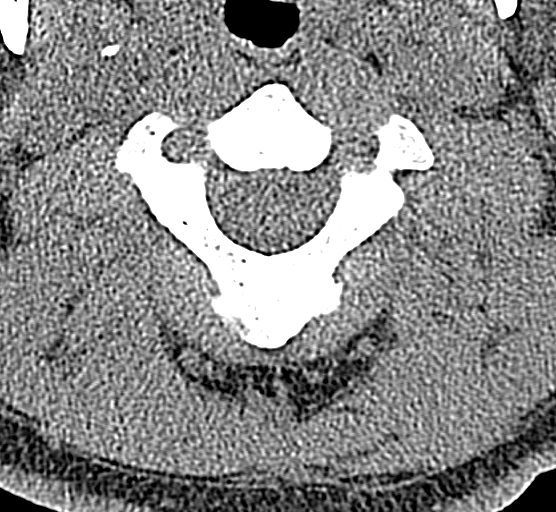
[im 75/105  bone]
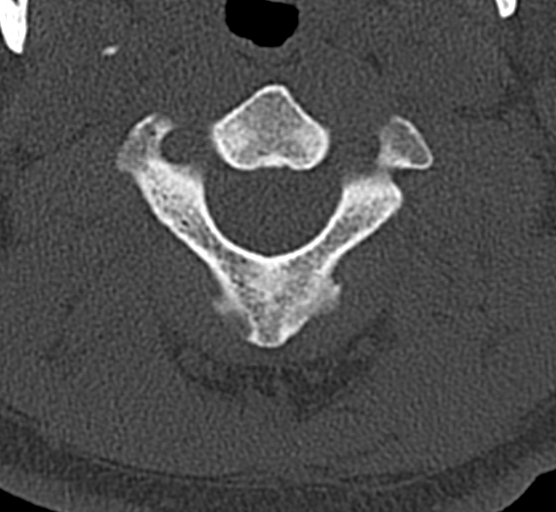
[im 90/105  bone]
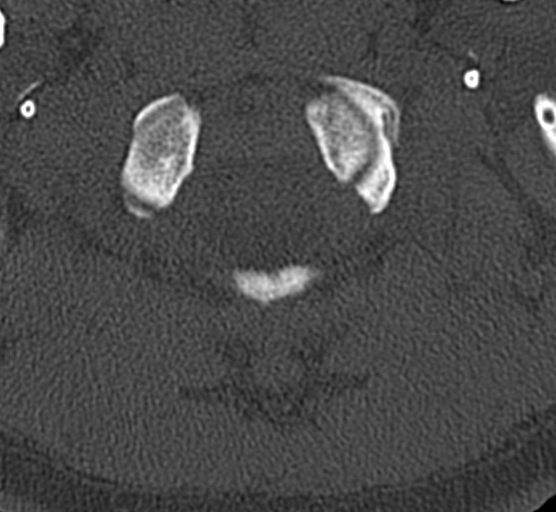

[Series 7: coronal bone · coronal · 0.20mm/px · 3 of 47 slices shown]
[im 10/47  bone]
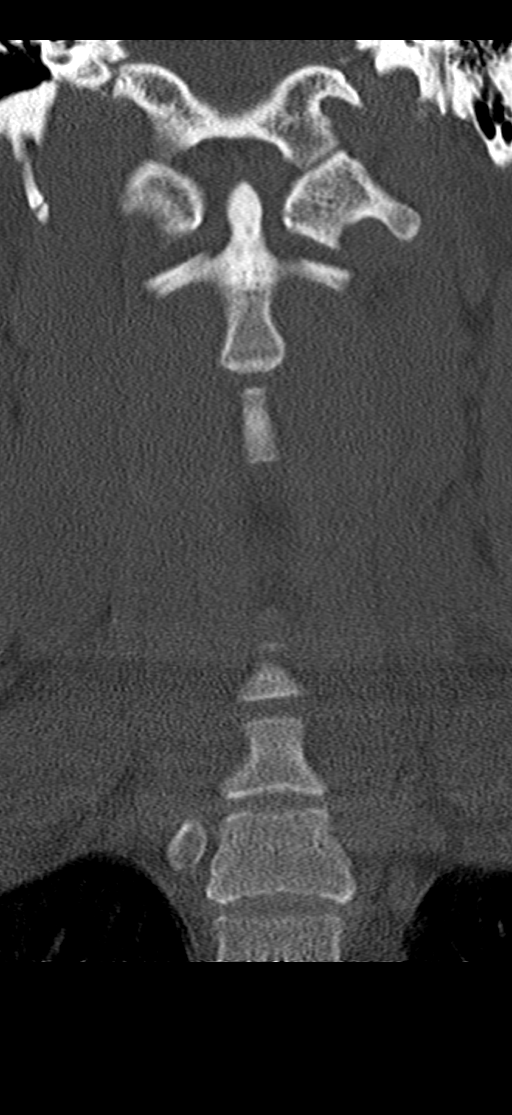
[im 19/47  bone]
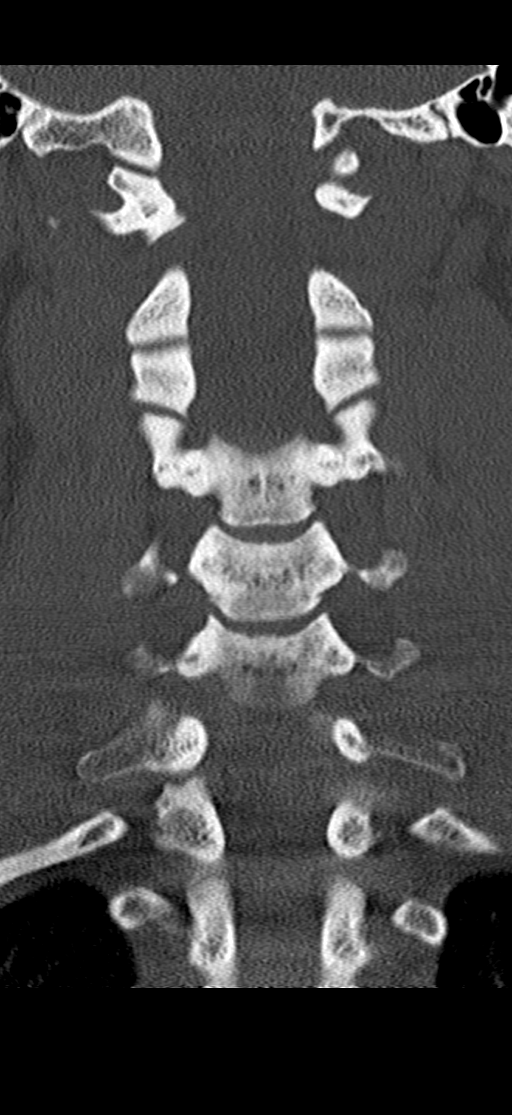
[im 28/47  bone]
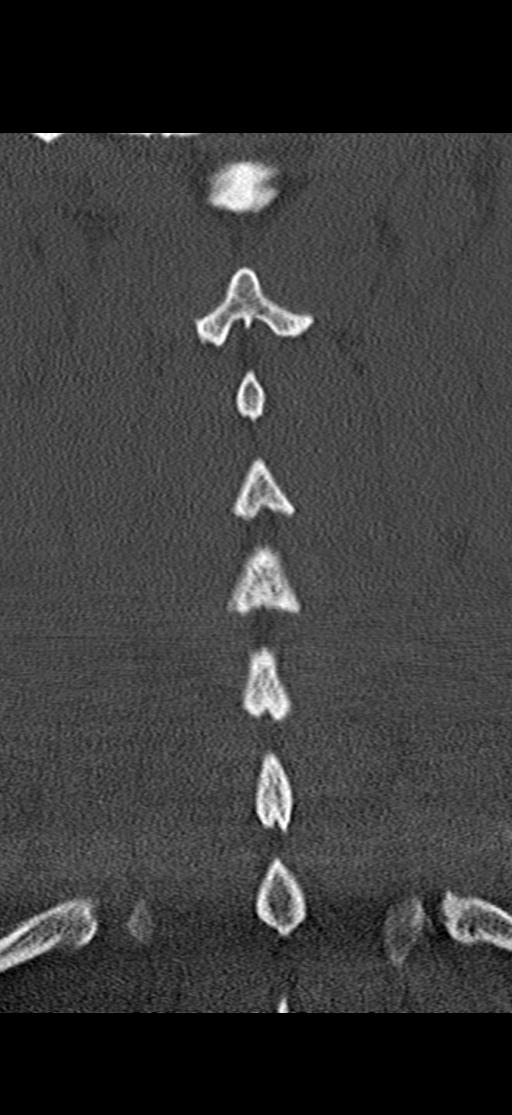

[13 of 33 positions shown; findings below may reference images not displayed]

FINDINGS: CT HEAD FINDINGS

Brain:

No age-advanced or lobar predominant parenchymal atrophy.

There is no acute intracranial hemorrhage.

No demarcated cortical infarct.

No extra-axial fluid collection.

No evidence of an intracranial mass.

No midline shift.

Vascular: No hyperdense vessel.

Skull: Normal. Negative for fracture or focal lesion.

Sinuses/Orbits: Visualized orbits show no acute finding. No
significant paranasal sinus disease at the imaged levels.

Other: A small right parietal scalp hematoma is questioned (for
instance as seen on series 3, image 27).

CT CERVICAL SPINE FINDINGS

Alignment: Reversal of the expected cervical lordosis. No
significant spondylolisthesis.

Skull base and vertebrae: The basion-dental and atlanto-dental
intervals are maintained.No evidence of acute fracture to the
cervical spine.

Soft tissues and spinal canal: No prevertebral fluid or swelling. No
visible canal hematoma.

Disc levels: No significant bony spinal canal or neural foraminal
narrowing.

Upper chest: No consolidation within the imaged lung apices. No
visible pneumothorax.
IMPRESSION: CT head:

1. No evidence of acute intracranial abnormality.
2. A small right parietal scalp hematoma is questioned.

CT cervical spine:

1. No evidence of acute fracture to the cervical spine.
2. Nonspecific reversal of the expected cervical lordosis.

## 2021-08-20 IMAGING — CT CT HEAD W/O CM
3 series · 14 of 47 positions shown, 16 images · non-contrast
Comparison: Cervical spine MRI [DATE].  Brain MRI [DATE].

CLINICAL DATA: Head trauma, minor, normal mental status. Neck
trauma, uncomplicated. Additional history provided: Fall with head
injury in shower today (with loss of consciousness).



[Series 3: head wo · axial · 0.39mm/px · z∈[-109,+16]mm · 8 of 31 slices shown, 10 images]
[im 3/31  brain]
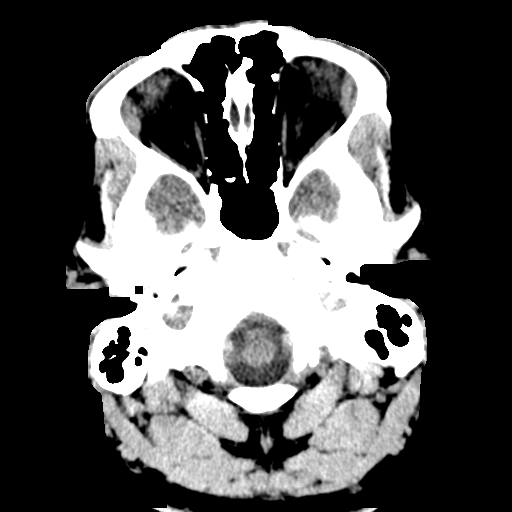
[im 3/31  bone]
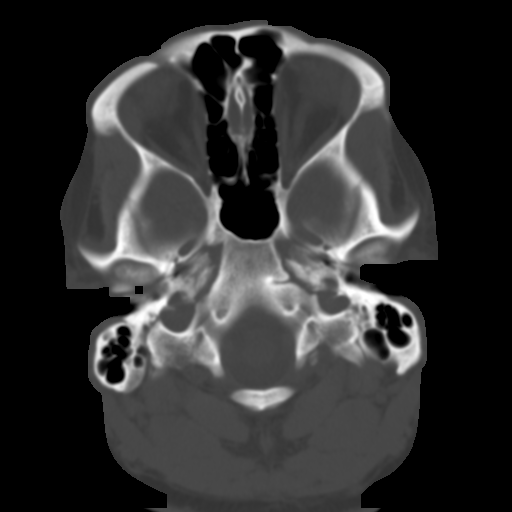
[im 7/31  brain]
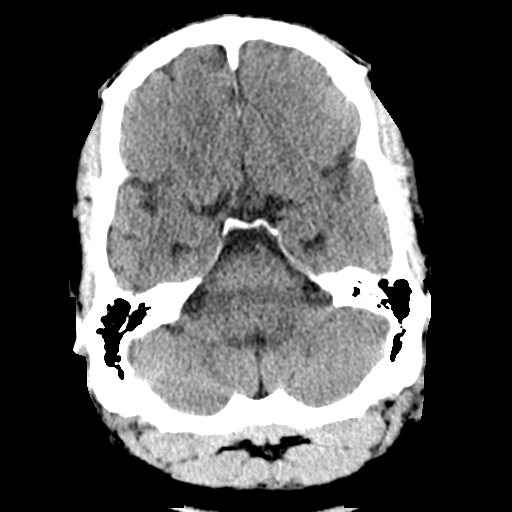
[im 10/31  brain]
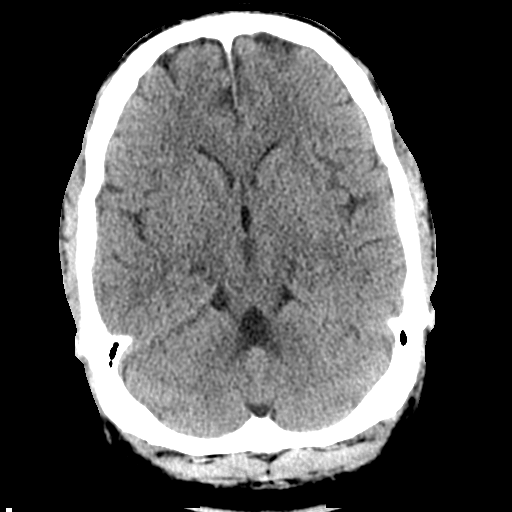
[im 14/31  brain]
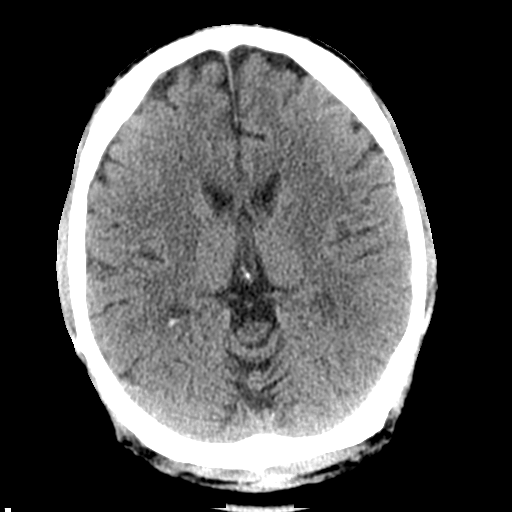
[im 17/31  brain]
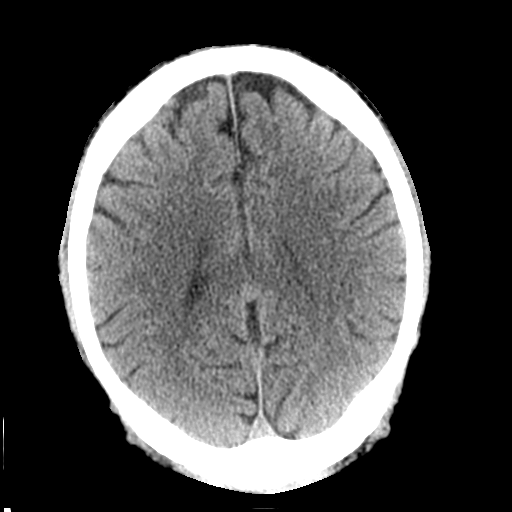
[im 17/31  bone]
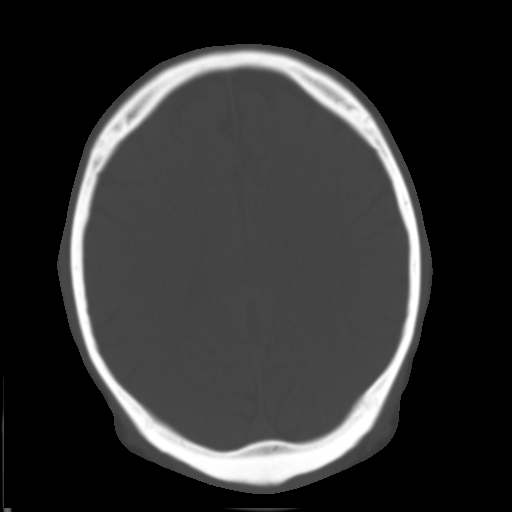
[im 21/31  brain]
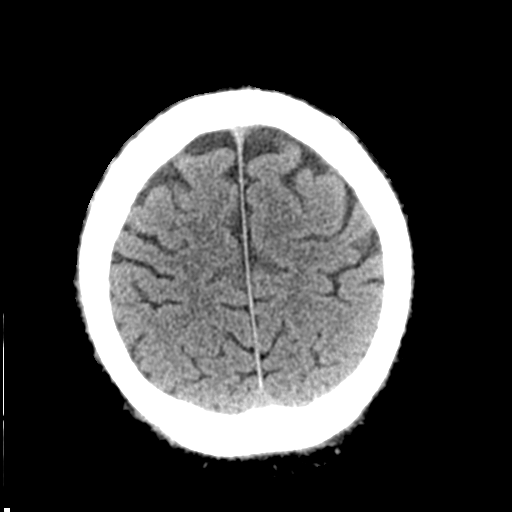
[im 24/31  brain]
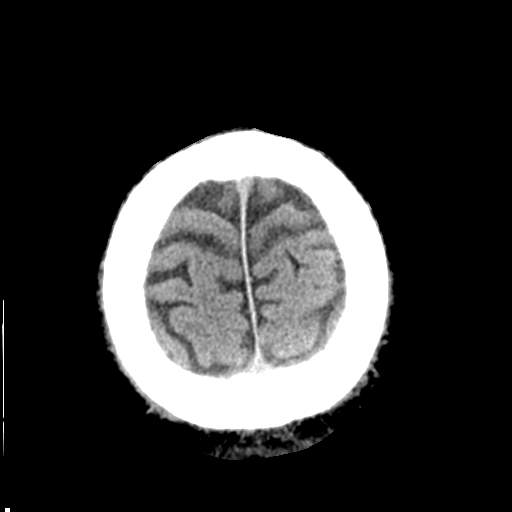
[im 28/31  brain]
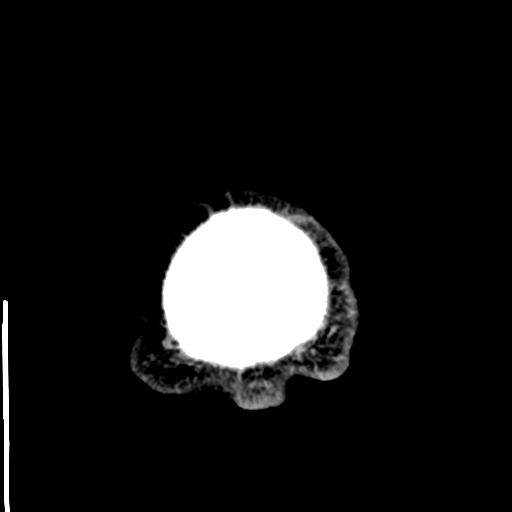

[Series 6: coronal soft tissue · coronal · 0.30mm/px · 3 of 68 slices shown]
[im 23/68  brain]
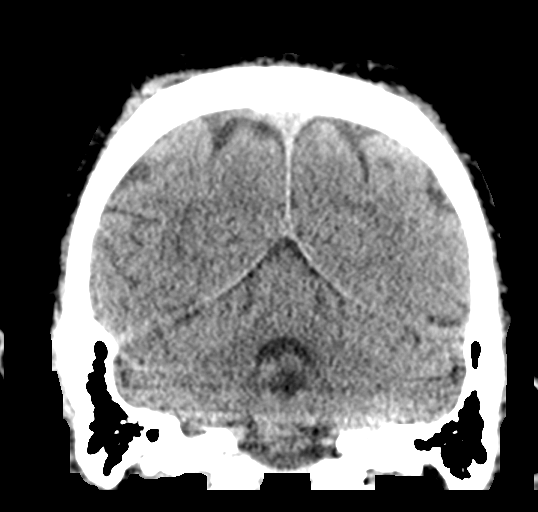
[im 30/68  brain]
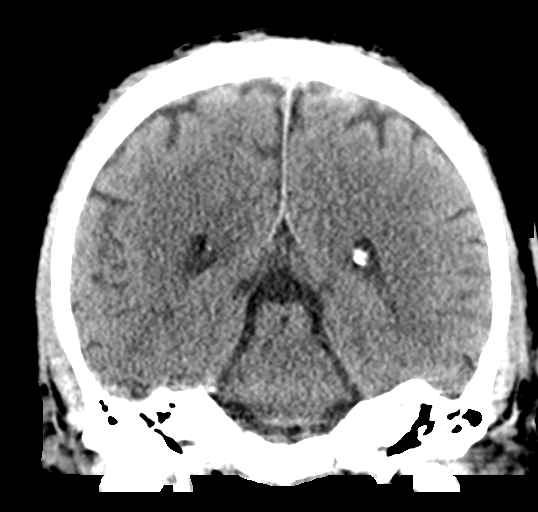
[im 38/68  brain]
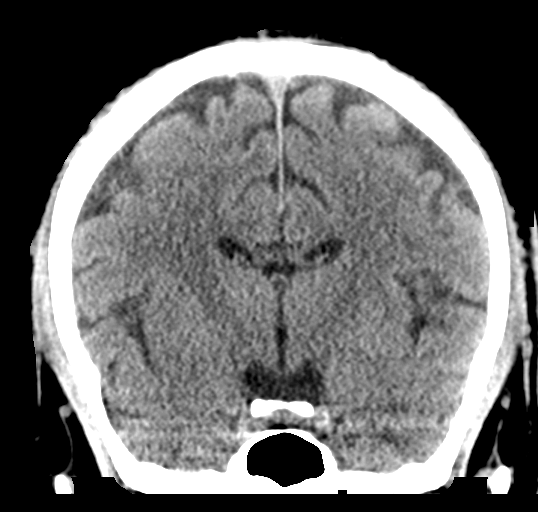

[Series 7: sagittal soft tissue · sagittal · 0.30mm/px · 3 of 54 slices shown]
[im 18/54  brain]
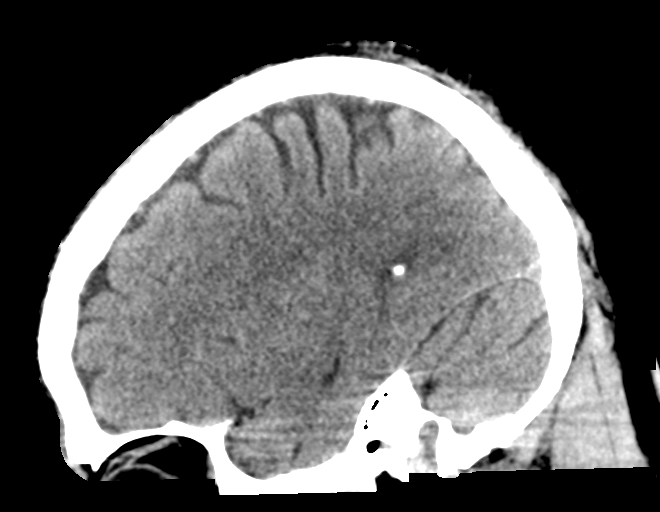
[im 27/54  brain]
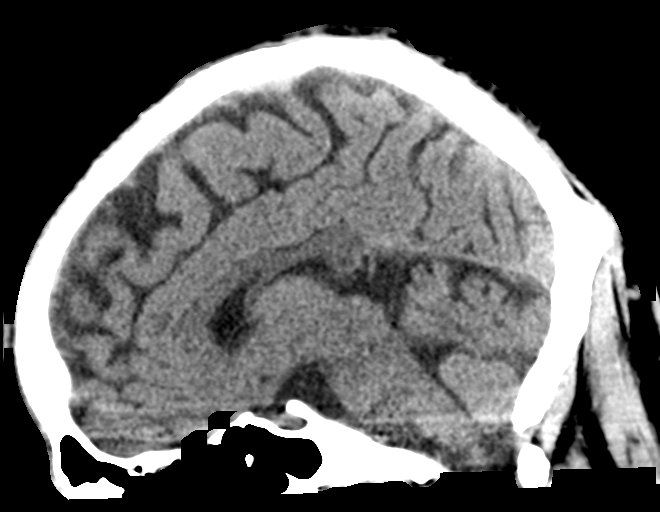
[im 36/54  brain]
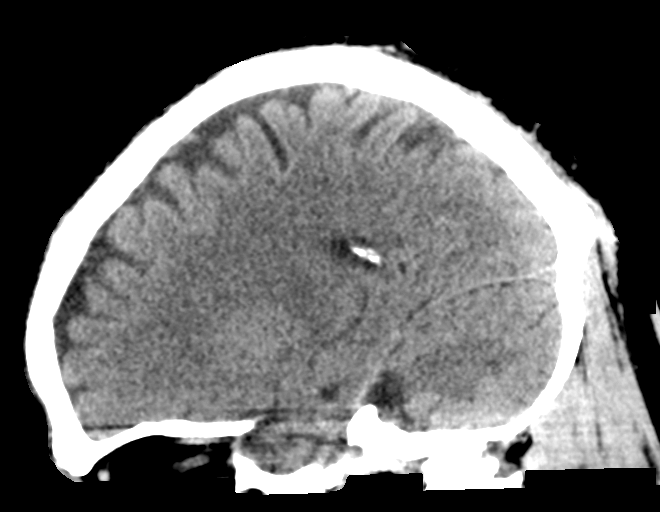

[14 of 47 positions shown; findings below may reference images not displayed]

FINDINGS: CT HEAD FINDINGS

Brain:

No age-advanced or lobar predominant parenchymal atrophy.

There is no acute intracranial hemorrhage.

No demarcated cortical infarct.

No extra-axial fluid collection.

No evidence of an intracranial mass.

No midline shift.

Vascular: No hyperdense vessel.

Skull: Normal. Negative for fracture or focal lesion.

Sinuses/Orbits: Visualized orbits show no acute finding. No
significant paranasal sinus disease at the imaged levels.

Other: A small right parietal scalp hematoma is questioned (for
instance as seen on series 3, image 27).

CT CERVICAL SPINE FINDINGS

Alignment: Reversal of the expected cervical lordosis. No
significant spondylolisthesis.

Skull base and vertebrae: The basion-dental and atlanto-dental
intervals are maintained.No evidence of acute fracture to the
cervical spine.

Soft tissues and spinal canal: No prevertebral fluid or swelling. No
visible canal hematoma.

Disc levels: No significant bony spinal canal or neural foraminal
narrowing.

Upper chest: No consolidation within the imaged lung apices. No
visible pneumothorax.
IMPRESSION: CT head:

1. No evidence of acute intracranial abnormality.
2. A small right parietal scalp hematoma is questioned.

CT cervical spine:

1. No evidence of acute fracture to the cervical spine.
2. Nonspecific reversal of the expected cervical lordosis.

## 2021-08-20 MED ORDER — PREGABALIN 50 MG PO CAPS
300.0000 mg | ORAL_CAPSULE | Freq: Once | ORAL | Status: AC
  Administered 2021-08-20: 300 mg via ORAL
  Filled 2021-08-20: qty 6

## 2021-08-20 NOTE — Plan of Care (Signed)

## 2021-08-20 NOTE — Discharge Instructions (Addendum)
Your laboratory results on today's visit were within normal limits aside from some slight elevation in your creatinine.  You may continue hydrating with plenty of water at home. ? ?You will need to follow-up with outpatient social work, I have placed a consult and they should be reaching out back out to you in the next few days or upcoming week. ? ?If you experience any worsening symptoms, chest pain, shortness of breath you will need to return to the emergency department. ?

## 2021-08-20 NOTE — ED Triage Notes (Signed)
Patient c/o fall with head injury in shower today. + LOC.  ?

## 2021-08-20 NOTE — ED Provider Notes (Signed)
I provided a substantive portion of the care of this patient.  I personally performed the entirety of the medical decision making for this encounter. ? ?EKG Interpretation ? ?Date/Time:  Friday August 20 2021 14:29:16 EDT ?Ventricular Rate:  104 ?PR Interval:  171 ?QRS Duration: 89 ?QT Interval:  335 ?QTC Calculation: 441 ?R Axis:   -2 ?Text Interpretation: Sinus tachycardia Probable left atrial enlargement Consider anterior infarct Minimal ST elevation, inferior leads Confirmed by Victor Matthews ([id number]) on 08/20/2021 8:11:92 PM  ? ?36 year old male who presents with increased lower extremity pain.  He is in pain management right now due to neuropathy as well as chronic lumbar disc disease.  Patient had recent admission with extensive work-up including multiple MRIs.  Patient's work-up here is also reassuring.  Plan will be to send consult to PT OT at home and recommend follow-up with Dr. ?  ?Victor Nick, MD ?08/20/21 2033 ? ?

## 2021-08-20 NOTE — ED Provider Notes (Signed)
?[facility]-EMERGENCY DEPT ?Provider Note ? ? ?CSN: [id number] ?Arrival date & time: 08/20/21  1406 ? ?  ? ?History ? ?Chief Complaint  ?Patient presents with  ? Fall  ? ? ?Victor Matthews is a 36 y.o. male. ? ?36 year old male with a past medical history of DM, neuropathy presents to the ED with a chief complaint of fall.  Patient was recently hospitalized 2 days ago due to hypeeglycemia, eloped from the hospital last night.  States he arrived at home, had a third fall this morning, according to significant other at the bedside, patient was down for approximately 1-1/2 hours after this fall and he could not wake him up.  Similar episode occurred while in the hospital.  Patient does report a loss of consciousness.  He is endorsing pain all over, feels like he is unable to be woken up by anyone.  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  All symptoms began after he had COVID approximately 3 weeks ago.  No prior history of seizures, currently on no blood thinners, evaluated by physical therapy while in the hospital and was a candidate for rehabilitation.  ? ? ? ?The history is provided by the patient and medical records.  ?Fall ?This is a recurrent problem. The current episode started 1 to 2 hours ago. The problem occurs constantly. The problem has not changed since onset.Pertinent negatives include no chest pain, no abdominal pain, no headaches and no shortness of breath.  ? ?  ? ?Home Medications ?Prior to Admission medications   ?Medication Sig Start Date End Date Taking? Authorizing Provider  ?FLUoxetine (PROZAC) 20 MG capsule Take 20 mg by mouth daily as needed (anxiety attacks).    [provider]  ?insulin aspart protamine- aspart (NOVOLOG MIX 70/30) (70-30) 100 UNIT/ML injection Inject 0.4 mLs (40 Units total) into the skin with breakfast, with lunch, and with evening meal. ?Patient not taking:  Reported on 08/18/2021 08/09/21   Mesner, Barbara Cower, MD  ?insulin detemir (LEVEMIR) 100 UNIT/ML injection Inject 0.3 mLs (30 Units total) into the skin daily. ?Patient taking differently: Inject 40 Units into the skin at bedtime. 08/09/21   Mesner, Barbara Cower, MD  ?insulin lispro protamine-lispro (HUMALOG 75/25 MIX) (75-25) 100 UNIT/ML SUSP injection Inject 35 Units into the skin 3 (three) times daily with meals.    [provider]  ?pregabalin (LYRICA) 300 MG capsule Take 1 capsule (300 mg total) by mouth in the morning, at noon, and at bedtime. 08/09/21 09/08/21  Mesner, Barbara Cower, MD  ?QUEtiapine (SEROQUEL) 100 MG tablet Take 100 mg by mouth at bedtime as needed (anxiety).    [provider]  ?traZODone (DESYREL) 150 MG tablet Take 1 tablet (150 mg total) by mouth at bedtime. ?Patient taking differently: Take 300 mg by mouth at bedtime. 08/09/21   Mesner, Barbara Cower, MD  ?   ? ?Allergies    ?Patient has no known allergies.   ? ?Review of Systems   ?Review of Systems  ?Constitutional:  Negative for chills and fever.  ?HENT:  Negative for sore throat.   ?Respiratory:  Negative for shortness of breath.   ?Cardiovascular:  Negative for chest pain.  ?Gastrointestinal:  Negative for abdominal pain, diarrhea and vomiting.  ?Genitourinary:  Negative for flank pain.  ?Musculoskeletal:  Positive for myalgias.  ?Neurological:  Negative for light-headedness and headaches.  ?All other systems reviewed and are negative. ? ?Physical Exam ?Updated Vital Signs ?BP Marland Kitchen)  158/99   Pulse (!) 102   Temp 98.2 ?F (36.8 ?C) (Oral)   Resp 15   SpO2 100%  ?Physical Exam ?Vitals and nursing note reviewed.  ?Constitutional:   ?   Appearance: Normal appearance.  ?HENT:  ?   Head: Normocephalic and atraumatic.  ?   Mouth/Throat:  ?   Mouth: Mucous membranes are moist.  ?Cardiovascular:  ?   Rate and Rhythm: Normal rate.  ?Pulmonary:  ?   Effort: Pulmonary effort is normal.  ?   Breath sounds: No wheezing.  ?Abdominal:  ?   General: Abdomen is  flat.  ?   Tenderness: There is no abdominal tenderness. There is no right CVA tenderness or left CVA tenderness.  ?Musculoskeletal:  ?   Cervical back: Normal range of motion and neck supple.  ?Skin: ?   General: Skin is warm and dry.  ?Neurological:  ?   Mental Status: He is alert and oriented to person, place, and time.  ? ? ?ED Results / Procedures / Treatments   ?Labs ?(all labs ordered are listed, but only abnormal results are displayed) ?Labs Reviewed  ?CBC WITH DIFFERENTIAL/PLATELET - Abnormal; Notable for the following components:  ?    Result Value  ? RBC 3.89 (*)   ? Hemoglobin 10.8 (*)   ? HCT 32.1 (*)   ? All other components within normal limits  ?COMPREHENSIVE METABOLIC PANEL - Abnormal; Notable for the following components:  ? Glucose, Bld 332 (*)   ? Creatinine, Ser 1.53 (*)   ? Albumin 2.9 (*)   ? Total Bilirubin 0.1 (*)   ? All other components within normal limits  ?URINALYSIS, ROUTINE W REFLEX MICROSCOPIC - Abnormal; Notable for the following components:  ? Glucose, UA 150 (*)   ? Leukocytes,Ua TRACE (*)   ? All other components within normal limits  ?CBG MONITORING, ED - Abnormal; Notable for the following components:  ? Glucose-Capillary 295 (*)   ? All other components within normal limits  ?LIPASE, BLOOD  ?TROPONIN I (HIGH SENSITIVITY)  ?TROPONIN I (HIGH SENSITIVITY)  ? ? ?EKG ?EKG Interpretation ? ?Date/Time:  Friday August 20 2021 14:29:16 EDT ?Ventricular Rate:  104 ?PR Interval:  171 ?QRS Duration: 89 ?QT Interval:  335 ?QTC Calculation: 441 ?R Axis:   -2 ?Text Interpretation: Sinus tachycardia Probable left atrial enlargement Consider anterior infarct Minimal ST elevation, inferior leads Confirmed by Lorre NickAllen, Anthony ([id number]) on 08/20/2021 8:11:52 PM ? ?Radiology ?DG Cervical Spine 2 or 3 views ? ?Result Date: 08/19/2021 ?CLINICAL DATA:  Neck pain EXAM: CERVICAL SPINE - 2-3 VIEW COMPARISON:  None. FINDINGS: There is no evidence of cervical spine fracture or prevertebral soft tissue swelling.  Alignment is normal. No other significant bone abnormalities are identified. IMPRESSION: Negative cervical spine radiographs. Electronically Signed   By: Marlan Palauharles  Clark M.D.   On: 08/19/2021 17:42  ? ?DG Thoracic Spine 2 View ? ?Result Date: 08/19/2021 ?CLINICAL DATA:  Pain in cervical and thoracic spine EXAM: THORACIC SPINE 2 VIEWS COMPARISON:  Lumbar MRI in lumbar spine radiograph 08/18/2021 FINDINGS: Very mild decreased height of the T12 vertebral body is likely developmental. No evidence of fracture or edema on MRI. Remaining vertebral bodies in the thoracic spine are normal. Normal alignment. Disc spaces intact. No significant degenerative change. IMPRESSION: Negative Electronically Signed   By: Marlan Palauharles  Clark M.D.   On: 08/19/2021 17:38  ? ?CT HEAD WO CONTRAST (5MM) ? ?Result Date: 08/20/2021 ?CLINICAL DATA:  Head trauma, minor, normal mental status. Neck  trauma, uncomplicated. Additional history provided: Fall with head injury in shower today (with loss of consciousness). EXAM: CT HEAD WITHOUT CONTRAST CT CERVICAL SPINE WITHOUT CONTRAST TECHNIQUE: Multidetector CT imaging of the head and cervical spine was performed following the standard protocol without intravenous contrast. Multiplanar CT image reconstructions of the cervical spine were also generated. RADIATION DOSE REDUCTION: This exam was performed according to the departmental dose-optimization program which includes automated exposure control, adjustment of the mA and/or kV according to patient size and/or use of iterative reconstruction technique. COMPARISON:  Cervical spine MRI 08/19/2021.  Brain MRI 08/19/2021. FINDINGS: CT HEAD FINDINGS Brain: No age-advanced or lobar predominant parenchymal atrophy. There is no acute intracranial hemorrhage. No demarcated cortical infarct. No extra-axial fluid collection. No evidence of an intracranial mass. No midline shift. Vascular: No hyperdense vessel. Skull: Normal. Negative for fracture or focal lesion.  Sinuses/Orbits: Visualized orbits show no acute finding. No significant paranasal sinus disease at the imaged levels. Other: A small right parietal scalp hematoma is questioned (for instance as seen on series 3, image 27).

## 2021-08-20 NOTE — Discharge Summary (Signed)
Patient left AMA last night.  Unclear exact reasons, but based on my discussion with nursing this AM there were issues with compliance with diabetic diet/bringing in outside food and he left as Victor Matthews result of issues with this.  Not clear if he left with help/assistance given ambulatory dysfunction we were working up, sounds like per discussion with RN he walked off floor.  See previous notes for additional details.     ?

## 2021-08-20 NOTE — ED Notes (Addendum)
Walker provided for patient and educated on how to ambulate with walker. Return demonstration carried out. Patient able to use walker with strong steady gait to ambulate from the wheelchair to the bathroom in the waiting room safely.  ?

## 2021-08-20 NOTE — Progress Notes (Signed)
Called by PA [facility] regarding Victor Matthews, 36 yo with diabetes c/b peripheral neuropathy, chronic pain, anxiety, bipolar disorder, with recent E. Coli bacteremia in early February and COVID infection in late February who left [facility] 4/7 AM after being treated for hyperglycemia and undergoing an evaluation for progressive ambulatory dysfunction.  He left AMA this morning, sounds like issues with diet/compliance and he left as Victor Matthews result of being upset with this.  Of note, we were evaluating ambulatory dysfunction (MRI brain/C/T/L spine notable only for small L subarticular disc protrusion at L5-S1, mild DDD, partially cystic lesion of R kidney, and focal opacity in L mid lung) as well as (he said he was unable to walk to bathroom and was requiring 2+ assistance with therapy yesterday - with therapy recommending SNF), but it's not clear to me how he left the hospital (?if he had assistance or left under his own power -> per discussion with RN this AM he walked off floor).  He's represented with Victor Matthews recurrent fall/loss of consciousness.  PA [place] called to get my thoughts on patient, recommended completing syncope evaluation (EKG, orthostatics) for Victor Matthews and seeing if he can ambulate with PT eval.  If concerned he still needs admission based on his recurrent syncope and/or inability to ambulate, etc would page for admission.  Of note, there were some abnormal imaging findings that would need follow up if he is not admitted (focal opacity in L mid lung and partially cystic lesion of R kidney). ?

## 2021-08-20 NOTE — ED Notes (Signed)
After attempting to ambulate patient from [place], NT: ? ?Pt refused again. He said "I tried at home and that's how I fell. Y'all are really frustrating me and my legs hurt right now. So I can't do it." ?

## 2021-08-20 NOTE — Progress Notes (Signed)
RN called to room by patient regarding desire to leave AMA. After brief discussion about how it wasn't in his best interest to leave AMA, patient advised he was leaving regardless. 2 PIV's removed, AMA paper signed, APP notified of imminent departure by patient. ?

## 2021-08-20 NOTE — ED Notes (Signed)
Patient upset because we do not provide cab vouchers here. He said we do not care about him falling again. Patient taught on how to use a walker, demonstrated walker use safely and ambulated out of department. RN went over discharge instructions with patient and how social work will be reaching out to go over SNF placement and PT/OT. Patient verbalizes understanding.  ?

## 2021-08-21 ENCOUNTER — Telehealth: Payer: Self-pay

## 2021-08-21 NOTE — Telephone Encounter (Signed)
Called paitent re consult for Assistance with SNF placement. Discussed establishing himself on portal to get information, going to his PCP locally and having csw at the office work on placement. He understands he has a Fl2 already done, he left before faxed out. He can pick up medical records from hospital, or have PCP query for records.  ?

## 2021-08-23 ENCOUNTER — Other Ambulatory Visit (HOSPITAL_COMMUNITY): Payer: Self-pay | Admitting: *Deleted

## 2021-08-24 ENCOUNTER — Inpatient Hospital Stay (HOSPITAL_BASED_OUTPATIENT_CLINIC_OR_DEPARTMENT_OTHER)
Admission: EM | Admit: 2021-08-24 | Discharge: 2021-09-02 | DRG: 871 | Disposition: A | Discharge status: Skilled Nursing Facility | Payer: Medicare Other | Attending: Internal Medicine | Admitting: Internal Medicine

## 2021-08-24 ENCOUNTER — Inpatient Hospital Stay (HOSPITAL_COMMUNITY): Payer: Medicare Other

## 2021-08-24 ENCOUNTER — Other Ambulatory Visit: Payer: Self-pay

## 2021-08-24 ENCOUNTER — Emergency Department (HOSPITAL_BASED_OUTPATIENT_CLINIC_OR_DEPARTMENT_OTHER): Payer: Medicare Other

## 2021-08-24 ENCOUNTER — Encounter (HOSPITAL_BASED_OUTPATIENT_CLINIC_OR_DEPARTMENT_OTHER): Payer: Self-pay | Admitting: Emergency Medicine

## 2021-08-24 DIAGNOSIS — K6819 Other retroperitoneal abscess: Secondary | ICD-10-CM | POA: Diagnosis present

## 2021-08-24 DIAGNOSIS — W19XXXA Unspecified fall, initial encounter: Secondary | ICD-10-CM | POA: Diagnosis not present

## 2021-08-24 DIAGNOSIS — R262 Difficulty in walking, not elsewhere classified: Secondary | ICD-10-CM | POA: Diagnosis present

## 2021-08-24 DIAGNOSIS — N179 Acute kidney failure, unspecified: Secondary | ICD-10-CM | POA: Diagnosis not present

## 2021-08-24 DIAGNOSIS — E871 Hypo-osmolality and hyponatremia: Secondary | ICD-10-CM | POA: Diagnosis not present

## 2021-08-24 DIAGNOSIS — F419 Anxiety disorder, unspecified: Secondary | ICD-10-CM | POA: Diagnosis present

## 2021-08-24 DIAGNOSIS — E1100 Type 2 diabetes mellitus with hyperosmolarity without nonketotic hyperglycemic-hyperosmolar coma (NKHHC): Secondary | ICD-10-CM

## 2021-08-24 DIAGNOSIS — R7989 Other specified abnormal findings of blood chemistry: Secondary | ICD-10-CM | POA: Diagnosis not present

## 2021-08-24 DIAGNOSIS — I7389 Other specified peripheral vascular diseases: Secondary | ICD-10-CM | POA: Diagnosis present

## 2021-08-24 DIAGNOSIS — F17210 Nicotine dependence, cigarettes, uncomplicated: Secondary | ICD-10-CM | POA: Diagnosis present

## 2021-08-24 DIAGNOSIS — R0789 Other chest pain: Secondary | ICD-10-CM | POA: Diagnosis not present

## 2021-08-24 DIAGNOSIS — D649 Anemia, unspecified: Secondary | ICD-10-CM

## 2021-08-24 DIAGNOSIS — J918 Pleural effusion in other conditions classified elsewhere: Secondary | ICD-10-CM | POA: Diagnosis present

## 2021-08-24 DIAGNOSIS — E876 Hypokalemia: Secondary | ICD-10-CM | POA: Diagnosis present

## 2021-08-24 DIAGNOSIS — N151 Renal and perinephric abscess: Secondary | ICD-10-CM

## 2021-08-24 DIAGNOSIS — G9341 Metabolic encephalopathy: Secondary | ICD-10-CM | POA: Diagnosis present

## 2021-08-24 DIAGNOSIS — R296 Repeated falls: Secondary | ICD-10-CM | POA: Diagnosis present

## 2021-08-24 DIAGNOSIS — E1042 Type 1 diabetes mellitus with diabetic polyneuropathy: Secondary | ICD-10-CM | POA: Diagnosis present

## 2021-08-24 DIAGNOSIS — E1065 Type 1 diabetes mellitus with hyperglycemia: Secondary | ICD-10-CM | POA: Diagnosis present

## 2021-08-24 DIAGNOSIS — E860 Dehydration: Secondary | ICD-10-CM

## 2021-08-24 DIAGNOSIS — R911 Solitary pulmonary nodule: Secondary | ICD-10-CM

## 2021-08-24 DIAGNOSIS — N281 Cyst of kidney, acquired: Secondary | ICD-10-CM | POA: Diagnosis present

## 2021-08-24 DIAGNOSIS — I10 Essential (primary) hypertension: Secondary | ICD-10-CM | POA: Diagnosis present

## 2021-08-24 DIAGNOSIS — E878 Other disorders of electrolyte and fluid balance, not elsewhere classified: Secondary | ICD-10-CM

## 2021-08-24 DIAGNOSIS — R509 Fever, unspecified: Secondary | ICD-10-CM | POA: Diagnosis not present

## 2021-08-24 DIAGNOSIS — F319 Bipolar disorder, unspecified: Secondary | ICD-10-CM | POA: Diagnosis present

## 2021-08-24 DIAGNOSIS — R918 Other nonspecific abnormal finding of lung field: Secondary | ICD-10-CM | POA: Diagnosis not present

## 2021-08-24 DIAGNOSIS — Z1624 Resistance to multiple antibiotics: Secondary | ICD-10-CM | POA: Diagnosis present

## 2021-08-24 DIAGNOSIS — R079 Chest pain, unspecified: Secondary | ICD-10-CM

## 2021-08-24 DIAGNOSIS — A4151 Sepsis due to Escherichia coli [E. coli]: Secondary | ICD-10-CM | POA: Diagnosis not present

## 2021-08-24 DIAGNOSIS — Z794 Long term (current) use of insulin: Secondary | ICD-10-CM | POA: Diagnosis not present

## 2021-08-24 DIAGNOSIS — F4321 Adjustment disorder with depressed mood: Secondary | ICD-10-CM

## 2021-08-24 DIAGNOSIS — F39 Unspecified mood [affective] disorder: Secondary | ICD-10-CM

## 2021-08-24 DIAGNOSIS — G894 Chronic pain syndrome: Secondary | ICD-10-CM | POA: Diagnosis present

## 2021-08-24 DIAGNOSIS — F17200 Nicotine dependence, unspecified, uncomplicated: Secondary | ICD-10-CM

## 2021-08-24 DIAGNOSIS — Z8616 Personal history of COVID-19: Secondary | ICD-10-CM

## 2021-08-24 DIAGNOSIS — J90 Pleural effusion, not elsewhere classified: Secondary | ICD-10-CM

## 2021-08-24 DIAGNOSIS — A419 Sepsis, unspecified organism: Secondary | ICD-10-CM

## 2021-08-24 LAB — RAPID URINE DRUG SCREEN, HOSP PERFORMED
Amphetamines: NOT DETECTED
Barbiturates: NOT DETECTED
Benzodiazepines: NOT DETECTED
Cocaine: NOT DETECTED
Opiates: NOT DETECTED
Tetrahydrocannabinol: NOT DETECTED

## 2021-08-24 LAB — CBC WITH DIFFERENTIAL/PLATELET
Abs Immature Granulocytes: 0.05 10*3/uL (ref 0.00–0.07)
Basophils Absolute: 0 10*3/uL (ref 0.0–0.1)
Basophils Relative: 0 %
Eosinophils Absolute: 0 10*3/uL (ref 0.0–0.5)
Eosinophils Relative: 0 %
HCT: 30.8 % — ABNORMAL LOW (ref 39.0–52.0)
Hemoglobin: 10.7 g/dL — ABNORMAL LOW (ref 13.0–17.0)
Immature Granulocytes: 1 %
Lymphocytes Relative: 5 %
Lymphs Abs: 0.6 10*3/uL — ABNORMAL LOW (ref 0.7–4.0)
MCH: 27.2 pg (ref 26.0–34.0)
MCHC: 34.7 g/dL (ref 30.0–36.0)
MCV: 78.4 fL — ABNORMAL LOW (ref 80.0–100.0)
Monocytes Absolute: 0.6 10*3/uL (ref 0.1–1.0)
Monocytes Relative: 6 %
Neutro Abs: 9.6 10*3/uL — ABNORMAL HIGH (ref 1.7–7.7)
Neutrophils Relative %: 88 %
Platelets: 262 10*3/uL (ref 150–400)
RBC: 3.93 MIL/uL — ABNORMAL LOW (ref 4.22–5.81)
RDW: 13.1 % (ref 11.5–15.5)
WBC: 10.9 10*3/uL — ABNORMAL HIGH (ref 4.0–10.5)
nRBC: 0 % (ref 0.0–0.2)

## 2021-08-24 LAB — COMPREHENSIVE METABOLIC PANEL
ALT: 12 U/L (ref 0–44)
AST: 21 U/L (ref 15–41)
Albumin: 3.2 g/dL — ABNORMAL LOW (ref 3.5–5.0)
Alkaline Phosphatase: 150 U/L — ABNORMAL HIGH (ref 38–126)
Anion gap: 13 (ref 5–15)
BUN: 23 mg/dL — ABNORMAL HIGH (ref 6–20)
CO2: 25 mmol/L (ref 22–32)
Calcium: 9.3 mg/dL (ref 8.9–10.3)
Chloride: 90 mmol/L — ABNORMAL LOW (ref 98–111)
Creatinine, Ser: 2.16 mg/dL — ABNORMAL HIGH (ref 0.61–1.24)
GFR, Estimated: 40 mL/min — ABNORMAL LOW (ref >60)
Glucose, Bld: 715 mg/dL (ref 70–99)
Potassium: 3.8 mmol/L (ref 3.5–5.1)
Sodium: 128 mmol/L — ABNORMAL LOW (ref 135–145)
Total Bilirubin: 0.6 mg/dL (ref 0.3–1.2)
Total Protein: 9.3 g/dL — ABNORMAL HIGH (ref 6.5–8.1)

## 2021-08-24 LAB — URINALYSIS, ROUTINE W REFLEX MICROSCOPIC
Bilirubin Urine: NEGATIVE
Glucose, UA: >=500 mg/dL — AB
Ketones, ur: NEGATIVE mg/dL
Leukocytes,Ua: NEGATIVE
Nitrite: NEGATIVE
Protein, ur: NEGATIVE mg/dL
Specific Gravity, Urine: 1.01 (ref 1.005–1.030)
pH: 6.5 (ref 5.0–8.0)

## 2021-08-24 LAB — LACTIC ACID, PLASMA
Lactic Acid, Venous: 2 mmol/L (ref 0.5–1.9)
Lactic Acid, Venous: 2.3 mmol/L (ref 0.5–1.9)

## 2021-08-24 LAB — BASIC METABOLIC PANEL
Anion gap: 10 (ref 5–15)
Anion gap: 8 (ref 5–15)
Anion gap: 7 (ref 5–15)
BUN: 19 mg/dL (ref 6–20)
BUN: 16 mg/dL (ref 6–20)
BUN: 15 mg/dL (ref 6–20)
CO2: 28 mmol/L (ref 22–32)
CO2: 26 mmol/L (ref 22–32)
CO2: 25 mmol/L (ref 22–32)
Calcium: 9.1 mg/dL (ref 8.9–10.3)
Calcium: 8.6 mg/dL — ABNORMAL LOW (ref 8.9–10.3)
Calcium: 8.4 mg/dL — ABNORMAL LOW (ref 8.9–10.3)
Chloride: 99 mmol/L (ref 98–111)
Chloride: 99 mmol/L (ref 98–111)
Chloride: 102 mmol/L (ref 98–111)
Creatinine, Ser: 1.72 mg/dL — ABNORMAL HIGH (ref 0.61–1.24)
Creatinine, Ser: 1.47 mg/dL — ABNORMAL HIGH (ref 0.61–1.24)
Creatinine, Ser: 1.48 mg/dL — ABNORMAL HIGH (ref 0.61–1.24)
GFR, Estimated: 53 mL/min — ABNORMAL LOW (ref >60)
GFR, Estimated: >60 mL/min (ref >60)
GFR, Estimated: >60 mL/min (ref >60)
Glucose, Bld: 156 mg/dL — ABNORMAL HIGH (ref 70–99)
Glucose, Bld: 247 mg/dL — ABNORMAL HIGH (ref 70–99)
Glucose, Bld: 167 mg/dL — ABNORMAL HIGH (ref 70–99)
Potassium: 3.4 mmol/L — ABNORMAL LOW (ref 3.5–5.1)
Potassium: 3.3 mmol/L — ABNORMAL LOW (ref 3.5–5.1)
Potassium: 3.4 mmol/L — ABNORMAL LOW (ref 3.5–5.1)
Sodium: 137 mmol/L (ref 135–145)
Sodium: 133 mmol/L — ABNORMAL LOW (ref 135–145)
Sodium: 134 mmol/L — ABNORMAL LOW (ref 135–145)

## 2021-08-24 LAB — GLUCOSE, CAPILLARY
Glucose-Capillary: 158 mg/dL — ABNORMAL HIGH (ref 70–99)
Glucose-Capillary: 183 mg/dL — ABNORMAL HIGH (ref 70–99)
Glucose-Capillary: 236 mg/dL — ABNORMAL HIGH (ref 70–99)
Glucose-Capillary: 215 mg/dL — ABNORMAL HIGH (ref 70–99)
Glucose-Capillary: 248 mg/dL — ABNORMAL HIGH (ref 70–99)
Glucose-Capillary: 226 mg/dL — ABNORMAL HIGH (ref 70–99)
Glucose-Capillary: 202 mg/dL — ABNORMAL HIGH (ref 70–99)
Glucose-Capillary: 164 mg/dL — ABNORMAL HIGH (ref 70–99)

## 2021-08-24 LAB — URINALYSIS, MICROSCOPIC (REFLEX)

## 2021-08-24 LAB — BETA-HYDROXYBUTYRIC ACID: Beta-Hydroxybutyric Acid: 0.17 mmol/L (ref 0.05–0.27)

## 2021-08-24 LAB — I-STAT VENOUS BLOOD GAS, ED
Acid-Base Excess: 2 mmol/L (ref 0.0–2.0)
Bicarbonate: 27.6 mmol/L (ref 20.0–28.0)
Calcium, Ion: 1.21 mmol/L (ref 1.15–1.40)
HCT: 36 % — ABNORMAL LOW (ref 39.0–52.0)
Hemoglobin: 12.2 g/dL — ABNORMAL LOW (ref 13.0–17.0)
O2 Saturation: 23 %
Potassium: 3.8 mmol/L (ref 3.5–5.1)
Sodium: 129 mmol/L — ABNORMAL LOW (ref 135–145)
TCO2: 29 mmol/L (ref 22–32)
pCO2, Ven: 45.8 mmHg (ref 44–60)
pH, Ven: 7.389 (ref 7.25–7.43)
pO2, Ven: 17 mmHg — CL (ref 32–45)

## 2021-08-24 LAB — LIPASE, BLOOD: Lipase: 29 U/L (ref 11–51)

## 2021-08-24 LAB — CBG MONITORING, ED
Glucose-Capillary: >600 mg/dL (ref 70–99)
Glucose-Capillary: 505 mg/dL (ref 70–99)
Glucose-Capillary: 441 mg/dL — ABNORMAL HIGH (ref 70–99)
Glucose-Capillary: 301 mg/dL — ABNORMAL HIGH (ref 70–99)
Glucose-Capillary: 234 mg/dL — ABNORMAL HIGH (ref 70–99)

## 2021-08-24 LAB — TROPONIN I (HIGH SENSITIVITY)
Troponin I (High Sensitivity): 6 ng/L (ref <18)
Troponin I (High Sensitivity): 6 ng/L (ref <18)

## 2021-08-24 LAB — D-DIMER, QUANTITATIVE: D-Dimer, Quant: 3.65 ug/mL-FEU — ABNORMAL HIGH (ref 0.00–0.50)

## 2021-08-24 LAB — OSMOLALITY: Osmolality: 295 mOsm/kg (ref 275–295)

## 2021-08-24 LAB — MRSA NEXT GEN BY PCR, NASAL: MRSA by PCR Next Gen: NOT DETECTED

## 2021-08-24 IMAGING — DX DG HIP (WITH OR WITHOUT PELVIS) 2-3V*R*
3 series · 3 of 3 positions shown · non-contrast
Comparison: None

CLINICAL DATA: Fall, fell twice today, dizziness, RIGHT leg pain
post fall hyperglycemia

EXAM:
DG HIP (WITH OR WITHOUT PELVIS) 2-3V RIGHT

[pelvis ap]
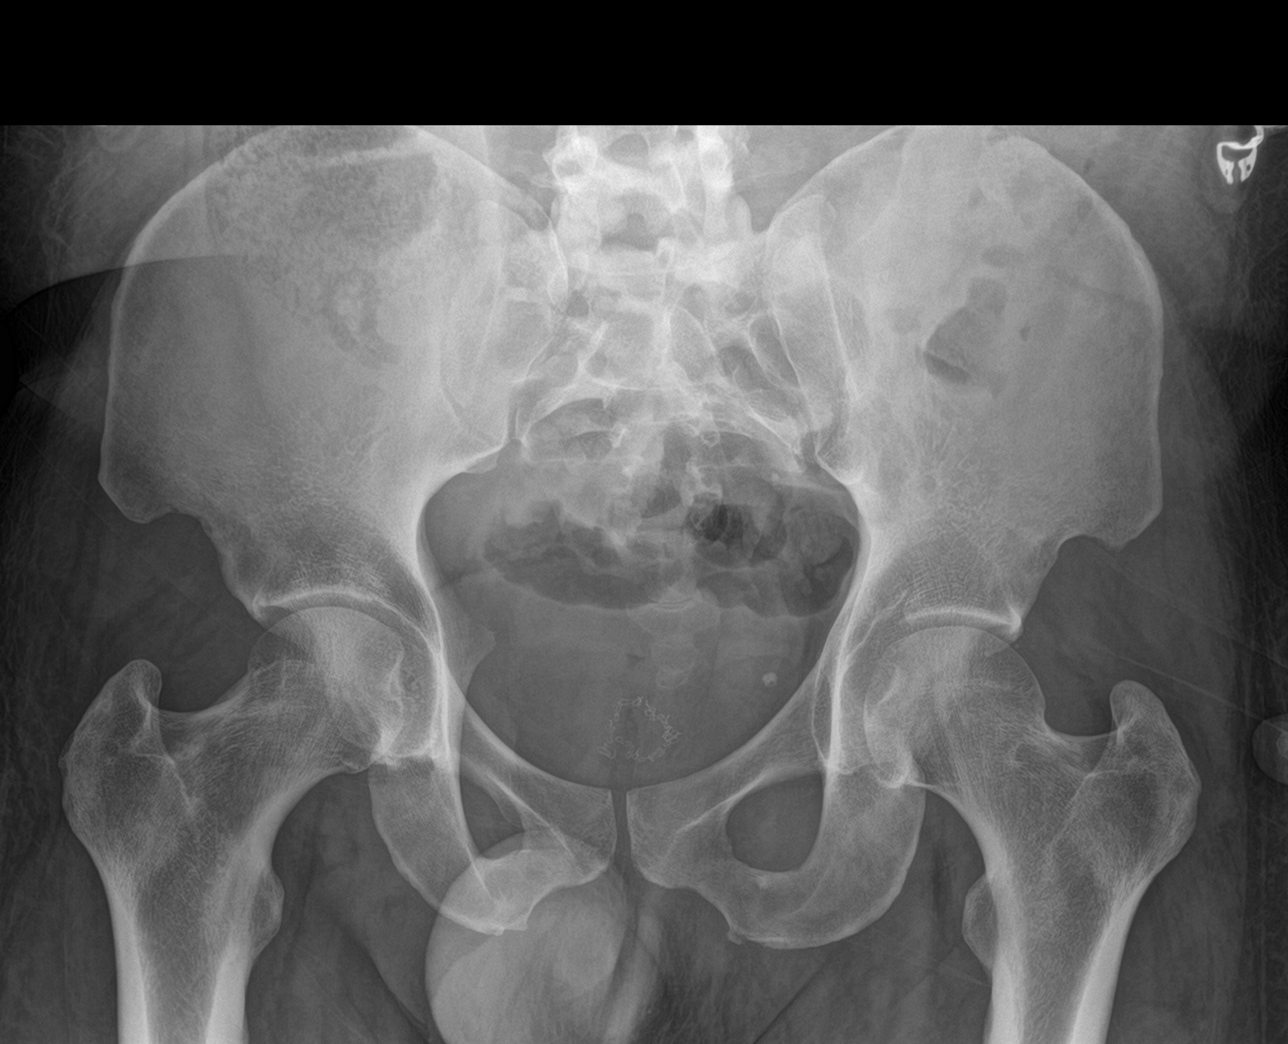

[hip ap]
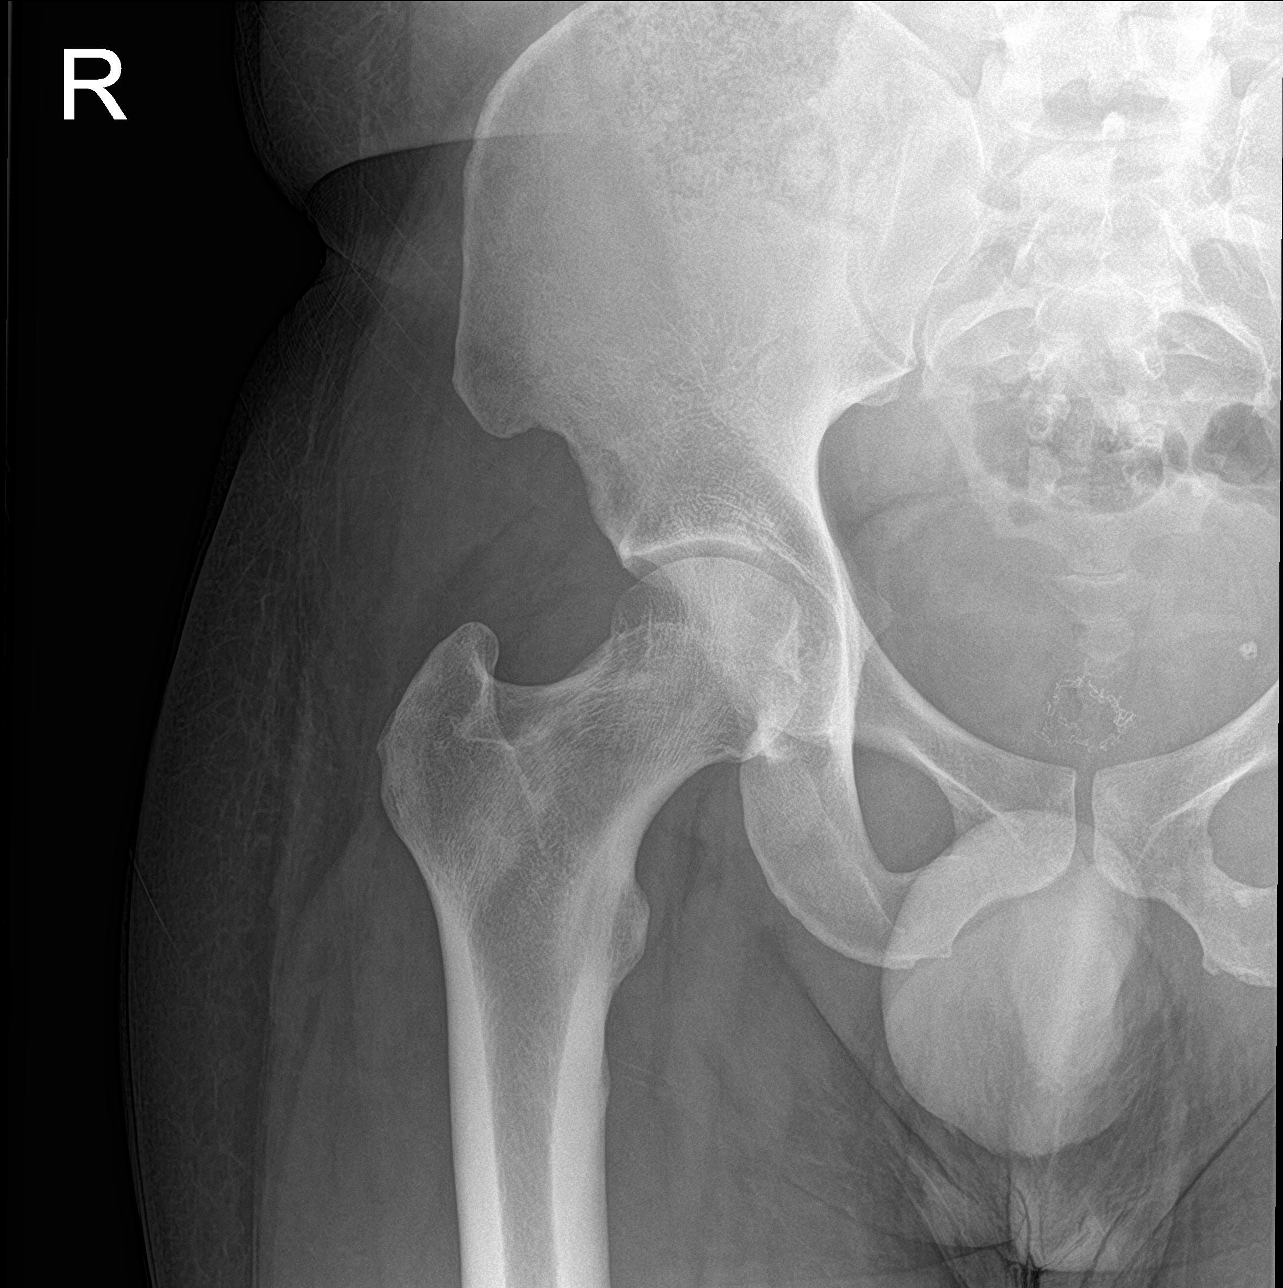

[hip lat]
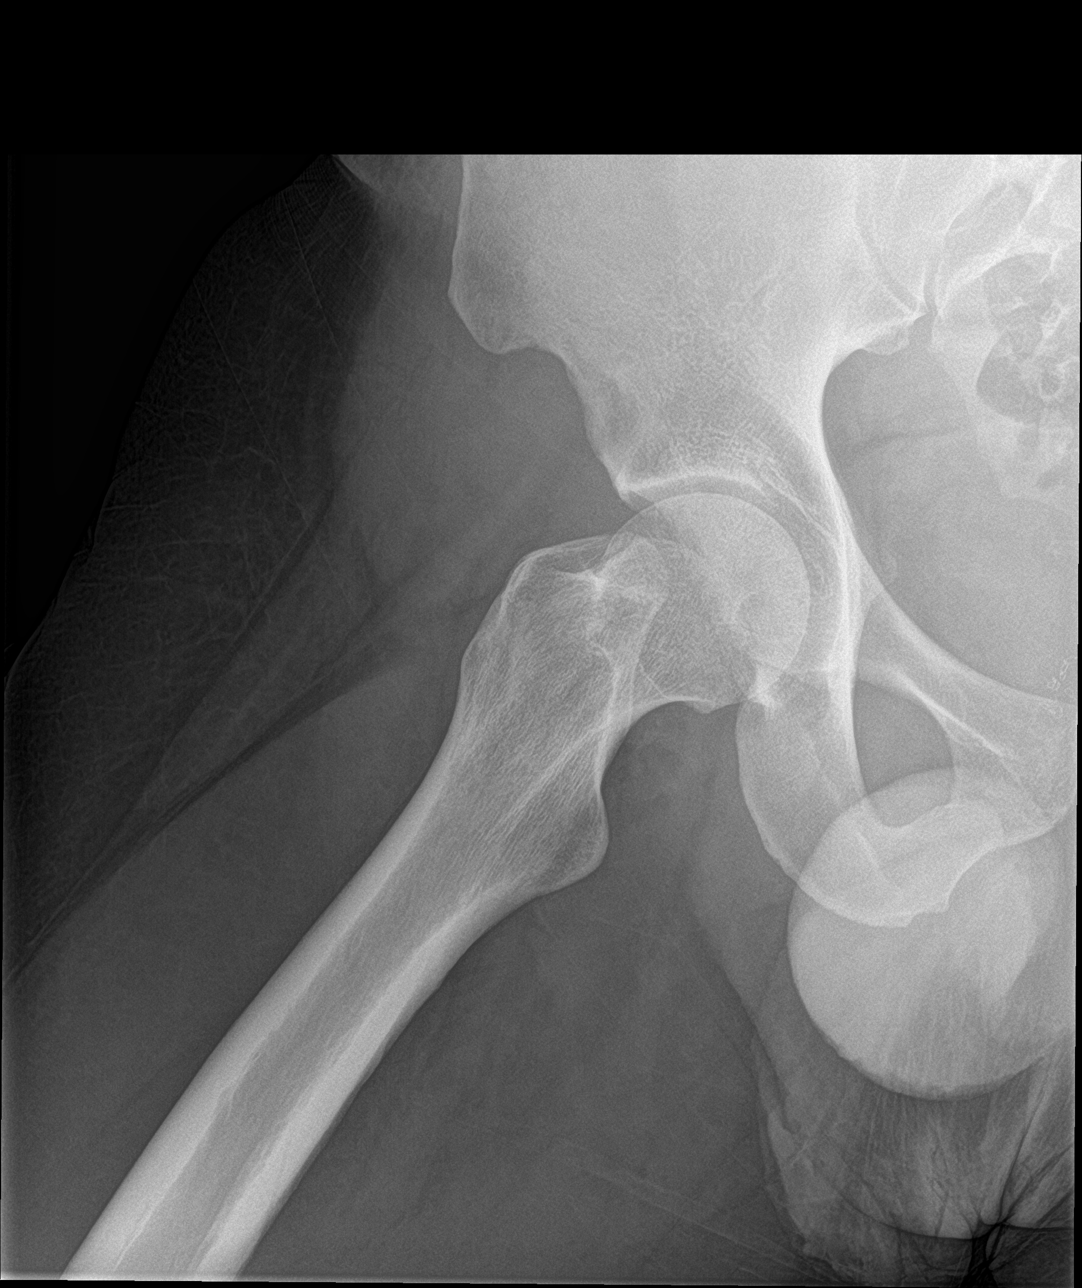

[3 of 3 positions shown; findings below may reference images not displayed]

FINDINGS: Osseous mineralization normal.

Hip and SI joint spaces preserved.

No acute fracture, dislocation, or bone destruction.
IMPRESSION: No acute osseous abnormalities.

## 2021-08-24 IMAGING — DX DG KNEE COMPLETE 4+V*R*
4 series · 4 of 4 positions shown · non-contrast
Comparison: None.

CLINICAL DATA: Hyperglycemia, fall, leg pain

EXAM:
RIGHT KNEE - COMPLETE 4+ VIEW

[knee ap]
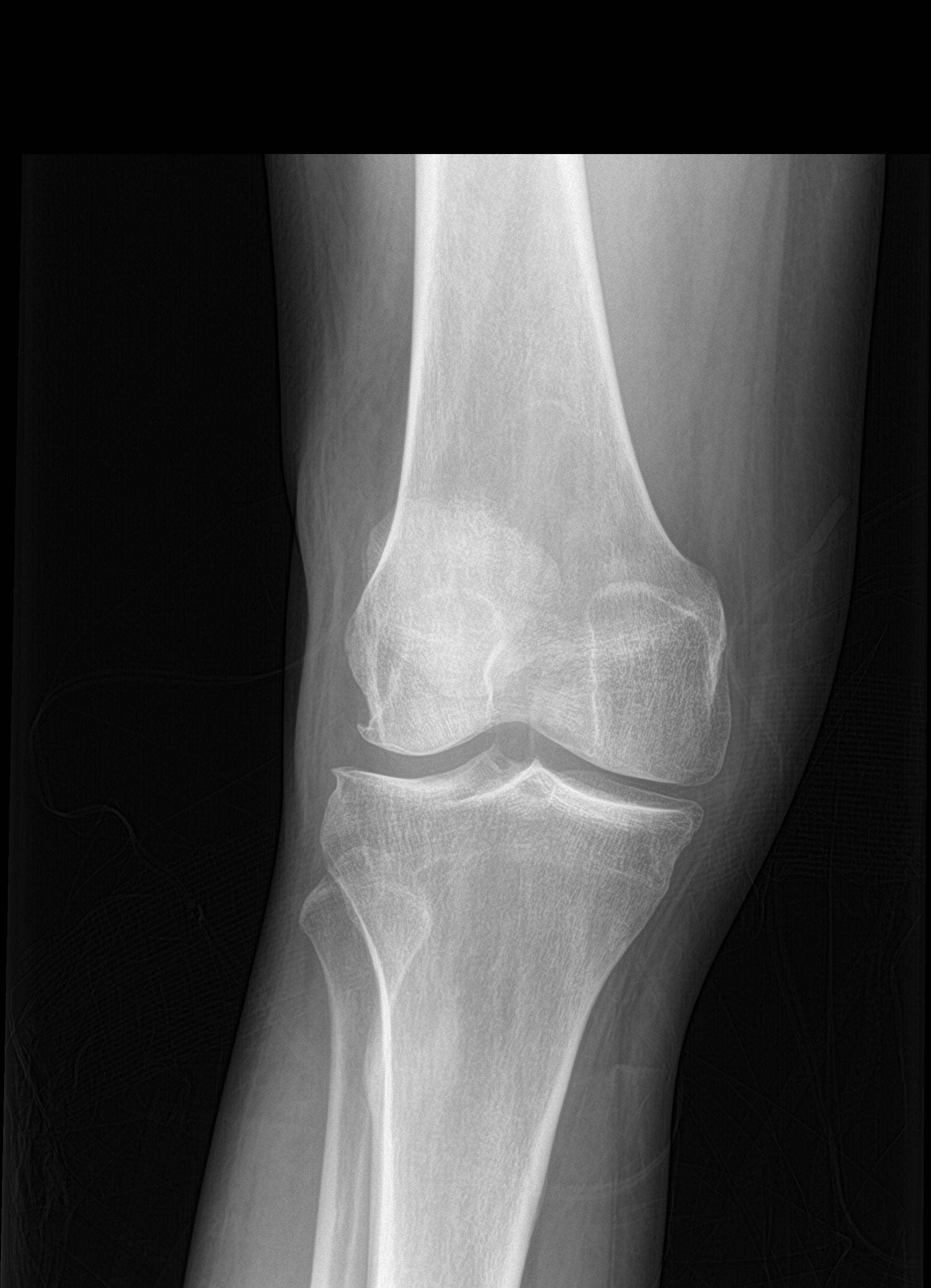

[knee lat]
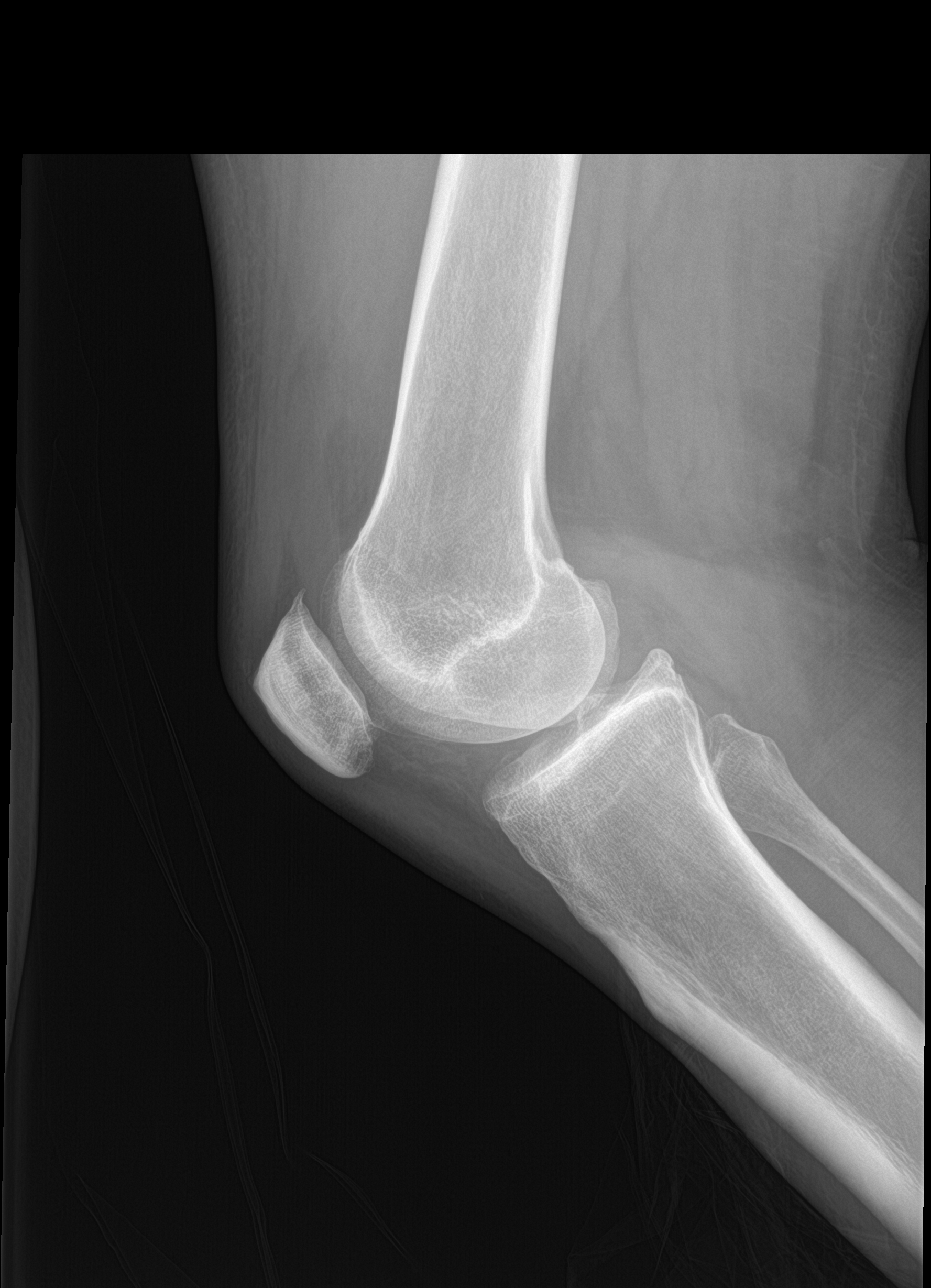

[knee obl (1 of 2)]
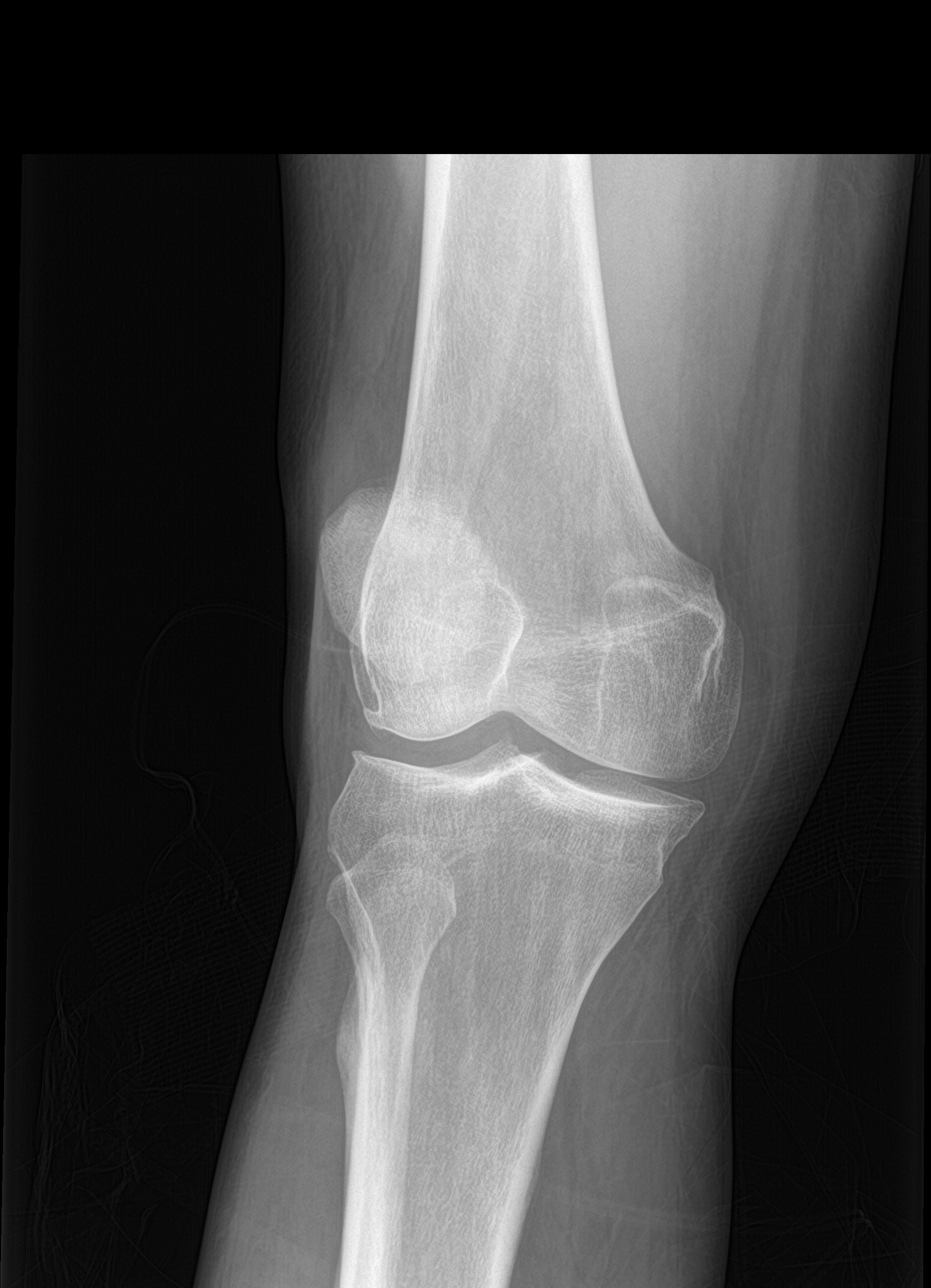

[knee obl (2 of 2)]
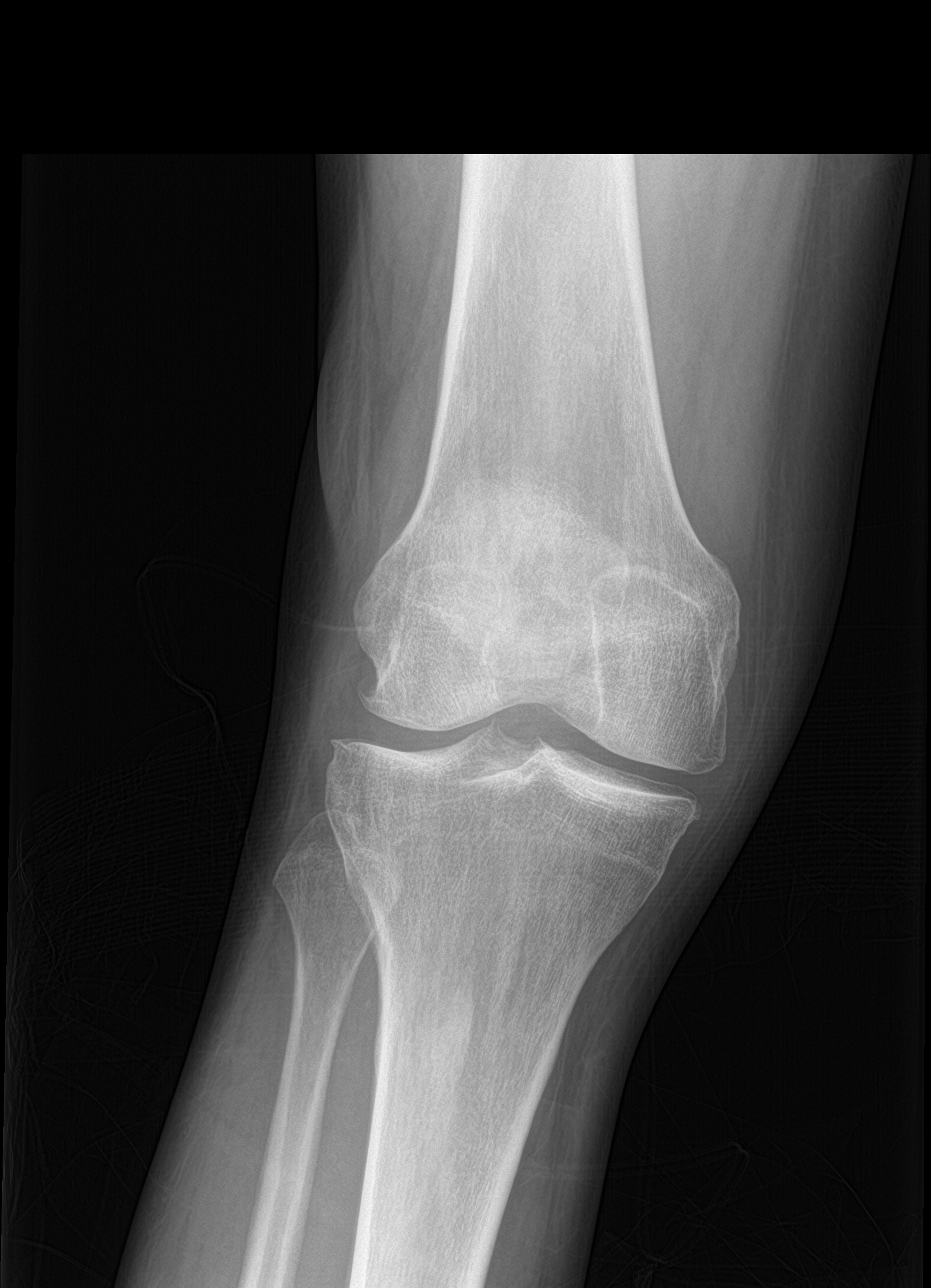

[4 of 4 positions shown; findings below may reference images not displayed]

FINDINGS: Normal alignment without acute osseous finding, fracture,
subluxation dislocation. No large effusion on the lateral view.
Minor degenerative changes of the lateral compartment with bony
spurring noted.
IMPRESSION: Mild right knee degenerative osteoarthritis. No acute osseous
finding.

## 2021-08-24 IMAGING — DX DG CHEST 1V PORT
1 series · 1 of 1 positions shown · non-contrast
Comparison: AP chest [DATE]

CLINICAL DATA: Fever.  Hyperglycemia.

EXAM:
PORTABLE CHEST 1 VIEW

[chest ap]
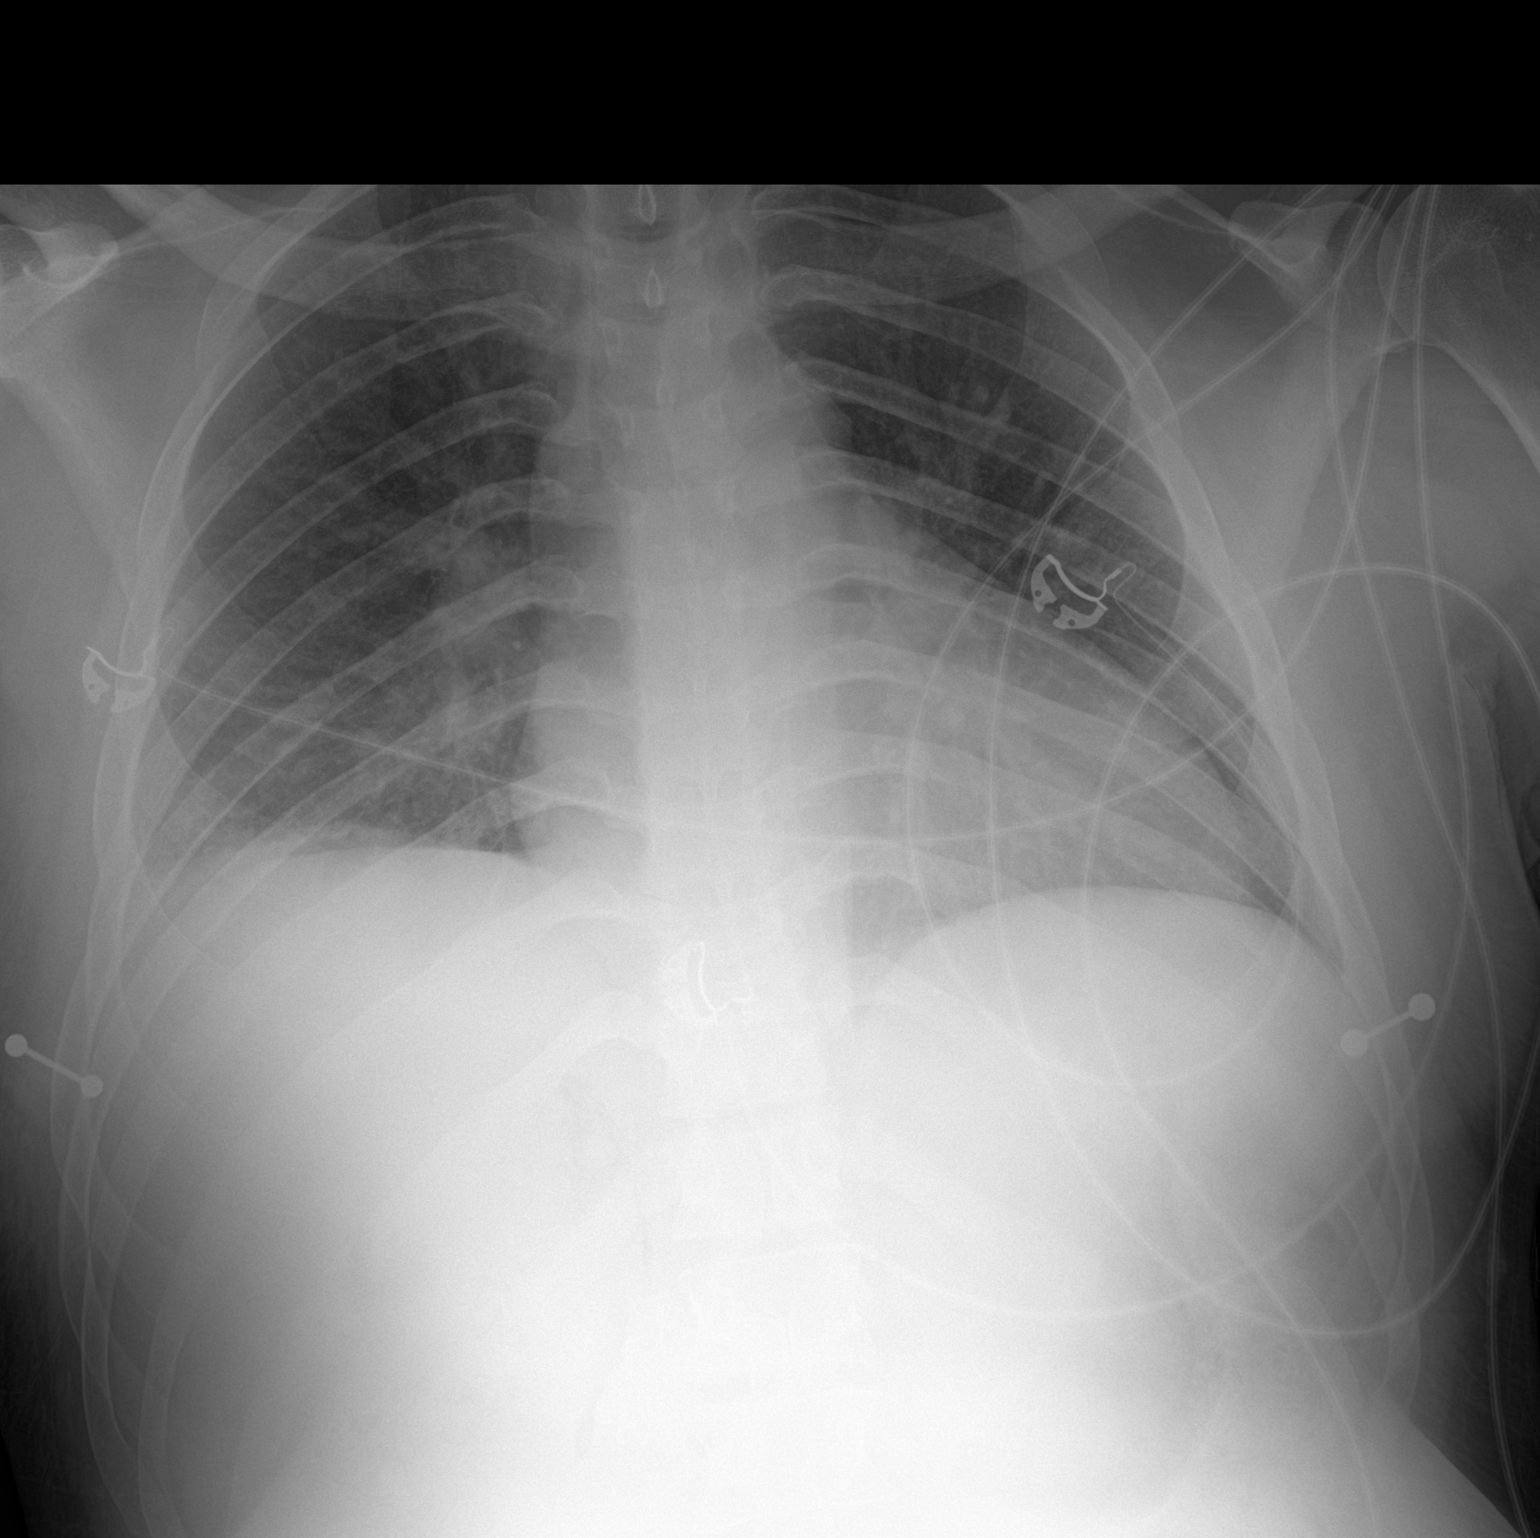

[1 of 1 positions shown; findings below may reference images not displayed]

FINDINGS: Cardiac silhouette and mediastinal contours are within normal
limits. Mildly decreased lung volumes. Right basilar linear
densities. The left lung appears clear. No definite pleural
effusion. No pneumothorax. No acute skeletal abnormality.
IMPRESSION: Decreased lung volumes with right basilar bronchovascular crowding.
This may represent subsegmental atelectasis. It is difficult to
exclude early pneumonia. If clinically indicated, this may be better
evaluated with PA and lateral views of the chest.

## 2021-08-24 IMAGING — US US RENAL
1 series · 13 of 25 positions shown · non-contrast
Comparison: Lumbar spine MRI [DATE]

CLINICAL DATA: Acute kidney injury. Evaluate right renal cyst seen
on lumbar spine MRI.

EXAM:
RENAL / URINARY TRACT ULTRASOUND COMPLETE

[Series 1: us renal · 13 of 54 slices shown]
[im 1/54]
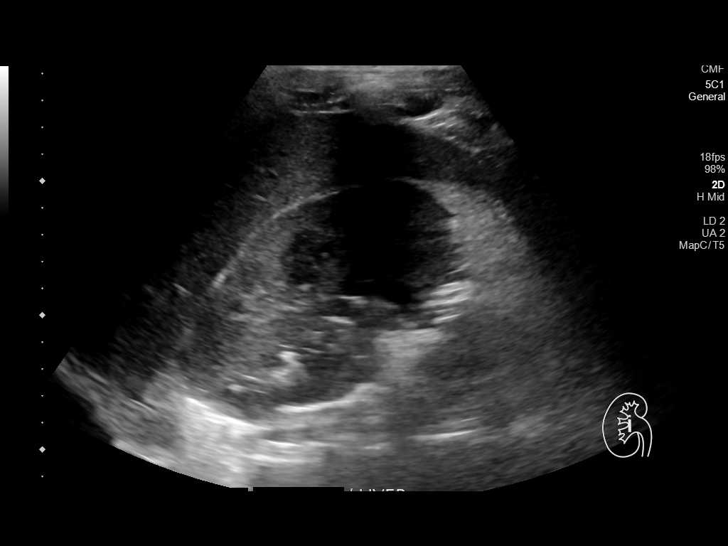
[im 5/54]
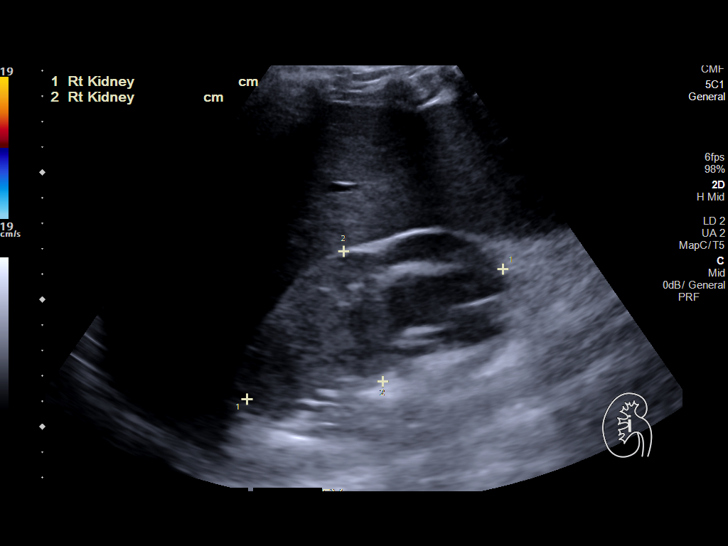
[im 9/54]
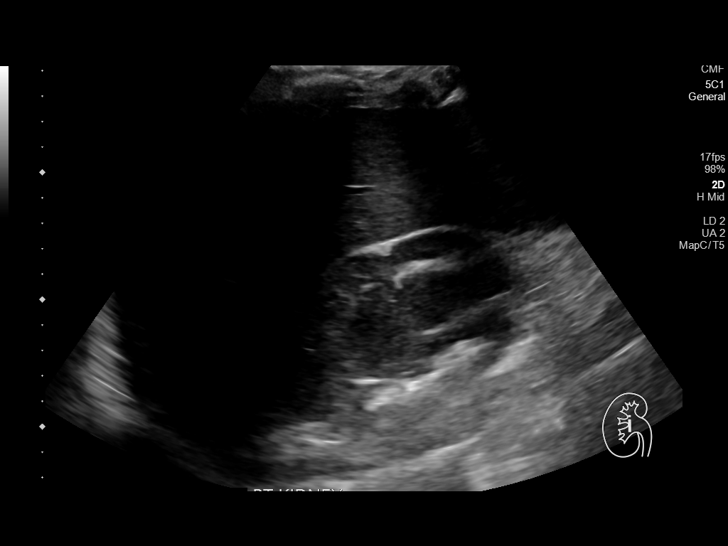
[im 14/54]
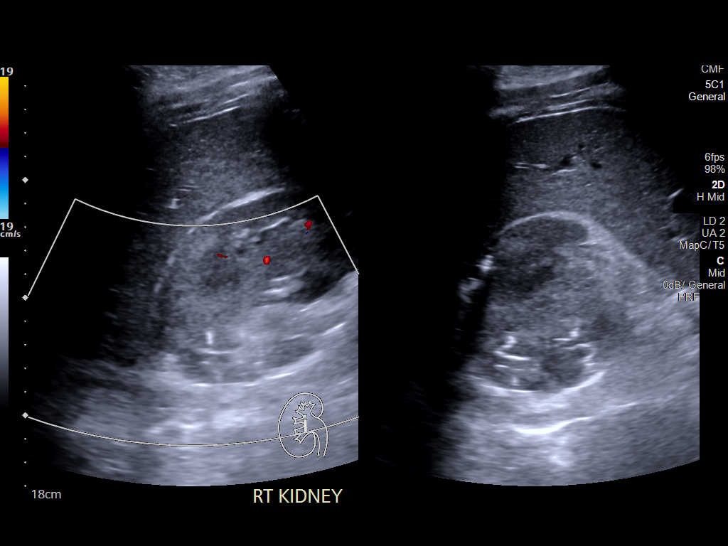
[im 18/54]
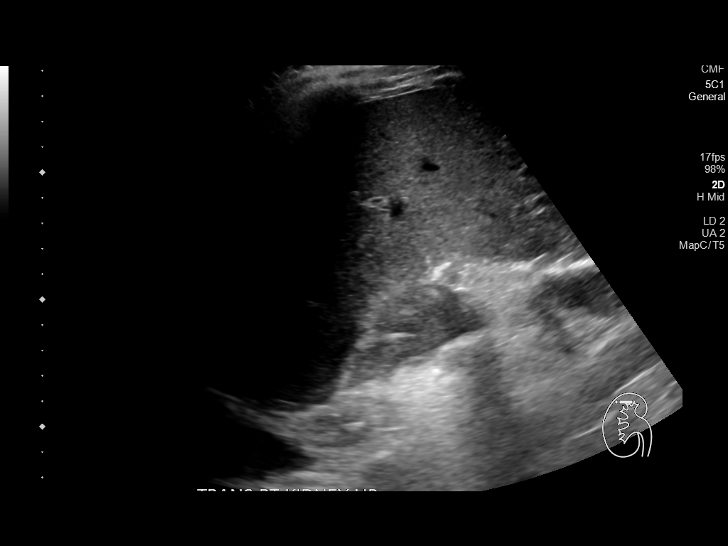
[im 23/54]
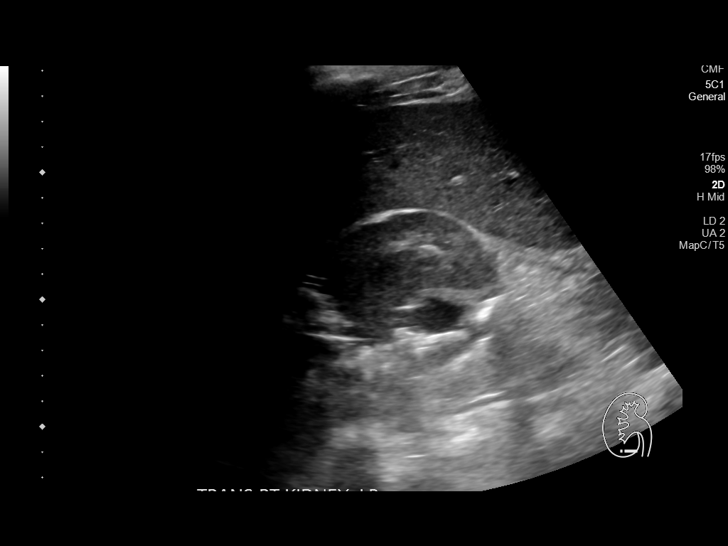
[im 27/54]
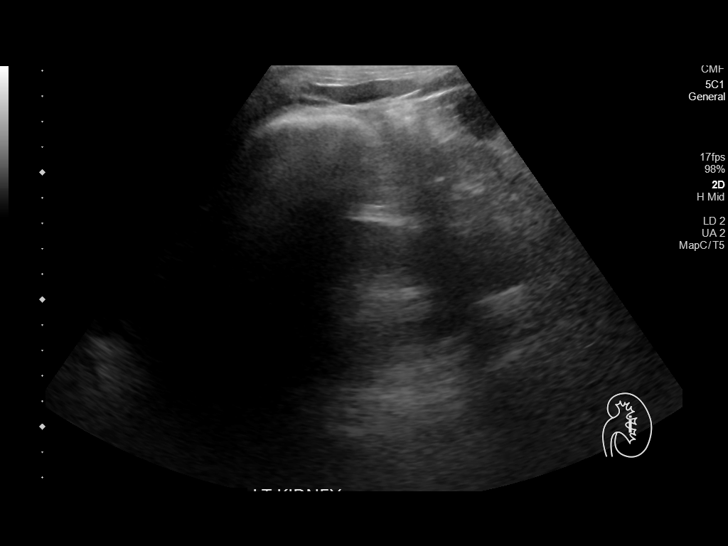
[im 31/54]
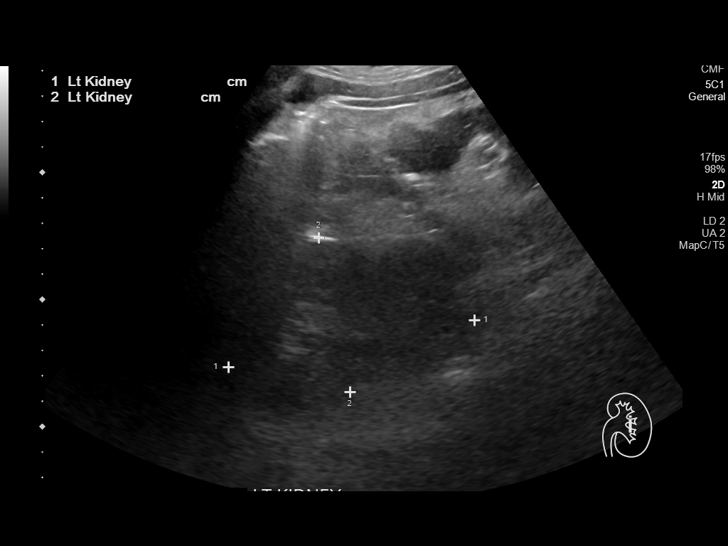
[im 36/54]
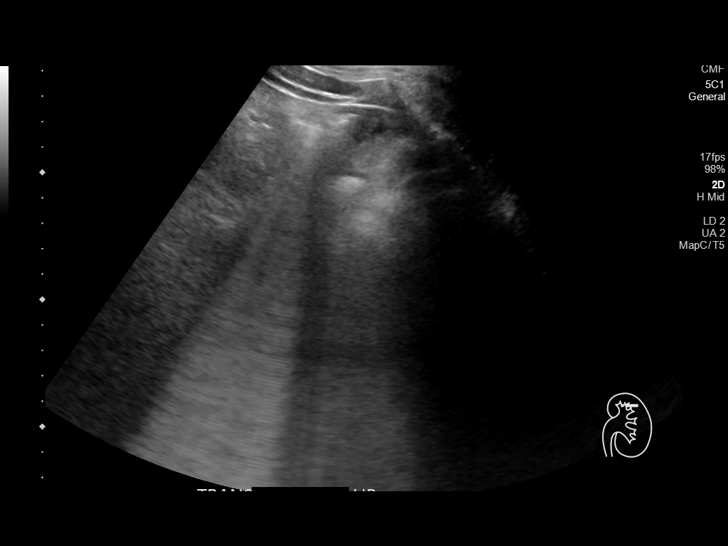
[im 40/54]
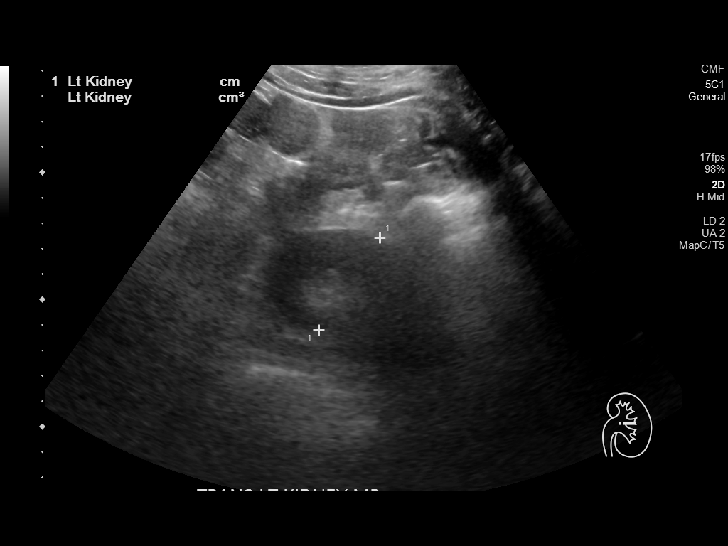
[im 45/54]
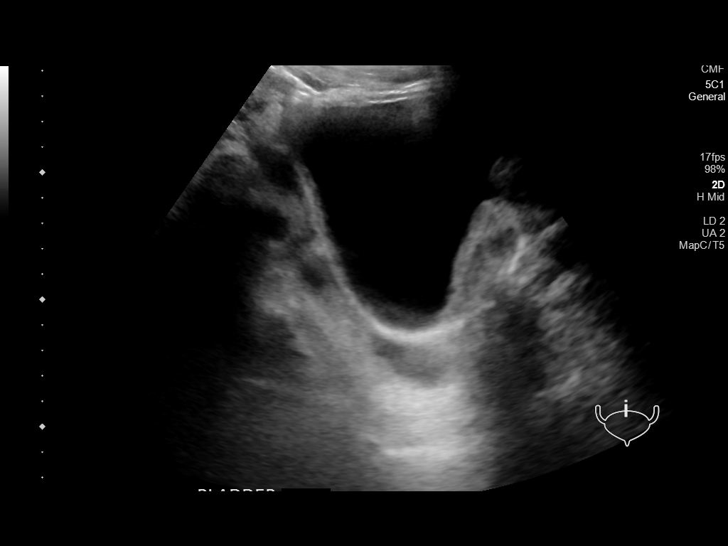
[im 49/54]
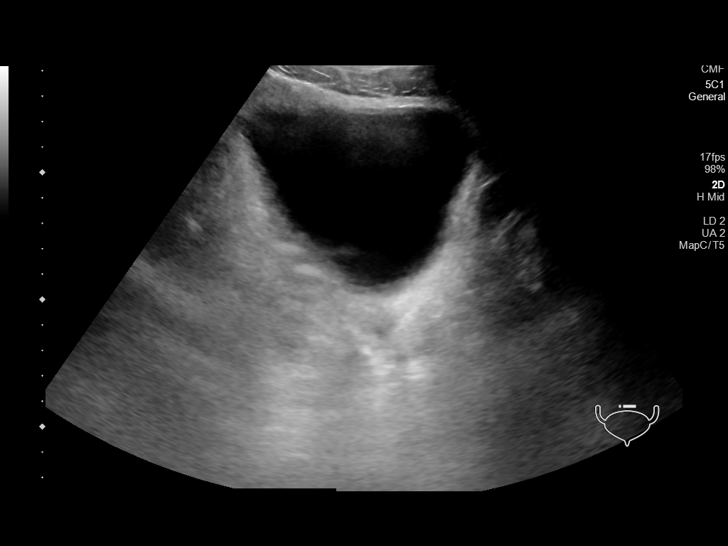
[im 54/54]
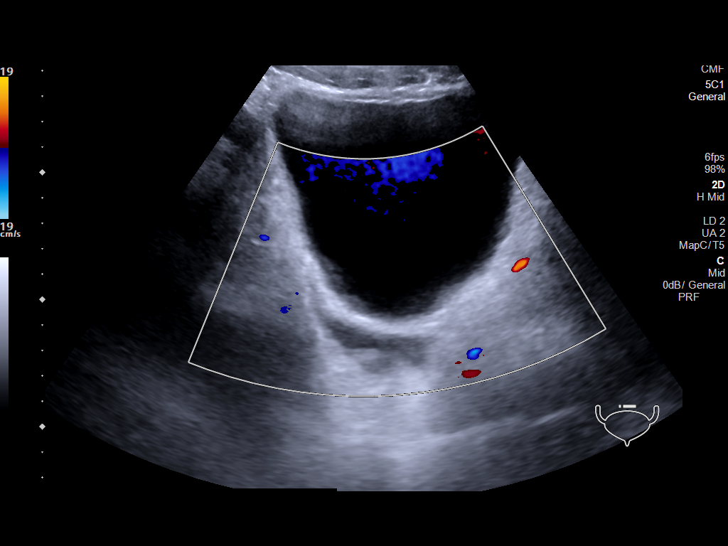

[13 of 25 positions shown; findings below may reference images not displayed]

FINDINGS: Right Kidney:

Renal measurements: 11.3 x 5.3 x 6.0 cm. = volume: 188 mL. Right
kidney is not well visualized. There is a poorly defined hypoechoic
structure in the right kidney that measures 2.4 x 2.1 x 2.4 cm and
not compatible with a simple cyst. Renal pelvis is likely prominent.
No definite caliceal dilatation.

Left Kidney:

Renal measurements: 9.9 x 6.2 x 4.4 cm = volume: 139 mL. Left kidney
is poorly characterized and obscured by bowel gas. No definite
hydronephrosis.

Bladder:

Appears normal for degree of bladder distention.

Other:

None.
IMPRESSION: 1. Limited examination of both kidneys.
2. Poorly defined hypoechoic lesion/structure in the right kidney
that may correspond with the complex lesion or cyst seen on the
previous MRI. Structure is indeterminate based on ultrasound. This
could represent an inflammatory or infectious etiology. Neoplastic
process cannot be completely excluded. Recommend further
characterization with abdominal MRI when the patient can tolerate
the procedure and ideally when the patient can receive IV contrast.
3. Right renal pelvis appears to be prominent but no definite
hydronephrosis.

## 2021-08-24 MED ORDER — NITROGLYCERIN 0.4 MG SL SUBL: 0.4000 mg | SUBLINGUAL_TABLET | SUBLINGUAL | Status: DC | PRN

## 2021-08-24 MED ORDER — SODIUM CHLORIDE 0.9 % IV SOLN: INTRAVENOUS | Status: DC | PRN

## 2021-08-24 MED ORDER — DEXTROSE IN LACTATED RINGERS 5 % IV SOLN: INTRAVENOUS | Status: DC

## 2021-08-24 MED ORDER — SODIUM CHLORIDE 0.9 % IV SOLN
500.0000 mg | INTRAVENOUS | Status: DC
  Administered 2021-08-24: 500 mg via INTRAVENOUS
  Filled 2021-08-24 (×2): qty 5

## 2021-08-24 MED ORDER — SODIUM CHLORIDE 0.9 % IV SOLN
2.0000 g | Freq: Every day | INTRAVENOUS | Status: DC
  Administered 2021-08-24 – 2021-08-25 (×2): 2 g via INTRAVENOUS
  Filled 2021-08-24 (×2): qty 20

## 2021-08-24 MED ORDER — MORPHINE SULFATE (PF) 4 MG/ML IV SOLN
4.0000 mg | Freq: Once | INTRAVENOUS | Status: AC
  Administered 2021-08-24: 4 mg via INTRAVENOUS
  Filled 2021-08-24: qty 1

## 2021-08-24 MED ORDER — CHLORHEXIDINE GLUCONATE CLOTH 2 % EX PADS
6.0000 | MEDICATED_PAD | Freq: Every day | CUTANEOUS | Status: DC
  Administered 2021-08-24 – 2021-09-02 (×9): 6 via TOPICAL

## 2021-08-24 MED ORDER — DEXTROSE 50 % IV SOLN
0.0000 mL | INTRAVENOUS | Status: DC | PRN
  Filled 2021-08-24: qty 50

## 2021-08-24 MED ORDER — POTASSIUM CHLORIDE 10 MEQ/100ML IV SOLN
INTRAVENOUS | Status: AC
  Filled 2021-08-24: qty 100

## 2021-08-24 MED ORDER — HYDROXYZINE HCL 10 MG PO TABS
10.0000 mg | ORAL_TABLET | Freq: Three times a day (TID) | ORAL | Status: AC | PRN
  Administered 2021-08-25: 10 mg via ORAL
  Filled 2021-08-24: qty 1

## 2021-08-24 MED ORDER — SODIUM CHLORIDE 0.9 % IV BOLUS
1000.0000 mL | Freq: Once | INTRAVENOUS | Status: AC
  Administered 2021-08-24: 1000 mL via INTRAVENOUS

## 2021-08-24 MED ORDER — DEXTROSE-NACL 5-0.9 % IV SOLN: INTRAVENOUS | Status: DC

## 2021-08-24 MED ORDER — PREGABALIN 100 MG PO CAPS
100.0000 mg | ORAL_CAPSULE | ORAL | Status: DC
  Filled 2021-08-24: qty 1

## 2021-08-24 MED ORDER — LABETALOL HCL 5 MG/ML IV SOLN
10.0000 mg | INTRAVENOUS | Status: DC | PRN
  Administered 2021-08-24 – 2021-08-25 (×2): 10 mg via INTRAVENOUS
  Filled 2021-08-24 (×3): qty 4

## 2021-08-24 MED ORDER — LACTATED RINGERS IV SOLN: INTRAVENOUS | Status: DC

## 2021-08-24 MED ORDER — ENOXAPARIN SODIUM 40 MG/0.4ML IJ SOSY
40.0000 mg | PREFILLED_SYRINGE | INTRAMUSCULAR | Status: DC
  Administered 2021-08-24 – 2021-08-25 (×2): 40 mg via SUBCUTANEOUS
  Filled 2021-08-24 (×2): qty 0.4

## 2021-08-24 MED ORDER — HYDRALAZINE HCL 20 MG/ML IJ SOLN
10.0000 mg | Freq: Four times a day (QID) | INTRAMUSCULAR | Status: DC | PRN
  Administered 2021-08-24 – 2021-08-25 (×2): 10 mg via INTRAVENOUS
  Filled 2021-08-24 (×2): qty 1

## 2021-08-24 MED ORDER — POTASSIUM CHLORIDE CRYS ER 20 MEQ PO TBCR
40.0000 meq | EXTENDED_RELEASE_TABLET | Freq: Once | ORAL | Status: AC
  Administered 2021-08-24: 40 meq via ORAL
  Filled 2021-08-24: qty 2

## 2021-08-24 MED ORDER — LACTATED RINGERS IV BOLUS
20.0000 mL/kg | Freq: Once | INTRAVENOUS | Status: AC
  Administered 2021-08-24: 1000 mL via INTRAVENOUS

## 2021-08-24 MED ORDER — DICLOFENAC SODIUM 1 % EX GEL
4.0000 g | Freq: Four times a day (QID) | CUTANEOUS | Status: DC
  Administered 2021-08-24 – 2021-08-29 (×7): 4 g via TOPICAL
  Filled 2021-08-24: qty 100

## 2021-08-24 MED ORDER — PREGABALIN 100 MG PO CAPS
200.0000 mg | ORAL_CAPSULE | Freq: Once | ORAL | Status: AC
  Administered 2021-08-24: 200 mg via ORAL
  Filled 2021-08-24: qty 2

## 2021-08-24 MED ORDER — POTASSIUM CHLORIDE 10 MEQ/100ML IV SOLN
10.0000 meq | INTRAVENOUS | Status: AC
  Administered 2021-08-24 (×2): 10 meq via INTRAVENOUS
  Filled 2021-08-24 (×2): qty 100

## 2021-08-24 MED ORDER — ACETAMINOPHEN 325 MG PO TABS
650.0000 mg | ORAL_TABLET | Freq: Four times a day (QID) | ORAL | Status: DC | PRN
  Administered 2021-08-24: 650 mg via ORAL
  Filled 2021-08-24: qty 2

## 2021-08-24 MED ORDER — POTASSIUM CHLORIDE 10 MEQ/100ML IV SOLN
10.0000 meq | INTRAVENOUS | Status: AC
  Administered 2021-08-24 (×4): 10 meq via INTRAVENOUS
  Filled 2021-08-24 (×4): qty 100

## 2021-08-24 MED ORDER — INSULIN REGULAR(HUMAN) IN NACL 100-0.9 UT/100ML-% IV SOLN
INTRAVENOUS | Status: DC
  Administered 2021-08-24: 4.6 [IU]/h via INTRAVENOUS
  Administered 2021-08-24: 9.5 [IU]/h via INTRAVENOUS
  Administered 2021-08-24: 6 [IU]/h via INTRAVENOUS
  Administered 2021-08-24: 6.5 [IU]/h via INTRAVENOUS
  Administered 2021-08-24: 2.4 [IU]/h via INTRAVENOUS
  Filled 2021-08-24: qty 100

## 2021-08-24 NOTE — Assessment & Plan Note (Addendum)
Likely from polypharmacy and infection.  Resolved. ?

## 2021-08-24 NOTE — Assessment & Plan Note (Addendum)
MRI of T-spine 4/6 had reported focal opacity in the left midlung ?-Follow CTA chest ?

## 2021-08-24 NOTE — Assessment & Plan Note (Addendum)
Suspect poor compliance contributing to this.  Does not meet criteria for HHS or DKA.  On Levemir 30 units daily and NovoLog 70/30 at 40 units 3 times daily at home?  A1c 14.6% ?Recent Labs  ?Lab 08/30/21 ?1222 08/30/21 ?1640 08/30/21 ?2126 08/31/21 ?0727 08/31/21 ?1149  ?GLUCAP 169* 141* 139* 88 108*  ?-Continue SSI-moderate. ?-Decrease NovoLog from 6-4 units 3 times daily with meals ?-Decrease Levemir from 32 to 25 units twice daily ?-Need to adjust home insulin on discharge ?-Appreciate input by diabetic coordinator. ? ?

## 2021-08-24 NOTE — ED Notes (Signed)
Spoke with Diabetic Nurse Coordinator  ?Also spoke with Case Management to request consult due to difficulty in obtaining insulin and supplies ?

## 2021-08-24 NOTE — H&P (Addendum)
?History and Physical  ? ? ?Victor Matthews [id number] DOB: 1985/08/19 DOA: 08/24/2021 ? ?PCP: System, Provider Not In  ? ?I have briefly reviewed patients previous medical reports in [facility]. ? ?Patient coming from: Home  ? ?Chief Complaint: High blood sugar, falls at home x2. ? ?HPI: Victor Matthews is a 36 year old male, lives with his godfather, moved from [place] to the [place] area recently, medical history significant for type I DM with peripheral neuropathy, chronic pain, anxiety, bipolar disorder, recent E. coli bacteremia in early February and COVID infection late February, ambulatory dysfunction since early 2023, hospitalized 08/17/2021 - 08/20/2021 for hyperglycemia and ambulatory dysfunction, left AMA, presented to [facility] ED with complaints of hyperglycemia and falls x2.  Patient seen and stepdown unit.  Lethargic but arousable.  Not sure if the history is fully accurate.  Reports compliance with CBG checks, diabetic diet and taking his insulins, reports that last night his CBG read high and so also this morning.  However he told the EDP that he ran out of his test strips and his blood sugars had been running high.  He stated that he took his Levemir and Humalog insulin this morning.  Overnight at around 1 AM, reportedly while ambulating to the bathroom with his cane, sustained a fall to the right side and fell again sometime later in the morning.  Denies head or neck injury or LOC.  Reports some right-sided pain.  No prodromal symptoms.  He states that he lost his balance and fell.  Volunteers to smoking 10 cigarettes/day, denies alcohol or drug abuse.  Denies any other complaints ? ?ED Course: Tachycardic in the 100s, initial blood pressure of 174/111 lab work significant for glucose 128, glucose 715, BUN 23, creatinine 2.16, hemoglobin 10.7, WBC 10.9, BOH 0.17, UDS negative, right hip and knee x-ray without acute abnormalities. ? ?Review of Systems:  ?All other systems reviewed  and apart from HPI, are negative. ? ?Past Medical History:  ?Diagnosis Date  ? Diabetes mellitus without complication ([place])   ? Neuropathy   ? ? ?Past Surgical History:  ?Procedure Laterality Date  ? HEMORRHOID SURGERY    ? ? ?Social History ? reports that he has been smoking cigarettes. He has never used smokeless tobacco. He reports current alcohol use. He reports that he does not use drugs. ? ?No Known Allergies ? ?History reviewed. No pertinent family history. ? ? ?Prior to Admission medications   ?Medication Sig Start Date End Date Taking? Authorizing Provider  ?FLUoxetine (PROZAC) 20 MG capsule Take 20 mg by mouth daily as needed (anxiety attacks).    [provider]  ?insulin aspart protamine- aspart (NOVOLOG MIX 70/30) (70-30) 100 UNIT/ML injection Inject 0.4 mLs (40 Units total) into the skin with breakfast, with lunch, and with evening meal. ?Patient not taking: Reported on 08/18/2021 08/09/21   Mesner, Corene Cornea, MD  ?insulin detemir (LEVEMIR) 100 UNIT/ML injection Inject 0.3 mLs (30 Units total) into the skin daily. ?Patient taking differently: Inject 40 Units into the skin at bedtime. 08/09/21   Mesner, Corene Cornea, MD  ?insulin lispro protamine-lispro (HUMALOG 75/25 MIX) (75-25) 100 UNIT/ML SUSP injection Inject 35 Units into the skin 3 (three) times daily with meals.    [provider]  ?pregabalin (LYRICA) 300 MG capsule Take 1 capsule (300 mg total) by mouth in the morning, at noon, and at bedtime. 08/09/21 09/08/21  Mesner, Corene Cornea, MD  ?QUEtiapine (SEROQUEL) 100 MG tablet Take 100 mg by mouth at bedtime as needed (anxiety).  [provider]  ?traZODone (DESYREL) 150 MG tablet Take 1 tablet (150 mg total) by mouth at bedtime. ?Patient taking differently: Take 300 mg by mouth at bedtime. 08/09/21   Mesner, Corene Cornea, MD  ? ? ?Physical Exam: ?Vitals:  ? 08/24/21 1741 08/24/21 1742 08/24/21 1800 08/24/21 1806  ?BP:  (!) 190/104 (!) 164/97   ?Pulse:      ?Resp: (!) 21 (!) 22 (!) 23 18  ?Temp:       ?TempSrc:      ?SpO2:      ?Weight:      ?Height:      ? ? ? ? ?Constitutional: Young male, moderately built and nourished lying propped up in bed, somewhat ill looking, somnolent but easily arousable and answers questions appropriately but questions have to be repeated. ?Eyes: PERTLA, lids and conjunctivae normal ?ENMT: Mucous membranes are dry. Posterior pharynx clear of any exudate or lesions. Normal dentition.  ?Neck: supple, no masses, no thyromegaly ?Respiratory: Clear to auscultation without wheezing, rhonchi or crackles. No increased work of breathing.  Bilateral nipples with metal stud piercing. ?Cardiovascular: S1 & S2 heard, regular tachycardia.  No JVD, murmurs, rubs or clicks. No pedal edema.  Telemetry with sinus tachycardia in the 100s. ?Abdomen: Non distended. Non tender. Soft. No organomegaly or masses appreciated. No clinical Ascites. Normal bowel sounds heard. ?Musculoskeletal: no clubbing / cyanosis. No joint deformity upper and lower extremities. Good ROM, no contractures. Normal muscle tone.  No evidence of external injury or tenderness appreciated after patient was turned to both sides to examine along with his nurse. ?Skin: no rashes, lesions, ulcers. No induration.  Dry and flaky skin, especially on face and scalp.  Patient reports that he has eczema. ?Neurologic: CN 2-12 grossly intact. Sensation intact, DTR normal. Strength 5/5 in all 4 limbs.  ?Psychiatric: Impaired judgment and insight.  Lethargic but arousable and oriented x 3. Normal mood.  ? ? ? ?Labs on Admission: I have personally reviewed following labs and imaging studies ? ?CBC: ?Recent Labs  ?Lab 08/18/21 ?0243 08/20/21 ?1450 08/24/21 ?[id number] 08/24/21 ?0841  ?WBC 5.1 5.8 10.9*  --   ?NEUTROABS  --  4.0 9.6*  --   ?HGB 10.9* 10.8* 10.7* 12.2*  ?HCT 31.7* 32.1* 30.8* 36.0*  ?MCV 80.1 82.5 78.4*  --   ?PLT 363 325 262  --   ? ? ?Basic Metabolic Panel: ?Recent Labs  ?Lab 08/18/21 ?0243 08/18/21 ?1646 08/19/21 ?0433 08/20/21 ?1450  08/24/21 ?[id number] 08/24/21 ?[id number] 08/24/21 ?1454  ?NA 136  --  135 137 128* 129* 137  ?K 3.3*  --  4.6 4.4 3.8 3.8 3.4*  ?CL 100  --  100 103 90*  --  99  ?CO2 27  --  27 27 25   --  28  ?GLUCOSE 187* 412* 278* 332* 715*  --  156*  ?BUN 10  --  14 16 23*  --  19  ?CREATININE 0.94  --  1.26* 1.53* 2.16*  --  1.72*  ?CALCIUM 9.1  --  8.8* 9.0 9.3  --  9.1  ? ? ?Liver Function Tests: ?Recent Labs  ?Lab 08/20/21 ?1450 08/24/21 ?0834  ?AST 16 21  ?ALT 8 12  ?ALKPHOS 113 150*  ?BILITOT 0.1* 0.6  ?PROT 8.0 9.3*  ?ALBUMIN 2.9* 3.2*  ? ? ?Urine analysis: ?   ?Component Value Date/Time  ? [place] YELLOW 08/24/2021 0845  ? APPEARANCEUR CLEAR 08/24/2021 0845  ? LABSPEC 1.010 08/24/2021 0845  ? PHURINE 6.5 08/24/2021 0845  ?  GLUCOSEU >=500 (A) 08/24/2021 0845  ? HGBUR TRACE (A) 08/24/2021 0845  ? [place] NEGATIVE 08/24/2021 0845  ? [place] NEGATIVE 08/24/2021 0845  ? [place] NEGATIVE 08/24/2021 0845  ? NITRITE NEGATIVE 08/24/2021 0845  ? LEUKOCYTESUR NEGATIVE 08/24/2021 0845  ? ? ? ?Radiological Exams on Admission: ?US RENAL ? ?Result Date: 08/24/2021 ?CLINICAL DATA:  Acute kidney injury. Evaluate right renal cyst seen on lumbar spine MRI. EXAM: RENAL / URINARY TRACT ULTRASOUND COMPLETE COMPARISON:  Lumbar spine MRI 08/18/2021 FINDINGS: Right Kidney: Renal measurements: 11.3 x 5.3 x 6.0 cm. = volume: 188 mL. Right kidney is not well visualized. There is a poorly defined hypoechoic structure in the right kidney that measures 2.4 x 2.1 x 2.4 cm and not compatible with a simple cyst. Renal pelvis is likely prominent. No definite caliceal dilatation. Left Kidney: Renal measurements: 9.9 x 6.2 x 4.4 cm = volume: 139 mL. Left kidney is poorly characterized and obscured by bowel gas. No definite hydronephrosis. Bladder: Appears normal for degree of bladder distention. Other: None. IMPRESSION: 1. Limited examination of both kidneys. 2. Poorly defined hypoechoic lesion/structure in the right kidney that may correspond with the  complex lesion or cyst seen on the previous MRI. Structure is indeterminate based on ultrasound. This could represent an inflammatory or infectious etiology. Neoplastic process cannot be completely excluded

## 2021-08-24 NOTE — Progress Notes (Signed)
RNCM received inbound call from RN Casimiro Needle from [facility]. Patient currently in [facility] ED being transferred to room#1238. Per Casimiro Needle RN patient came with BS 700's, has decreased to 301, no ketones in urine. Patient needs appropriate Diabetes education, unsure if patient has DM supplies. Patient previously seen and left AMA, however has been instructed to remain for treatment.    ?Patient needs Diabetes educator referral prior to discharge.   ?

## 2021-08-24 NOTE — ED Notes (Signed)
Diabetic Nurse Coordinator paged ?

## 2021-08-24 NOTE — Assessment & Plan Note (Deleted)
Resolved

## 2021-08-24 NOTE — Assessment & Plan Note (Addendum)
Recent Labs  ?  08/24/21 ?1454 08/24/21 ?1919 08/24/21 ?2240 08/25/21 ?0304 08/25/21 ?1478 08/26/21 ?0300 08/27/21 ?2956 08/28/21 ?2130 08/29/21 ?8657 08/30/21 ?0434  ?BUN 19 16 15 14 14 15 14 17 18 19   ?CREATININE 1.72* 1.47* 1.48* 1.45* 1.59* 1.74* 1.72* 1.69* 1.60* 1.46*  ?Creatinine relatively stable.  AKI ruled out. ?-Continue monitoring off IV fluid. ?-Avoid nephrotoxic meds ? ?

## 2021-08-24 NOTE — Assessment & Plan Note (Deleted)
Low-grade fever to 100.6 ?F.  No further fever.  Empirically started on IV CTX and azithromycin for possible pneumonia. He has mild leukocytosis with lactic acidosis as well. ?-Continue ceftriaxone and azithromycin pending CTA chest ?-CTA chest as above ?

## 2021-08-24 NOTE — Assessment & Plan Note (Addendum)
Ongoing since January 2023.  Unclear etiology but patient is at risk for falls due to diabetic neuropathy and polypharmacy.  Multiple imaging including MRI brain, C, T and L spines, and CT head and cervical spine without significant finding to explain his symptoms.  Neuro exam nonfocal. ?-PT/OT-recommended SNF. ?-Fall precaution ?

## 2021-08-24 NOTE — ED Triage Notes (Signed)
States his sugar is high , ran out of strips and states he has fallen x 2 today  has been dizzy rt leg pain after falling ?

## 2021-08-24 NOTE — ED Notes (Signed)
ED Provider at bedside. 

## 2021-08-24 NOTE — Assessment & Plan Note (Deleted)
Secondary to ambulatory dysfunction. ?Please see discussion under ambulatory dysfunction. ?PT and OT evaluation. ?

## 2021-08-24 NOTE — Progress Notes (Signed)
Patient continues to state that chest pain is 8/10. MD Hongalgi notified. Patient is lethargic and continues to fall asleep while talking to RN. MD Hongalgi notified. ?

## 2021-08-24 NOTE — Progress Notes (Signed)
? ? ?  OVERNIGHT PROGRESS REPORT ? ?Update:  ?Visited patient at bedside. ?Patient is awake and oriented x4 at this time. ? ?Patient has no complaints of chest pain and states that he has not required the use of any nitroglycerin or pain medications for chest pain. ? ?Patient expresses no difficulty breathing or increase in oxygen requirement. ? ? ? ?Chinita Greenland MSNA MSN ACNPC-AG ?Acute Care Nurse Practitioner ?[facility] ?[place] ? ? ? ?

## 2021-08-24 NOTE — ED Notes (Signed)
Patient transported to X-ray 

## 2021-08-24 NOTE — ED Provider Notes (Signed)
?[facility] EMERGENCY DEPARTMENT ?Provider Note ? ? ?CSN: [id number] ?Arrival date & time: 08/24/21  0801 ? ?  ? ?History ? ?Chief Complaint  ?Patient presents with  ? Hyperglycemia  ? ? ?Victor SanesLarry Matthews is a 36 y.o. male. ? ?HPI ? ?[age] year old male with documented type 2 insulin-dependent diabetes (although patient says he is not sure), neuropathy with chronic pain presents emergency department with concern for hyperglycemia.  Patient states that he ran out of his test strips and his blood sugars been running high.  He states that he is been compliant with his insulin, last dose at this morning.  He was recently admitted for hyperglycemia and left AGAINST MEDICAL ADVICE 3 days ago for unclear reasons.  Patient states that he had a fall last night down onto his right hip.  He is complaining of right lower extremity pain.  Did not hit his head, no loss of consciousness.  Currently complaining of feeling dehydrated and like his blood sugar is high.  No recent fever or illness. ? ?Home Medications ?Prior to Admission medications   ?Medication Sig Start Date End Date Taking? Authorizing Provider  ?FLUoxetine (PROZAC) 20 MG capsule Take 20 mg by mouth daily as needed (anxiety attacks).    [provider]  ?insulin aspart protamine- aspart (NOVOLOG MIX 70/30) (70-30) 100 UNIT/ML injection Inject 0.4 mLs (40 Units total) into the skin with breakfast, with lunch, and with evening meal. ?Patient not taking: Reported on 08/18/2021 08/09/21   Mesner, Barbara CowerJason, MD  ?insulin detemir (LEVEMIR) 100 UNIT/ML injection Inject 0.3 mLs (30 Units total) into the skin daily. ?Patient taking differently: Inject 40 Units into the skin at bedtime. 08/09/21   Mesner, Barbara CowerJason, MD  ?insulin lispro protamine-lispro (HUMALOG 75/25 MIX) (75-25) 100 UNIT/ML SUSP injection Inject 35 Units into the skin 3 (three) times daily with meals.    [provider]  ?pregabalin (LYRICA) 300 MG capsule Take 1 capsule (300 mg total) by mouth in  the morning, at noon, and at bedtime. 08/09/21 09/08/21  Mesner, Barbara CowerJason, MD  ?QUEtiapine (SEROQUEL) 100 MG tablet Take 100 mg by mouth at bedtime as needed (anxiety).    [provider]  ?traZODone (DESYREL) 150 MG tablet Take 1 tablet (150 mg total) by mouth at bedtime. ?Patient taking differently: Take 300 mg by mouth at bedtime. 08/09/21   Mesner, Barbara CowerJason, MD  ?   ? ?Allergies    ?Patient has no known allergies.   ? ?Review of Systems   ?Review of Systems  ?Constitutional:  Positive for fatigue. Negative for fever.  ?Respiratory:  Negative for shortness of breath.   ?Cardiovascular:  Negative for chest pain.  ?Gastrointestinal:  Positive for nausea. Negative for abdominal pain, diarrhea and vomiting.  ?Musculoskeletal:  Negative for back pain and neck pain.  ?     Right hip pain  ?Skin:  Negative for rash.  ?Neurological:  Negative for headaches.  ? ?Physical Exam ?Updated Vital Signs ?BP (!) 156/103   Pulse (!) 109   Temp 99.7 ?F (37.6 ?C)   Resp 16   Ht 6' (1.829 m)   Wt 98.4 kg   SpO2 97%   BMI 29.42 kg/m?  ?Physical Exam ?Vitals and nursing note reviewed.  ?Constitutional:   ?   Appearance: Normal appearance. He is ill-appearing.  ?HENT:  ?   Head: Normocephalic.  ?   Mouth/Throat:  ?   Mouth: Mucous membranes are moist.  ?Eyes:  ?   Pupils: Pupils are equal, round, and  reactive to light.  ?Cardiovascular:  ?   Rate and Rhythm: Tachycardia present.  ?Pulmonary:  ?   Effort: Pulmonary effort is normal. No respiratory distress.  ?Abdominal:  ?   Palpations: Abdomen is soft.  ?   Tenderness: There is no abdominal tenderness.  ?Musculoskeletal:  ?   Comments: TTP of right hip without deformity. RLE neurovasc in tact  ?Skin: ?   General: Skin is warm.  ?Neurological:  ?   Mental Status: He is alert and oriented to person, place, and time. Mental status is at baseline.  ?Psychiatric:     ?   Mood and Affect: Mood normal.  ? ? ?ED Results / Procedures / Treatments   ?Labs ?(all labs ordered are listed, but  only abnormal results are displayed) ?Labs Reviewed  ?CBC WITH DIFFERENTIAL/PLATELET - Abnormal; Notable for the following components:  ?    Result Value  ? WBC 10.9 (*)   ? RBC 3.93 (*)   ? Hemoglobin 10.7 (*)   ? HCT 30.8 (*)   ? MCV 78.4 (*)   ? Neutro Abs 9.6 (*)   ? Lymphs Abs 0.6 (*)   ? All other components within normal limits  ?COMPREHENSIVE METABOLIC PANEL - Abnormal; Notable for the following components:  ? Sodium 128 (*)   ? Chloride 90 (*)   ? Glucose, Bld 715 (*)   ? BUN 23 (*)   ? Creatinine, Ser 2.16 (*)   ? Total Protein 9.3 (*)   ? Albumin 3.2 (*)   ? Alkaline Phosphatase 150 (*)   ? GFR, Estimated 40 (*)   ? All other components within normal limits  ?URINALYSIS, ROUTINE W REFLEX MICROSCOPIC - Abnormal; Notable for the following components:  ? Glucose, UA >=500 (*)   ? Hgb urine dipstick TRACE (*)   ? All other components within normal limits  ?URINALYSIS, MICROSCOPIC (REFLEX) - Abnormal; Notable for the following components:  ? Bacteria, UA RARE (*)   ? All other components within normal limits  ?CBG MONITORING, ED - Abnormal; Notable for the following components:  ? Glucose-Capillary >600 (*)   ? All other components within normal limits  ?I-STAT VENOUS BLOOD GAS, ED - Abnormal; Notable for the following components:  ? pO2, Ven 17 (*)   ? Sodium 129 (*)   ? HCT 36.0 (*)   ? Hemoglobin 12.2 (*)   ? All other components within normal limits  ?LIPASE, BLOOD  ?BETA-HYDROXYBUTYRIC ACID  ?OSMOLALITY  ?CBG MONITORING, ED  ?CBG MONITORING, ED  ? ? ?EKG ?None ? ?Radiology ?No results found. ? ?Procedures ?Marland KitchenCritical Care ?Performed by: Rozelle Logan, DO ?Authorized by: Rozelle Logan, DO  ? ?Critical care provider statement:  ?  Critical care time (minutes):  60 ?  Critical care time was exclusive of:  Separately billable procedures and treating other patients ?  Critical care was necessary to treat or prevent imminent or life-threatening deterioration of the following conditions:  Endocrine  crisis ?  Critical care was time spent personally by me on the following activities:  Development of treatment plan with patient or surrogate, discussions with consultants, evaluation of patient's response to treatment, examination of patient, ordering and review of laboratory studies, ordering and review of radiographic studies, ordering and performing treatments and interventions, pulse oximetry, re-evaluation of patient's condition and review of old charts ?  I assumed direction of critical care for this patient from another provider in my specialty: no   ?  Care  discussed with: admitting provider    ? ? ?Medications Ordered in ED ?Medications  ?lactated ringers bolus 1,968 mL (has no administration in time range)  ?insulin regular, human (MYXREDLIN) 100 units/ 100 mL infusion (has no administration in time range)  ?lactated ringers infusion (has no administration in time range)  ?dextrose 5 % in lactated ringers infusion (has no administration in time range)  ?dextrose 50 % solution 0-50 mL (has no administration in time range)  ?potassium chloride 10 mEq in 100 mL IVPB (10 mEq Intravenous New Bag/Given 08/24/21 0919)  ?sodium chloride 0.9 % bolus 1,000 mL (1,000 mLs Intravenous New Bag/Given 08/24/21 0849)  ?morphine (PF) 4 MG/ML injection 4 mg (4 mg Intravenous Given 08/24/21 0859)  ? ? ?ED Course/ Medical Decision Making/ A&P ?  ?                        ?Medical Decision Making ?Amount and/or Complexity of Data Reviewed ?Labs: ordered. ?Radiology: ordered. ?ECG/medicine tests: ordered. ? ?Risk ?Prescription drug management. ?Decision regarding hospitalization. ? ? ?This patient presents to the ED for concern of elevated blood sugar, this involves an extensive number of treatment options, and is a complaint that carries with it a high risk of complications and morbidity.  The differential diagnosis includes DKA, HHS, dehydration, noncompliance ? ? ?Additional history obtained: ?-External records from outside  source obtained and reviewed including: Chart review including previous notes, labs, imaging, consultation notes ? ? ?Lab Tests: ?-I ordered, reviewed, and interpreted labs.  The pertinent results include: Hyperglyc

## 2021-08-24 NOTE — Progress Notes (Signed)
Patient came into this RNs care at approximately 1500. As RN did initial assessment, patient stated that he had right leg pain 10/10. MD notified. MD to bedside. MD asked patient if patient had pain anywhere else. Patient denied. MD Hongalgi asked if patient had chest pain. Patient denied. At 1711 patient called RN to bedside. Ardeen Fillers, RN to bedside. Patient stated that he had right chest pain that had been present "since he got here." Hope Cochran,RN notified this RN. BP at 1716 196/100. MD Hongalgi notified. See EKG (Sinus Tach) for details. See new orders.  ?

## 2021-08-24 NOTE — ED Notes (Signed)
Phone Handoff Report provided to [facility] Transport Team 

## 2021-08-24 NOTE — Assessment & Plan Note (Addendum)
On  MS Contin, Percocet and Lyrica per narcotic database.  Last fill on 07/12/2021.  Reportedly could not fill last month due to illness and hospitalization. ?-Resume home Lyrica at reduced dose ?-Replaced home MS Contin with OxyContin  ?-Percocet 5/325 every 6 hours as needed ?-Continue Tylenol for mild pain ?-Tramadol as needed for moderate pain ?

## 2021-08-24 NOTE — Assessment & Plan Note (Addendum)
CTA C/A/P showed right renal and perinephric abscess with pneumoperitoneum, moderate right pleural effusion and possible septic emboli to RUL.  Of note, patient had an MRI of L-spine on 4/6 that showed heterogeneous, partially cystic lesion of right kidney.  He was also hospitalized and treated for E. coli bacteremia 2 months ago.   Blood cultures NGTD but obtained after IV ceftriaxone.  MRSA PCR screen negative.  Spiked fever to 101.8 on 4/15. No leukocytosis ?-S/p IR drain placement. Fluid culture with ESBL E. coli ?-IV CTX>>IV Zosyn>IV Invanz through 4/26 per ID.  Not a candidate for home infusion.  No p.o. option ?-Regardless, therapy recommended SNF.  He is on board. ?-Threatened to leave AMA on 4/17.  He has medical decision-making capacity ? ?

## 2021-08-24 NOTE — ED Notes (Signed)
Phone Report given to Consulting civil engineer at [facility]) ?

## 2021-08-24 NOTE — Progress Notes (Signed)
Plan of Care Note for accepted transfer ? ? ?Patient: Victor Matthews MRN: [id number]   DOA: 08/24/2021 ? ?Facility requesting transfer: [facility] ?Requesting Provider: Dr. Wilkie Aye ?Reason for transfer: HHS ?Facility course: 36 yo M presenting with dizziness and falls. W/u in ED showed he's in HHS. He was started on Endotool. Apparently he has had multiple admission for non-compliance on his meds. He left from service a week ago AMA. Coming to SDU at [facility] ? ?Plan of care: ?The patient is accepted for admission to [facility] unit, at [facility]..  ?Upon arrival to [facility], nursing staff will need to notify PATIENT PLACEMENT that the patient has arrived. The flow manager will contact [facility] to assign patient to the appropriate physician. Thank you.  ? ? ?Author: ?Teddy Spike, DO ?08/24/2021 ? ?Check [email or web address] for on-call coverage. ? ?Nursing staff, Please call [facility] Admits & Consults System-Wide number on Amion as soon as patient's arrival, so appropriate admitting provider can evaluate the pt. ? ?

## 2021-08-24 NOTE — Assessment & Plan Note (Addendum)
Likely from hyperglycemia.  Resolved. ?

## 2021-08-24 NOTE — ED Notes (Signed)
Presents with elevated blood glucose levels, brought to room 2, CBG ck,d read [facility], ED MD informed, placed on cont cardiac monitoring with ECG performed. Pt states he had fallen last night due to being dizzy, pain in RLE.  ?

## 2021-08-24 NOTE — Assessment & Plan Note (Addendum)
Serial troponin and EKG negative.  Likely due to renal abscess and pleural effusion. ?-Management as above ? ?

## 2021-08-24 NOTE — Plan of Care (Signed)
Discussed with patient plan of care for the evening, pain management and medications with insulin drip with some teach back displayed.  Obtained orders from MD since no complaints of chest pain but right flank pain. ? ?Problem: Education: ?Goal: Knowledge of General Education information will improve ?Description: Including pain rating scale, medication(s)/side effects and non-pharmacologic comfort measures ?Outcome: Progressing ?  ?

## 2021-08-25 ENCOUNTER — Inpatient Hospital Stay (HOSPITAL_COMMUNITY): Payer: Medicare Other

## 2021-08-25 DIAGNOSIS — R911 Solitary pulmonary nodule: Secondary | ICD-10-CM

## 2021-08-25 DIAGNOSIS — E860 Dehydration: Secondary | ICD-10-CM

## 2021-08-25 DIAGNOSIS — R7989 Other specified abnormal findings of blood chemistry: Secondary | ICD-10-CM | POA: Diagnosis not present

## 2021-08-25 DIAGNOSIS — R0789 Other chest pain: Secondary | ICD-10-CM

## 2021-08-25 DIAGNOSIS — R509 Fever, unspecified: Secondary | ICD-10-CM

## 2021-08-25 DIAGNOSIS — G9341 Metabolic encephalopathy: Secondary | ICD-10-CM

## 2021-08-25 DIAGNOSIS — A419 Sepsis, unspecified organism: Secondary | ICD-10-CM

## 2021-08-25 DIAGNOSIS — W19XXXA Unspecified fall, initial encounter: Secondary | ICD-10-CM

## 2021-08-25 DIAGNOSIS — E876 Hypokalemia: Secondary | ICD-10-CM

## 2021-08-25 DIAGNOSIS — E1065 Type 1 diabetes mellitus with hyperglycemia: Secondary | ICD-10-CM

## 2021-08-25 DIAGNOSIS — N281 Cyst of kidney, acquired: Secondary | ICD-10-CM

## 2021-08-25 DIAGNOSIS — R918 Other nonspecific abnormal finding of lung field: Secondary | ICD-10-CM

## 2021-08-25 DIAGNOSIS — J90 Pleural effusion, not elsewhere classified: Secondary | ICD-10-CM

## 2021-08-25 DIAGNOSIS — F39 Unspecified mood [affective] disorder: Secondary | ICD-10-CM

## 2021-08-25 LAB — BASIC METABOLIC PANEL
Anion gap: 9 (ref 5–15)
Anion gap: 8 (ref 5–15)
BUN: 14 mg/dL (ref 6–20)
BUN: 14 mg/dL (ref 6–20)
CO2: 24 mmol/L (ref 22–32)
CO2: 24 mmol/L (ref 22–32)
Calcium: 8.4 mg/dL — ABNORMAL LOW (ref 8.9–10.3)
Calcium: 8.3 mg/dL — ABNORMAL LOW (ref 8.9–10.3)
Chloride: 102 mmol/L (ref 98–111)
Chloride: 102 mmol/L (ref 98–111)
Creatinine, Ser: 1.45 mg/dL — ABNORMAL HIGH (ref 0.61–1.24)
Creatinine, Ser: 1.59 mg/dL — ABNORMAL HIGH (ref 0.61–1.24)
GFR, Estimated: >60 mL/min (ref >60)
GFR, Estimated: 58 mL/min — ABNORMAL LOW (ref >60)
Glucose, Bld: 196 mg/dL — ABNORMAL HIGH (ref 70–99)
Glucose, Bld: 184 mg/dL — ABNORMAL HIGH (ref 70–99)
Potassium: 3.7 mmol/L (ref 3.5–5.1)
Potassium: 3.7 mmol/L (ref 3.5–5.1)
Sodium: 135 mmol/L (ref 135–145)
Sodium: 134 mmol/L — ABNORMAL LOW (ref 135–145)

## 2021-08-25 LAB — GLUCOSE, CAPILLARY
Glucose-Capillary: 144 mg/dL — ABNORMAL HIGH (ref 70–99)
Glucose-Capillary: 160 mg/dL — ABNORMAL HIGH (ref 70–99)
Glucose-Capillary: 176 mg/dL — ABNORMAL HIGH (ref 70–99)
Glucose-Capillary: 184 mg/dL — ABNORMAL HIGH (ref 70–99)
Glucose-Capillary: 187 mg/dL — ABNORMAL HIGH (ref 70–99)
Glucose-Capillary: 195 mg/dL — ABNORMAL HIGH (ref 70–99)
Glucose-Capillary: 205 mg/dL — ABNORMAL HIGH (ref 70–99)
Glucose-Capillary: 206 mg/dL — ABNORMAL HIGH (ref 70–99)
Glucose-Capillary: 207 mg/dL — ABNORMAL HIGH (ref 70–99)
Glucose-Capillary: 207 mg/dL — ABNORMAL HIGH (ref 70–99)
Glucose-Capillary: 211 mg/dL — ABNORMAL HIGH (ref 70–99)
Glucose-Capillary: 218 mg/dL — ABNORMAL HIGH (ref 70–99)
Glucose-Capillary: 302 mg/dL — ABNORMAL HIGH (ref 70–99)

## 2021-08-25 LAB — LACTIC ACID, PLASMA
Lactic Acid, Venous: 1.5 mmol/L (ref 0.5–1.9)
Lactic Acid, Venous: 0.9 mmol/L (ref 0.5–1.9)

## 2021-08-25 LAB — MAGNESIUM: Magnesium: 1.8 mg/dL (ref 1.7–2.4)

## 2021-08-25 IMAGING — CT CT ANGIO CHEST
2 of 7 series · 17 of 46 positions shown · IV contrast (OMNIPAQUE)
Comparison: None.

CLINICAL DATA: 36-year-old male with generalized weakness, positive
D-dimer, concern for pulmonary embolism.

EXAM:
CT ANGIOGRAPHY CHEST WITH CONTRAST
TECHNIQUE: Multidetector CT imaging of the chest was performed using the
standard protocol during bolus administration of intravenous
contrast. Multiplanar CT image reconstructions and MIPs were
obtained to evaluate the vascular anatomy.

[Series 5: thins · axial · 0.75mm/px · z∈[-200,+66]mm · 15 of 304 slices shown]
[im 19/304  lung]
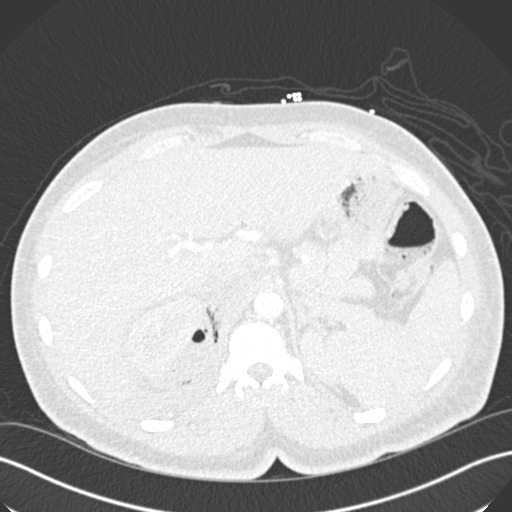
[im 38/304  soft-tissue]
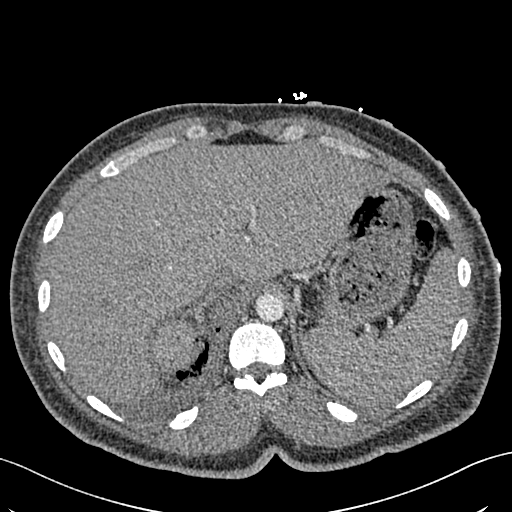
[im 57/304  lung]
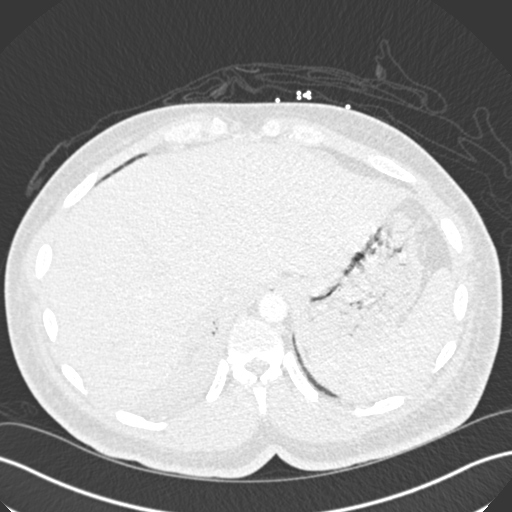
[im 76/304  soft-tissue]
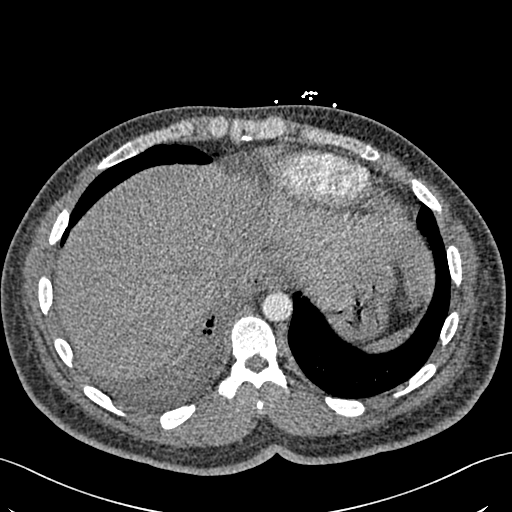
[im 95/304  lung]
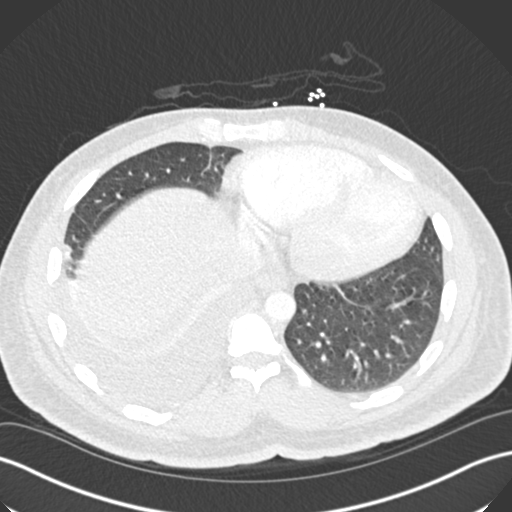
[im 114/304  soft-tissue]
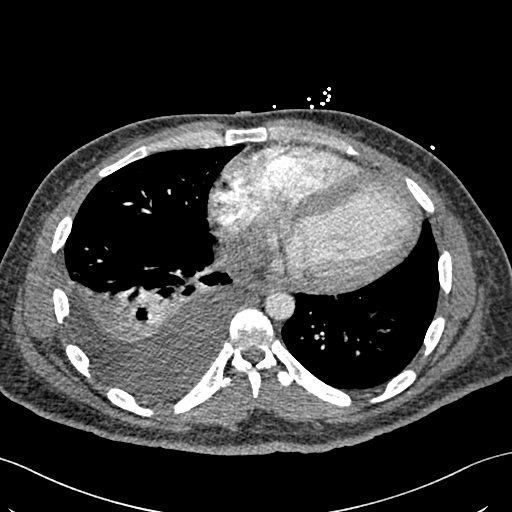
[im 133/304  lung]
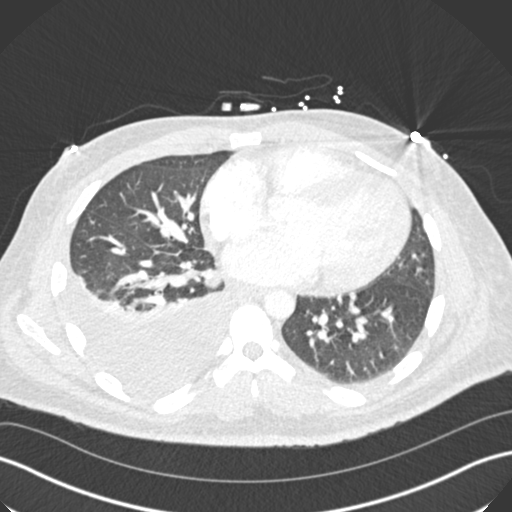
[im 152/304  soft-tissue]
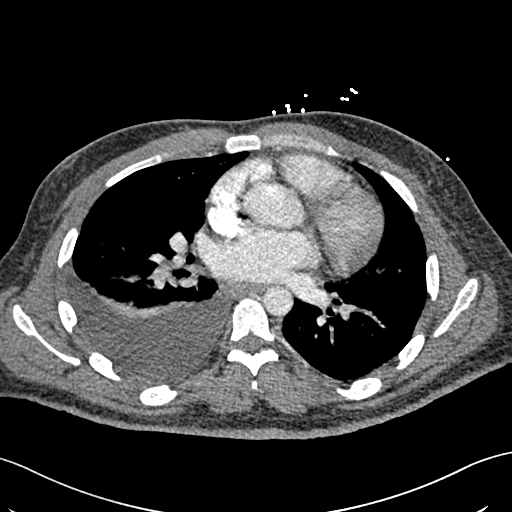
[im 171/304  lung]
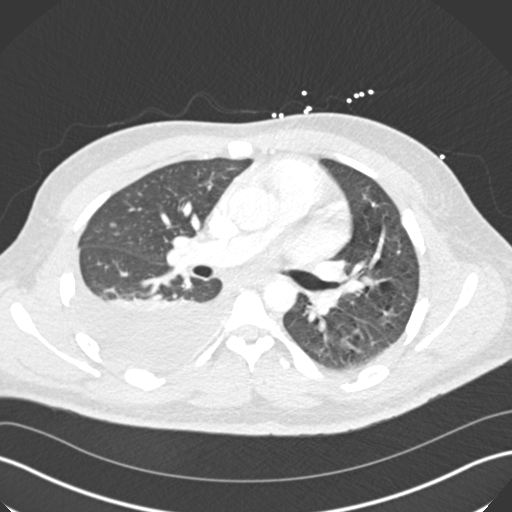
[im 190/304  soft-tissue]
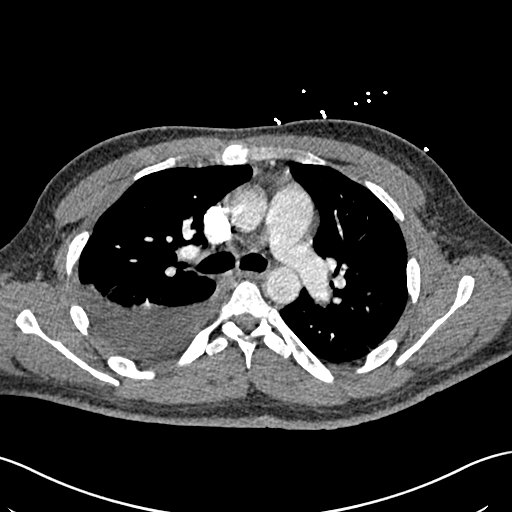
[im 209/304  lung]
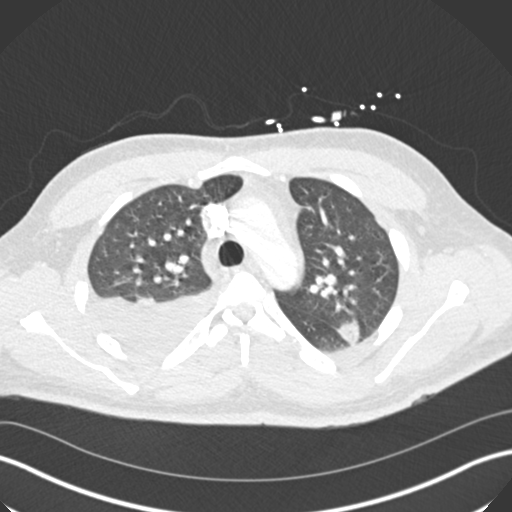
[im 228/304  soft-tissue]
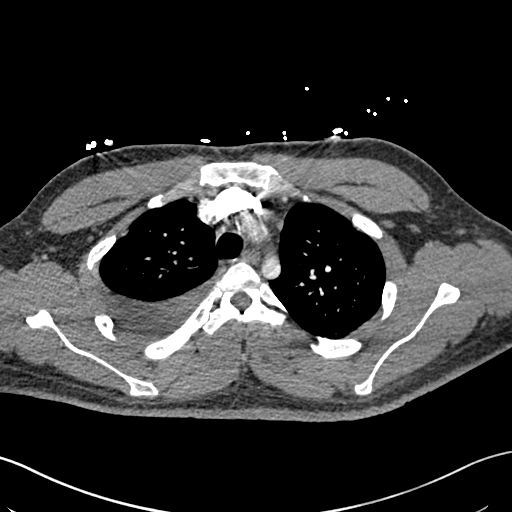
[im 247/304  lung]
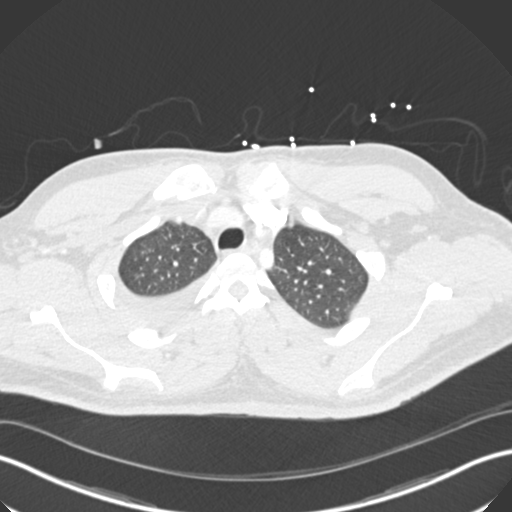
[im 266/304  soft-tissue]
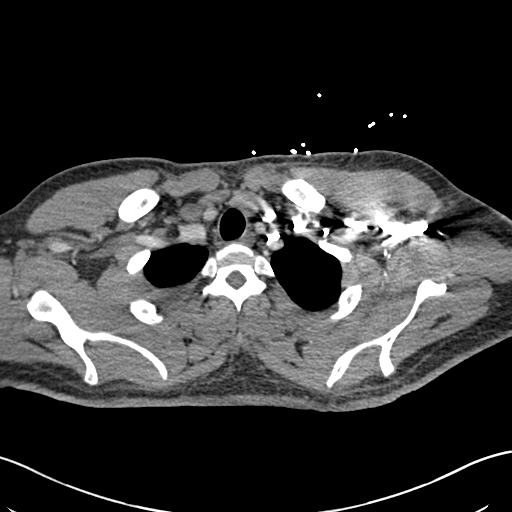
[im 285/304  lung]
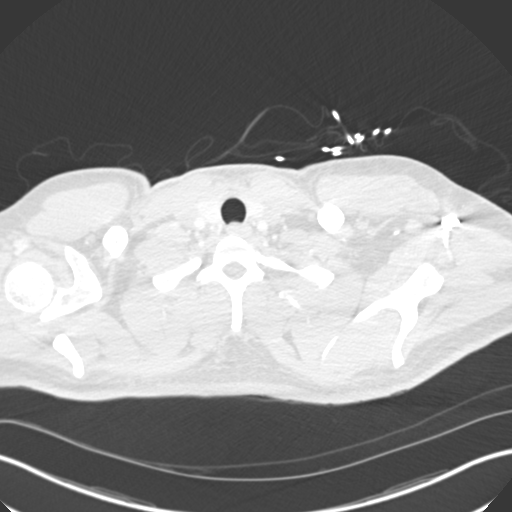

[Series 7: coronal mpr · coronal · 0.60mm/px · 2 of 104 slices shown]
[im 35/104  soft-tissue]
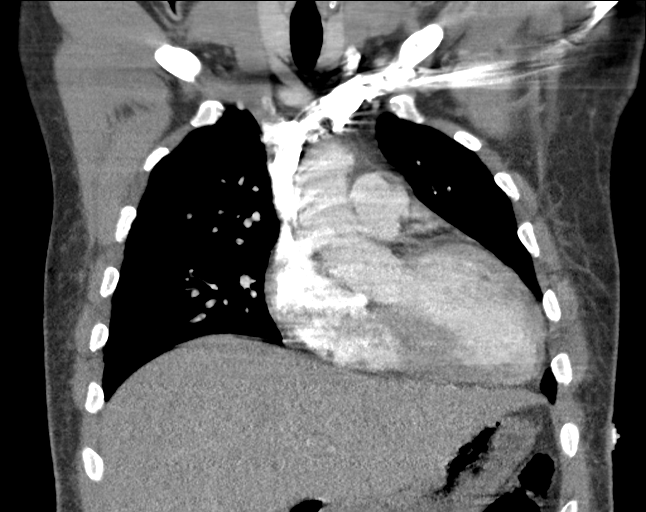
[im 69/104  soft-tissue]
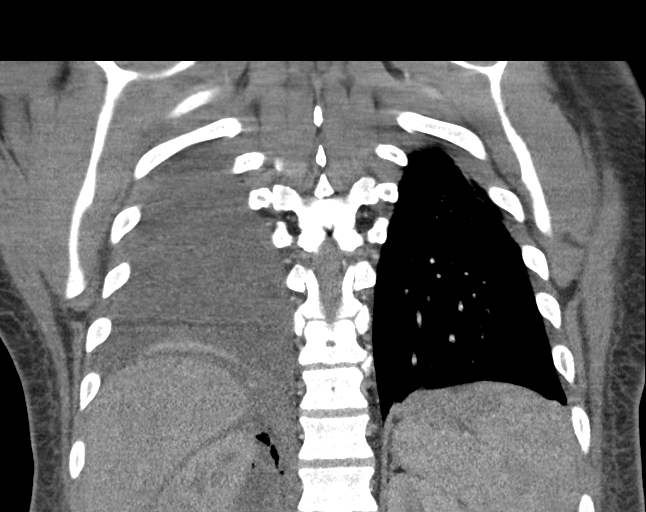

[17 of 46 positions shown; findings below may reference images not displayed]

RADIATION DOSE REDUCTION: This exam was performed according to the
departmental dose-optimization program which includes automated
exposure control, adjustment of the mA and/or kV according to
patient size and/or use of iterative reconstruction technique.

CONTRAST:  Eighty mL Omnipaque 350, intravenous
FINDINGS: Cardiovascular: Satisfactory opacification of the pulmonary arteries
to the segmental level. No evidence of pulmonary embolism. Moderate
global cardiomegaly. No pericardial effusion.

Mediastinum/Nodes: No enlarged mediastinal, hilar, or axillary lymph
nodes. Thyroid gland, trachea, and esophagus demonstrate no
significant findings.

Lungs/Pleura: Moderate right pleural effusion with associated right
basilar passive atelectasis. Rounded consolidative opacity in the
perifissural posterior left upper lobe measuring approximately 1.6 x
1.4 cm.

Upper Abdomen: Partially visualized right subcapsular versus
perinephric gas containing abscess. There are few foci of gas
tracking superiorly into the right suprarenal and periaortic spaces.
The remaining visualized upper abdomen is within normal limits.

Musculoskeletal: No chest wall abnormality. No acute or significant
osseous findings.

Review of the MIP images confirms the above findings.
IMPRESSION: Vascular:

1. No acute pulmonary embolism.
2. Moderate global cardiomegaly.

Non-Vascular:

1. Gas containing right perinephric abscess, partially visualized.
There is associated trace right pneumoretroperitoneum.
2. Moderate right pleural effusion with associated right basilar
passive atelectasis. This is likely reactive secondary to renal
abscess.
3. Rounded opacity in the left upper lobe measuring up to 1.6 cm,
favored represent septic emboli over neoplasm.

Recommend dedicated CT abdomen pelvis with contrast for further
characterization and Urology consultation.

These results were relayed by secure EMR chat at the time of
interpretation on [DATE] at [DATE] to provider MUIJEN .

## 2021-08-25 IMAGING — CT CT ABD-PELV W/O CM
2 of 4 series · 16 of 46 positions shown, 18 images · non-contrast
Comparison: Chest CT, same date.  Lumbar spine MRI [DATE]

CLINICAL DATA: Follow-up right renal abscess.



[Series 2: axial st · axial · 0.98mm/px · z∈[-639,-209]mm · 13 of 98 slices shown, 15 images]
[im 6/98  soft-tissue]
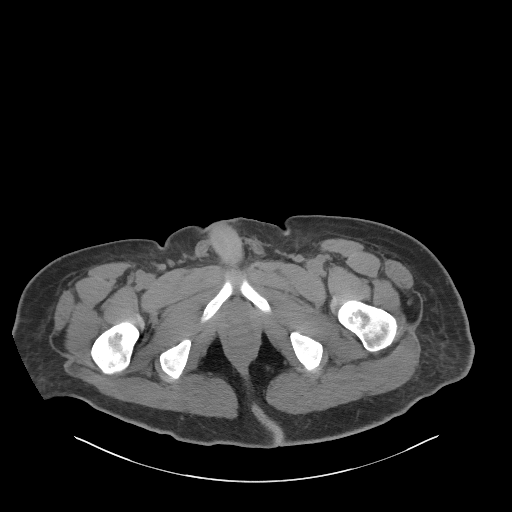
[im 6/98  bone]
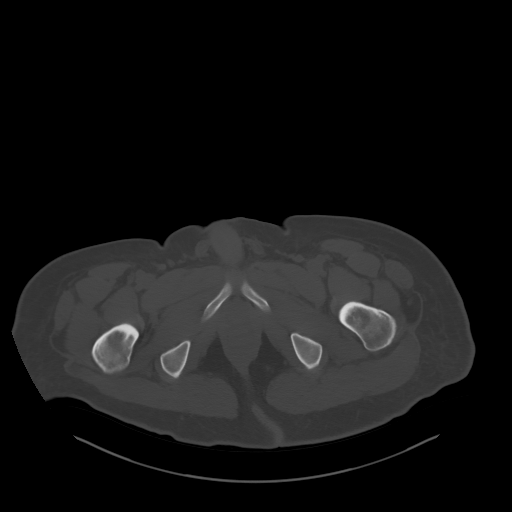
[im 12/98  soft-tissue]
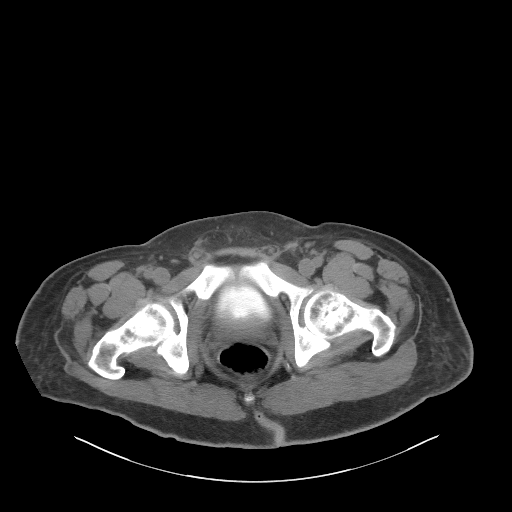
[im 23/98  soft-tissue]
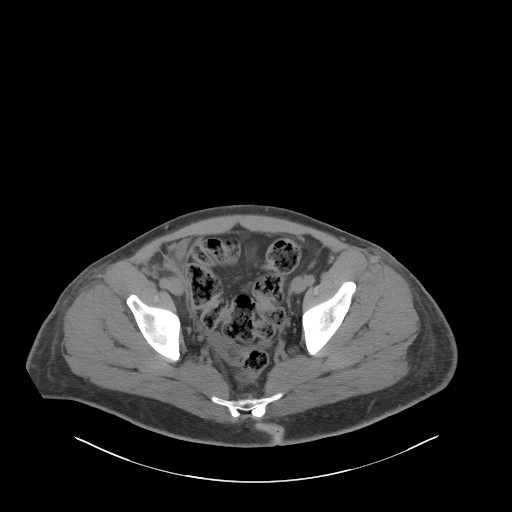
[im 29/98  soft-tissue]
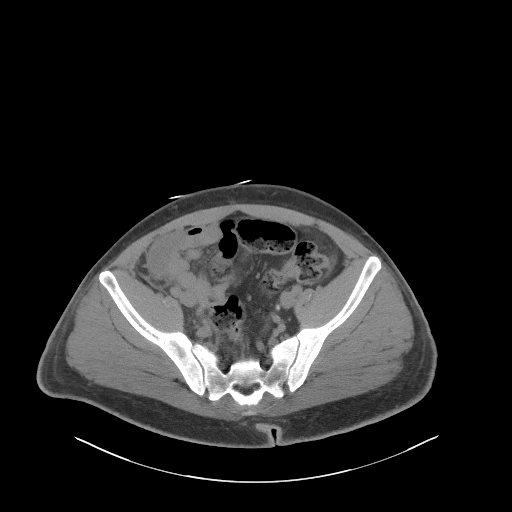
[im 35/98  soft-tissue]
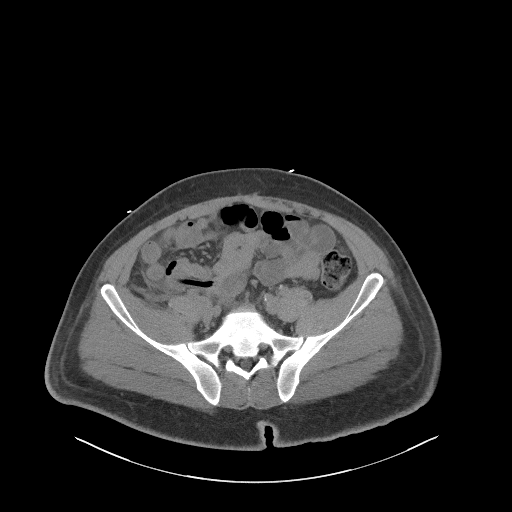
[im 40/98  soft-tissue]
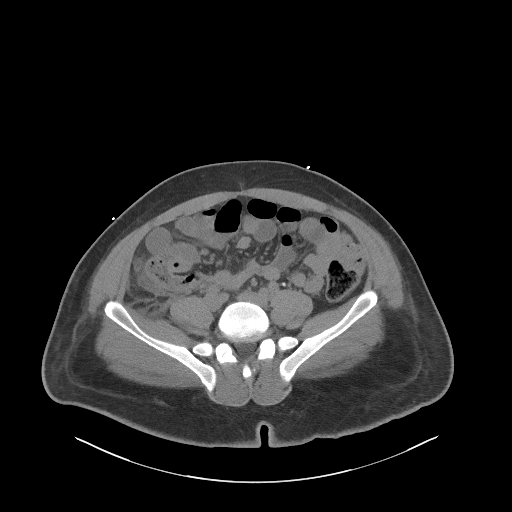
[im 52/98  soft-tissue]
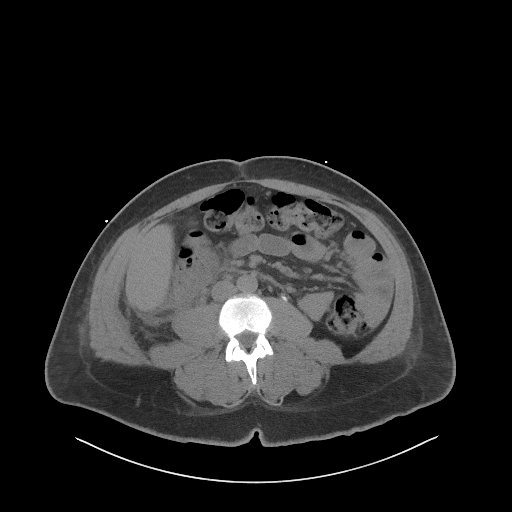
[im 58/98  soft-tissue]
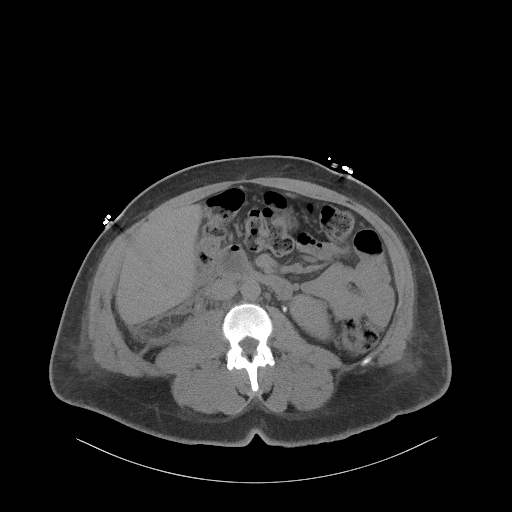
[im 63/98  soft-tissue]
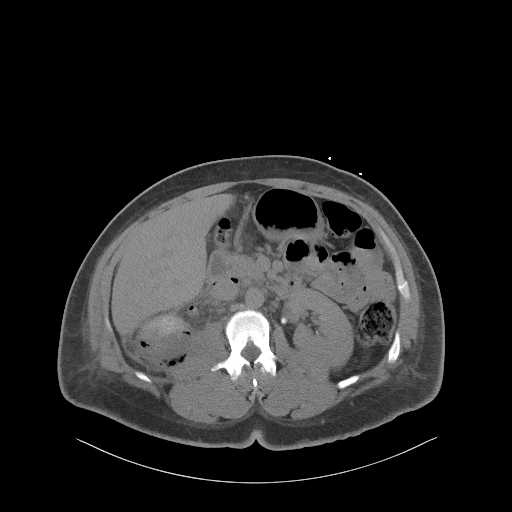
[im 63/98  bone]
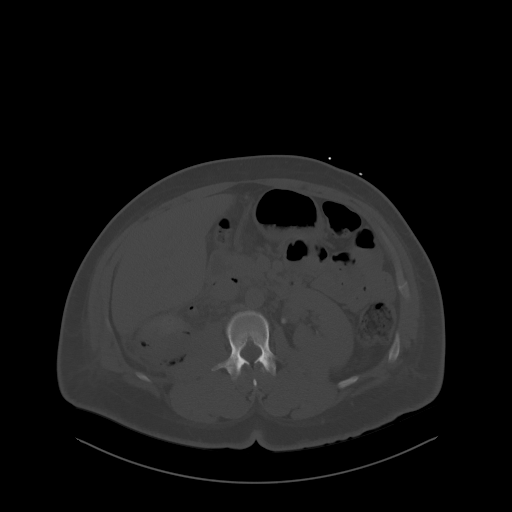
[im 69/98  soft-tissue]
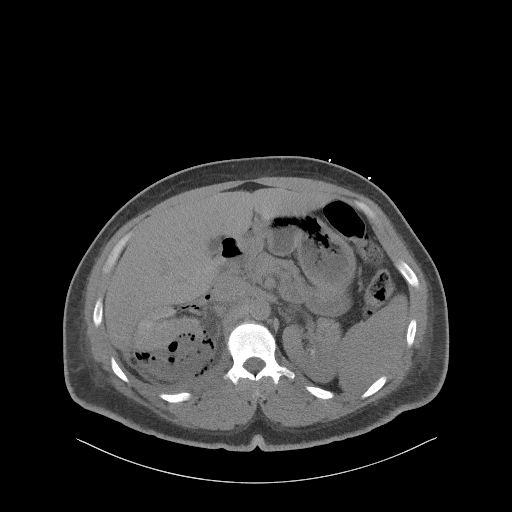
[im 75/98  soft-tissue]
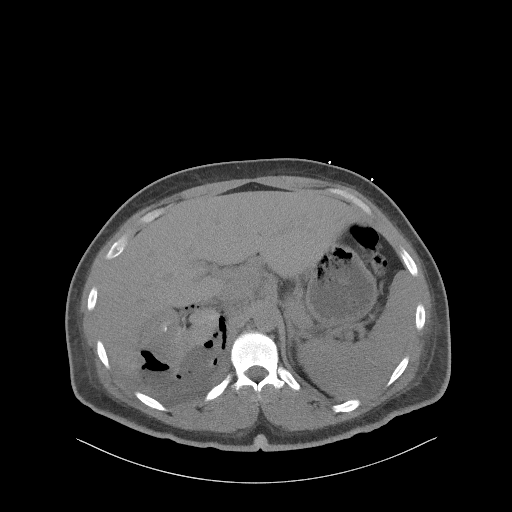
[im 86/98  soft-tissue]
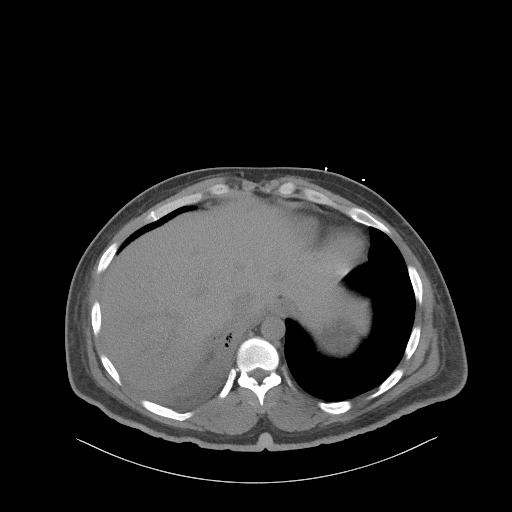
[im 92/98  soft-tissue]
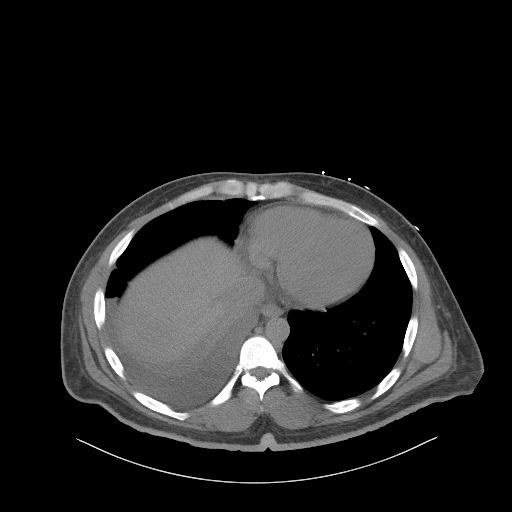

[Series 5: coronal st · coronal · 0.92mm/px · 3 of 119 slices shown]
[im 40/119  soft-tissue]
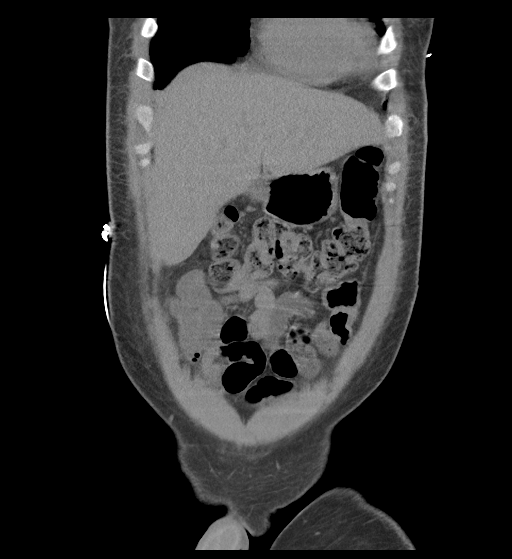
[im 53/119  soft-tissue]
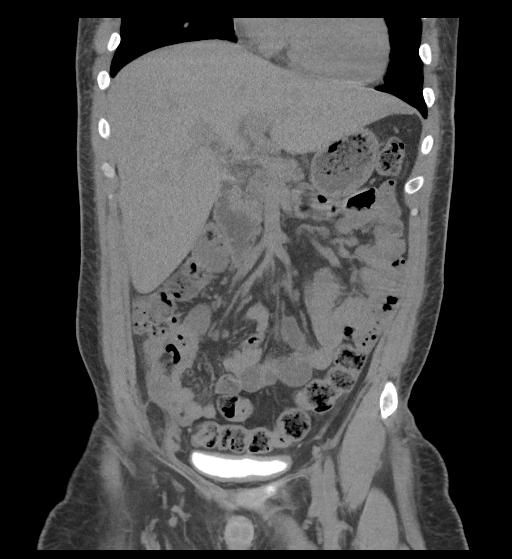
[im 66/119  soft-tissue]
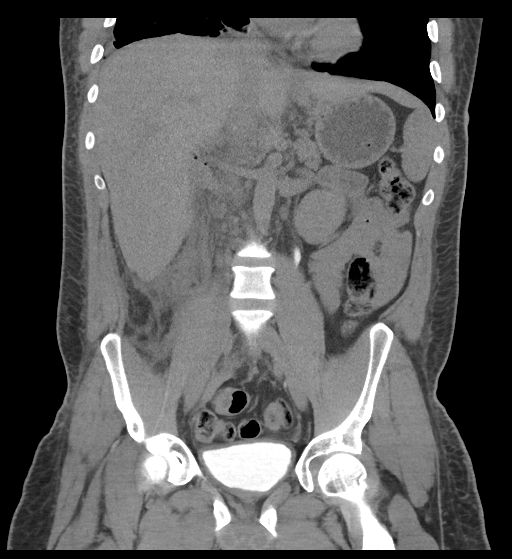

[16 of 46 positions shown; findings below may reference images not displayed]

FINDINGS: Lower chest: Moderate-sized right pleural effusion with overlying
atelectasis. The left lung is clear. The heart is normal in size.

Hepatobiliary: No hepatic lesions are identified. No intrahepatic
biliary dilatation. Normal caliber common bile duct.

Pancreas: No mass, inflammation or ductal dilatation.

Spleen: Normal size.  No focal lesions.

Adrenals/Urinary Tract: Adrenal glands are normal.

There is a large gas containing renal and perinephric abscess. This
involves the posterior renal parenchyma extending into the
perinephric space with there is fluid and gas also breaking out into
the retroperitoneum surrounding the IVC. It measures approximately
8.3 x 8.0 x 4.0 cm. There is a small amount of contrast in the renal
collecting systems from the prior contrast enhanced chest CT. The
left kidney is unremarkable. No bladder lesions are identified.

Stomach/Bowel: The stomach, duodenum, small bowel and colon are
grossly normal.

Vascular/Lymphatic: The aorta is normal in caliber. No
atheroscerlotic calcifications. No mesenteric of retroperitoneal
mass or adenopathy. Small scattered lymph nodes are noted.

Reproductive: The prostate gland and seminal vesicles are
unremarkable.

Other: Scattered free abdominal fluid mainly in the right pericolic
gutter and in the pelvis likely associated with the renal abscess.

Musculoskeletal: No significant bony findings.
IMPRESSION: 1. Large gas containing right renal and perinephric abscess
measuring approximately 8.3 x 8.0 x 4.0 cm.
2. Moderate-sized right pleural effusion with overlying atelectasis.
3. Scattered free abdominal fluid mainly in the right paracolic
gutter and in the pelvis likely associated with the renal abscess.

## 2021-08-25 MED ORDER — IOHEXOL 350 MG/ML SOLN
80.0000 mL | Freq: Once | INTRAVENOUS | Status: AC | PRN
  Administered 2021-08-25: 80 mL via INTRAVENOUS

## 2021-08-25 MED ORDER — PIPERACILLIN-TAZOBACTAM 3.375 G IVPB
3.3750 g | Freq: Three times a day (TID) | INTRAVENOUS | Status: DC
  Administered 2021-08-25 – 2021-08-29 (×12): 3.375 g via INTRAVENOUS
  Filled 2021-08-25 (×12): qty 50

## 2021-08-25 MED ORDER — INSULIN ASPART 100 UNIT/ML IJ SOLN
4.0000 [IU] | Freq: Three times a day (TID) | INTRAMUSCULAR | Status: DC
  Administered 2021-08-25 – 2021-08-28 (×6): 4 [IU] via SUBCUTANEOUS

## 2021-08-25 MED ORDER — POTASSIUM CHLORIDE IN NACL 20-0.9 MEQ/L-% IV SOLN
INTRAVENOUS | Status: DC
  Filled 2021-08-25 (×6): qty 1000

## 2021-08-25 MED ORDER — INSULIN DETEMIR 100 UNIT/ML ~~LOC~~ SOLN
25.0000 [IU] | Freq: Two times a day (BID) | SUBCUTANEOUS | Status: DC
  Administered 2021-08-25 – 2021-08-28 (×5): 25 [IU] via SUBCUTANEOUS
  Filled 2021-08-25 (×7): qty 0.25

## 2021-08-25 MED ORDER — FLUOXETINE HCL 20 MG PO CAPS
20.0000 mg | ORAL_CAPSULE | Freq: Every day | ORAL | Status: DC | PRN
  Administered 2021-08-26 – 2021-08-28 (×2): 20 mg via ORAL
  Filled 2021-08-25 (×2): qty 1

## 2021-08-25 MED ORDER — OXYCODONE-ACETAMINOPHEN 5-325 MG PO TABS
1.0000 | ORAL_TABLET | Freq: Three times a day (TID) | ORAL | Status: DC | PRN
  Administered 2021-08-25 – 2021-08-28 (×7): 1 via ORAL
  Filled 2021-08-25 (×7): qty 1

## 2021-08-25 MED ORDER — PREGABALIN 75 MG PO CAPS
150.0000 mg | ORAL_CAPSULE | Freq: Three times a day (TID) | ORAL | Status: DC
  Administered 2021-08-25 – 2021-09-02 (×25): 150 mg via ORAL
  Filled 2021-08-25 (×25): qty 2

## 2021-08-25 MED ORDER — INSULIN ASPART 100 UNIT/ML IJ SOLN
0.0000 [IU] | Freq: Every day | INTRAMUSCULAR | Status: DC
  Administered 2021-08-27: 2 [IU] via SUBCUTANEOUS
  Administered 2021-08-28: 3 [IU] via SUBCUTANEOUS
  Administered 2021-08-29: 2 [IU] via SUBCUTANEOUS

## 2021-08-25 MED ORDER — SODIUM CHLORIDE (PF) 0.9 % IJ SOLN
INTRAMUSCULAR | Status: AC
Start: 2021-08-25 — End: 2021-08-25
  Filled 2021-08-25: qty 50

## 2021-08-25 MED ORDER — TRAMADOL HCL 50 MG PO TABS
50.0000 mg | ORAL_TABLET | Freq: Four times a day (QID) | ORAL | Status: DC | PRN
  Administered 2021-08-25 – 2021-09-02 (×9): 50 mg via ORAL
  Filled 2021-08-25 (×10): qty 1

## 2021-08-25 MED ORDER — OXYCODONE-ACETAMINOPHEN 5-325 MG PO TABS
1.0000 | ORAL_TABLET | Freq: Once | ORAL | Status: AC | PRN
  Administered 2021-08-25: 1 via ORAL
  Filled 2021-08-25: qty 1

## 2021-08-25 MED ORDER — QUETIAPINE FUMARATE 50 MG PO TABS
50.0000 mg | ORAL_TABLET | Freq: Every day | ORAL | Status: DC
  Administered 2021-08-25 – 2021-08-26 (×2): 50 mg via ORAL
  Filled 2021-08-25 (×2): qty 1

## 2021-08-25 MED ORDER — INSULIN ASPART 100 UNIT/ML IJ SOLN: 0.0000 [IU] | Freq: Every day | INTRAMUSCULAR | Status: DC

## 2021-08-25 MED ORDER — TRAZODONE HCL 50 MG PO TABS
150.0000 mg | ORAL_TABLET | Freq: Every day | ORAL | Status: DC
  Administered 2021-08-25 – 2021-09-01 (×8): 150 mg via ORAL
  Filled 2021-08-25: qty 1
  Filled 2021-08-25: qty 3
  Filled 2021-08-25 (×3): qty 1
  Filled 2021-08-25: qty 3
  Filled 2021-08-25 (×2): qty 1

## 2021-08-25 MED ORDER — ACETAMINOPHEN 325 MG PO TABS
650.0000 mg | ORAL_TABLET | Freq: Four times a day (QID) | ORAL | Status: DC | PRN
  Administered 2021-08-27 – 2021-08-31 (×5): 650 mg via ORAL
  Filled 2021-08-25 (×5): qty 2

## 2021-08-25 MED ORDER — OXYCODONE HCL ER 15 MG PO T12A
15.0000 mg | EXTENDED_RELEASE_TABLET | Freq: Two times a day (BID) | ORAL | Status: DC
  Administered 2021-08-25 – 2021-09-02 (×16): 15 mg via ORAL
  Filled 2021-08-25 (×16): qty 1

## 2021-08-25 MED ORDER — INSULIN ASPART 100 UNIT/ML IJ SOLN
0.0000 [IU] | Freq: Three times a day (TID) | INTRAMUSCULAR | Status: DC
  Administered 2021-08-25 (×2): 5 [IU] via SUBCUTANEOUS
  Administered 2021-08-26: 2 [IU] via SUBCUTANEOUS
  Administered 2021-08-27 (×2): 5 [IU] via SUBCUTANEOUS
  Administered 2021-08-27: 11 [IU] via SUBCUTANEOUS
  Administered 2021-08-28: 5 [IU] via SUBCUTANEOUS
  Administered 2021-08-28: 8 [IU] via SUBCUTANEOUS
  Administered 2021-08-28: 3 [IU] via SUBCUTANEOUS
  Administered 2021-08-29: 5 [IU] via SUBCUTANEOUS
  Administered 2021-08-29: 8 [IU] via SUBCUTANEOUS
  Administered 2021-08-29 – 2021-08-30 (×2): 3 [IU] via SUBCUTANEOUS
  Administered 2021-08-30 – 2021-08-31 (×3): 2 [IU] via SUBCUTANEOUS
  Administered 2021-09-01: 3 [IU] via SUBCUTANEOUS
  Administered 2021-09-01 – 2021-09-02 (×4): 2 [IU] via SUBCUTANEOUS
  Administered 2021-09-02: 5 [IU] via SUBCUTANEOUS

## 2021-08-25 NOTE — Progress Notes (Addendum)
?PROGRESS NOTE ? ?Victor Matthews [id number] DOB: [birth year]  ? ?PCP: System, Provider Not In ? ?Patient is from: Home.  Recently moved from [place] to [place].  Lives with his godfather. ? ?DOA: 08/24/2021 LOS: 1 ? ?Chief complaints ?Chief Complaint  ?Patient presents with  ? Hyperglycemia  ?  ? ?Brief Narrative / Interim history: ?36 year old M with PMH of DM-1 with peripheral neuropathy, chronic pain, anxiety, BPD, E. coli bacteremia and COVID-19 infection in 06/2021, and recent hospitalization from 4/4-4/7 for hyperglycemia and ambulatory dysfunction when he left AMA returned to [facility] ED with hyperglycemia and falls x2, and admitted for hyperglycemia to 715, AKI, acute metabolic encephalopathy and uncontrolled hypertension.  He was started on insulin drip and IV fluid.  He also had elevated D-dimer in ED.   ? ?Subjective: ?Seen and examined earlier this morning.  No major events overnight of this morning.  Hyperglycemia improved.  He complains of right-sided abdominal pain.  Pain is worse with deep breathing.  He also complains of right leg pain.  Nothing on the left.  He denies chest pain, nausea, vomiting or UTI symptoms. ? ?Objective: ?Vitals:  ? 08/25/21 0900 08/25/21 1148 08/25/21 1452 08/25/21 1500  ?BP: 136/84  (!) 154/93   ?Pulse: 98   100  ?Resp: (!) 22   (!) 26  ?Temp:  100.2 ?F (37.9 ?C)    ?TempSrc:  Oral    ?SpO2: 98%   96%  ?Weight:      ?Height:      ? ? ?Examination: ? ?GENERAL: No apparent distress.  Nontoxic. ?HEENT: MMM.  Vision and hearing grossly intact.  ?NECK: Supple.  No apparent JVD.  ?RESP:  No IWOB.  Fair aeration bilaterally. ?CVS:  RRR. Heart sounds normal.  ?ABD/GI/GU: BS+. Abd soft.  Mild diffuse tenderness in right abdomen.  No rebound or guarding. ?MSK/EXT:  Moves extremities.  Diffuse tenderness in right leg. ?SKIN: Scaly skin lesion in his face  ?NEURO: Awake, alert and oriented appropriately.  No apparent focal neuro deficit. ?PSYCH: Calm. Normal affect.  ? ?Procedures:   ?None ? ?Microbiology summarized: ?MRSA PCR screen negative. ? ?Assessment and Plan: ?* Uncontrolled type 1 diabetes mellitus with hyperglycemia, with long-term current use of insulin ([place]) ?Suspect poor compliance contributing to this.  Does not meet criteria for HHS or DKA.  On Levemir 30 units daily and NovoLog 70/30 at 40 units 3 times daily at home?  A1c 14.6% ?Recent Labs  ?Lab 08/25/21 ?0735 08/25/21 ?[id number] 08/25/21 ?[id number] 08/25/21 ?1146 08/25/21 ?1226  ?GLUCAP 160* 144* 176* 206* 207*  ?-Transition to subcu insulin  ?-Start with Levemir 25 units twice daily, SSI-moderate and NovoLog 4 units 3 times daily ?-Need to adjust home insulin on discharge ?-Consult diabetic coordinator ? ? ?Sepsis due to right perinephric abscess ?MRI of L-spine 4/6 had shown heterogenous, partially cystic lesion of the right kidney.  Renal US was recommended at that time but patient left AMA.  Renal US on 4/11 concerning for complex lesion or cyst.  CTA chest obtained to rule out PE showed gas containing right perinephric abscess with associated trace right pneumoperitoneum.  ?-Urology consulted and recommended CT abdomen and pelvis without contrast ?-Ordered antibiotics to IV Zosyn ?-Check blood culture ? ?Possible RUL septic emboli ?From renal abscess? ?-Broaden antibiotic to IV Zosyn. ?-Check blood culture ? ?Pleural effusion on right ?Felt to be reactive secondary to renal abscess ?-May need IR drainage after urology evaluation for renal abscess. ? ?Elevated d-dimer ?LE Doppler negative for DVT.  CTA chest negative for PE but right perinephric abscess, possible septic emboli and right pleural effusion. ? ?Acute metabolic encephalopathy ?Resolved.  Some concern about polypharmacy. ?-Minimize sedating medications. ? ?Atypical chest pain ?Serial troponin and EKG negative.  Likely due to renal abscess and pleural effusion. ?-Management as above ? ? ?Fall due to ambulatory dysfunction ?Ongoing since January 2023.  Unclear etiology.   He has diabetic neuropathy.  Multiple imaging including MRI C, T and L spines, and CT head and cervical spine without significant finding to explain his symptoms.  He complains right leg pain and right-sided body pain likely from the fall he had.  Has some diffuse tenderness.  ?-PT/OT eval ?-Fall precaution ? ?Opacity of lung on imaging study ?MRI of T-spine 4/6 had reported focal opacity in the left midlung ?-Follow CTA chest ? ?Hypokalemia ?Resolved. ? ?Elevated serum creatinine ?Recent Labs  ?  08/17/21 ?1846 08/18/21 ?0243 08/19/21 ?[id number] 08/20/21 ?1450 08/24/21 ?[id number] 08/24/21 ?1454 08/24/21 ?1919 08/24/21 ?2240 08/25/21 ?0304 08/25/21 ?[id number]  ?BUN 11 10 14 16  23* 19 16 15 14 14   ?CREATININE 1.27* 0.94 1.26* 1.53* 2.16* 1.72* 1.47* 1.48* 1.45* 1.59*  ?Creatinine relatively stable.  AKI ruled out. ?-Continue IV fluid ?-Avoid nephrotoxic meds ? ? ?Dehydration ?Likely from hyperglycemia. ?-Continue IV fluids ? ?Chronic pain syndrome ?Since he was on Tylenol #3, MS Contin and Lyrica per narcotic database.  Had 30-day supply of MS Contin, Percocet and Lyrica on 2/27.  After that, he had 5-day supply of Tylenol #3 and 45-day supply of Lyrica from a different provider. ?-Resume home Lyrica at reduced dose ?-Replaced home MS Contin with OxyContin  ?-Percocet 5/325 at 3 times daily as needed for (reduced dose from home) ?-Continue Tylenol for mild pain ?-Tramadol as needed for moderate pain ? ?History of anxiety/bipolar disorder ?-Resume home Prozac and trazodone ?-Resume home Seroquel at reduced dose ? ? ? ?DVT prophylaxis:  ?enoxaparin (LOVENOX) injection 40 mg Start: 08/24/21 1800 ? ?Code Status: full code ?Family Communication: Patient and/or RN. Available if any question.  ?Level of care: Med-Surg ?Status is: Inpatient ?Remains inpatient appropriate because: Hyperglycemia and further evaluation to rule out PE and pneumonia ? ? ?Final disposition: TBD ?Consultants:  ?Urology ? ?Sch Meds:  ?Scheduled Meds: ?  Chlorhexidine Gluconate Cloth  6 each Topical Daily  ? diclofenac Sodium  4 g Topical QID  ? enoxaparin (LOVENOX) injection  40 mg Subcutaneous Q24H  ? insulin aspart  0-15 Units Subcutaneous TID WC  ? [START ON 08/26/2021] insulin aspart  0-5 Units Subcutaneous QHS  ? insulin aspart  4 Units Subcutaneous TID WC  ? insulin detemir  25 Units Subcutaneous BID  ? oxyCODONE  15 mg Oral Q12H  ? pregabalin  150 mg Oral TID  ? QUEtiapine  50 mg Oral QHS  ? traZODone  150 mg Oral QHS  ? ?Continuous Infusions: ? sodium chloride Stopped (08/25/21 0600)  ? 0.9 % NaCl with KCl 20 mEq / L 100 mL/hr at 08/25/21 1510  ? piperacillin-tazobactam (ZOSYN)  IV 12.5 mL/hr at 08/25/21 1510  ? ?PRN Meds:.sodium chloride, acetaminophen, dextrose, FLUoxetine, hydrALAZINE, labetalol, nitroGLYCERIN, oxyCODONE-acetaminophen, traMADol ? ?Antimicrobials: ?Anti-infectives (From admission, onward)  ? ? Start     Dose/Rate Route Frequency Ordered Stop  ? 08/25/21 1530  piperacillin-tazobactam (ZOSYN) IVPB 3.375 g       ? 3.375 g ?12.5 mL/hr over 240 Minutes Intravenous Every 8 hours 08/25/21 1431    ? 08/24/21 1915  azithromycin (ZITHROMAX) 500 mg in sodium chloride  0.9 % 250 mL IVPB  Status:  Discontinued       ? 500 mg ?250 mL/hr over 60 Minutes Intravenous Every 24 hours 08/24/21 1825 08/25/21 1428  ? 08/24/21 1900  cefTRIAXone (ROCEPHIN) 2 g in sodium chloride 0.9 % 100 mL IVPB  Status:  Discontinued       ? 2 g ?200 mL/hr over 30 Minutes Intravenous Daily 08/24/21 1825 08/25/21 1428  ? ?  ? ? ? ?I have personally reviewed the following labs and images: ?CBC: ?Recent Labs  ?Lab 08/20/21 ?1450 08/24/21 ?[id number] 08/24/21 ?0841  ?WBC 5.8 10.9*  --   ?NEUTROABS 4.0 9.6*  --   ?HGB 10.8* 10.7* 12.2*  ?HCT 32.1* 30.8* 36.0*  ?MCV 82.5 78.4*  --   ?PLT 325 262  --   ? ?BMP &GFR ?Recent Labs  ?Lab 08/24/21 ?1454 08/24/21 ?1919 08/24/21 ?2240 08/25/21 ?0304 08/25/21 ?[id number]  ?NA 137 133* 134* 135 134*  ?K 3.4* 3.3* 3.4* 3.7 3.7  ?CL 99 99 102 102 102  ?CO2 28  26 25 24 24   ?GLUCOSE 156* 247* 167* 196* 184*  ?BUN 19 16 15 14 14   ?CREATININE 1.72* 1.47* 1.48* 1.45* 1.59*  ?CALCIUM 9.1 8.6* 8.4* 8.4* 8.3*  ?MG  --   --   --  1.8  --   ? ?Estimated Creatinine Clearance: 7

## 2021-08-25 NOTE — Hospital Course (Addendum)
36 year old M with PMH of DM-1 with peripheral neuropathy, chronic pain on chronic opiate, tobacco use disorder, anxiety, BPD, E. coli bacteremia and COVID-19 infection in 06/2021, and recent hospitalization from 4/4-4/7 for hyperglycemia and ambulatory dysfunction when he left AMA returned to [facility] ED with hyperglycemia and falls x2, and admitted for hyperglycemia to 715, AKI, acute metabolic encephalopathy, possible pneumonia and uncontrolled hypertension.  He was started on insulin drip, IV ceftriaxone and IV fluid.  He also had elevated D-dimer in ED- s/p CTA chest -showed right renal and perinephric abscess, moderate right pleural effusion and possible septic emboli to RUL but negative for PE.  Antibiotics broadened to IV Zosyn.  Urology consulted.  CT abdomen and pelvis obtained confirmed renal abscess.  IR consulted, and drain placed with bloody output.  Abscess culture with ESBL E. Coli. ID recommended Invanz through 4/26 in house.  Patient had persistent moderate pleural effusion on the right with pleuritic chest pain underwent thoracentesis with 520 cc removal and feeling much improved after that.  PT OT seen and recommending skilled nursing facility, TOC following.  He has a place available today 4/20 and is being discharged to skilled nursing facility in medically stable condition ?

## 2021-08-25 NOTE — Assessment & Plan Note (Addendum)
Anxious and overwhelmed about prolonged hospitalization.  Repeatedly asking to go outside for fresh air but he is at risk of fall due to ambulatory dysfunction.  Threatening to leave AMA. ?-Continue home Prozac, trazodone and Seroquel. ?-Patient declined psychiatry consultation or help ?-Continue Klonopin 0.5 mg twice daily and Atarax.  Started in house. ?

## 2021-08-25 NOTE — TOC Initial Note (Signed)
Transition of Care (TOC) - Initial/Assessment Note  ? ? ?Patient Details  ?Name: Victor Matthews ?MRN: [id number] ?Date of Birth: 11-Jun-1985 ? ?Transition of Care (TOC) CM/SW Contact:    ?Tawanna Cooler, RN ?Phone Number: ?08/25/2021, 3:08 PM ? ?Clinical Narrative:                 ? ?Patient c/o pain, mobility problems.  PT eval pending.   ?TOC following for discharge needs.  ? ? ?Expected Discharge Plan: [place] ?Barriers to Discharge: Continued Medical Work up ? ? ?Expected Discharge Plan and Services ?Expected Discharge Plan: [place] ?  ?   ?Living arrangements for the past 2 months: [place] ?                ?  ?Prior Living Arrangements/Services ?Living arrangements for the past 2 months: [place] ?Lives with:: Relatives ?Patient language and need for interpreter reviewed:: Yes ?       ?Need for Family Participation in Patient Care: Yes (Comment) ?Care giver support system in place?: Yes (comment) ?  ?Criminal Activity/Legal Involvement Pertinent to Current Situation/Hospitalization: No - Comment as needed ? ?Activities of Daily Living ?Home Assistive Devices/Equipment: CBG Meter ?ADL Screening (condition at time of admission) ?Patient's cognitive ability adequate to safely complete daily activities?: Yes ?Is the patient deaf or have difficulty hearing?: No ?Does the patient have difficulty seeing, even when wearing glasses/contacts?: No ?Does the patient have difficulty concentrating, remembering, or making decisions?: No ?Patient able to express need for assistance with ADLs?: Yes ?Does the patient have difficulty dressing or bathing?: No ?Independently performs ADLs?: Yes (appropriate for developmental age) ?Does the patient have difficulty walking or climbing stairs?: No ?Weakness of Legs: None ?Weakness of Arms/Hands: None ? ?Emotional Assessment ? Orientation: : Oriented to Self, Oriented to Place, Oriented to  Time, Oriented to Situation ?Alcohol /  Substance Use: Not Applicable ?Psych Involvement: No (comment) ? 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 Diagnosis Date Noted  ? Elevated d-dimer 08/25/2021  ? History of anxiety/bipolar disorder 08/25/2021  ? Uncontrolled type 1 diabetes mellitus with hyperglycemia, with long-term current use of insulin ([place]) 08/24/2021  ? Dehydration 08/24/2021  ? Elevated serum creatinine 08/24/2021  ? Hypokalemia 08/24/2021  ? Opacity of lung on imaging study 08/24/2021  ? Cyst of right kidney 08/24/2021  ? Atypical chest pain 08/24/2021  ? Acute metabolic encephalopathy 0000000  ? Fever 08/24/2021  ? Fall   ? Right facial numbness 08/19/2021  ? Abnormal MRI, kidney 08/19/2021  ? Fall due to ambulatory dysfunction 08/18/2021  ? Neuropathy 08/18/2021  ? History of bacteremia 08/18/2021  ? History of COVID-19 08/18/2021  ? Unresponsive episode 08/18/2021  ? Chronic pain syndrome 08/17/2021  ? ?PCP:  System, Provider Not In ?Pharmacy:   ?[facility] ?[address] [place] [address] ?Phone: [phone] Fax: [phone] ? ?[place] [place] [facility] [place] [address] ?Phone: [phone] Fax: [phone] ? ? ?Readmission Risk Interventions ? ?  08/25/2021  ?  3:07 PM  ?Readmission Risk Prevention Plan  ?Transportation Screening Complete  ?PCP or Specialist Appt within 5-7 Days Complete  ? ? ? ?

## 2021-08-25 NOTE — Assessment & Plan Note (Addendum)
From renal abscess?  Blood cultures NGTD. ?-Antibiotics as above ?

## 2021-08-25 NOTE — Progress Notes (Signed)
Bilateral lower extremity venous duplex has been completed. ?Preliminary results can be found in CV Proc through chart review.  ? ?08/25/21 9:20 AM ?Olen Cordial RVT   ?

## 2021-08-25 NOTE — Assessment & Plan Note (Addendum)
LE Doppler negative for DVT.  CTA chest negative for PE but right perinephric abscess, possible septic emboli and right pleural effusion. ?

## 2021-08-25 NOTE — Progress Notes (Addendum)
Pharmacy Antibiotic Note ? ?Victor Matthews is a 36 y.o. male admitted on 08/24/2021 with multiple issues including fever and was empirically started on ceftriaxone and metronidazole.  Today, Pharmacy has been consulted for Zosyn dosing for IAI. ? ?Imaging: ?4/12 CT angio Chest Pulmonary Embolism:  No evidence of PE.  In upper abdomen - Gas containing right perinephric abscess, partially visualized. ?4/12 CT abdomen pelvis w contrast ordered ? ?Plan: ?Zosyn 3.375g IV q8h (4 hour infusion). ?Monitor clinical progress, renal function ?F/U C&S, abx deescalation / LOT ? ? ?Height: 6' (182.9 cm) ?Weight: 98.4 kg (216 lb 14.9 oz) ?IBW/kg (Calculated) : 77.6 ? ?Temp (24hrs), Avg:99.4 ?F (37.4 ?C), Min:98.3 ?F (36.8 ?C), Max:100.6 ?F (38.1 ?C) ? ?Recent Labs  ?Lab 08/20/21 ?1450 08/24/21 ?1497 08/24/21 ?1454 08/24/21 ?1739 08/24/21 ?1919 08/24/21 ?2240 08/25/21 ?0304 08/25/21 ?0263 08/25/21 ?0759 08/25/21 ?1038  ?WBC 5.8 10.9*  --   --   --   --   --   --   --   --   ?CREATININE 1.53* 2.16* 1.72*  --  1.47* 1.48* 1.45* 1.59*  --   --   ?LATICACIDVEN  --   --   --  2.0* 2.3*  --   --   --  1.5 0.9  ?  ?Estimated Creatinine Clearance: 79.5 mL/min (A) (by C-G formula based on SCr of 1.59 mg/dL (H)).   ? ?No Known Allergies ? ?Antimicrobials this admission: ?4/11-4/12 ceftriazone and metronidazole ?4/12 ZEI >>  ? ?Microbiology results: ?4/12 BCx: ordered ?4/11 MRSA PCR: neg ? ?Thank you for allowing pharmacy to be a part of this patient?s care. ? ?Selinda Eon, PharmD, BCPS ?Clinical Pharmacist ?[place] ?Please utilize Amion for appropriate phone number to reach the unit pharmacist [facility]) ?08/25/2021 2:35 PM ? ? ?

## 2021-08-25 NOTE — Assessment & Plan Note (Addendum)
Felt to be reactive secondary to renal abscess. IR thoracocentesis ordered but pleural effusion felt to be small on chest [place]. ?

## 2021-08-25 NOTE — Progress Notes (Signed)
Inpatient Diabetes Program Recommendations ? ?AACE/ADA: New Consensus Statement on Inpatient Glycemic Control (2015) ? ?Target Ranges:  Prepandial:   less than 140 mg/dL ?     Peak postprandial:   less than 180 mg/dL (1-2 hours) ?     Critically ill patients:  140 - 180 mg/dL  ? ?Lab Results  ?Component Value Date  ? GLUCAP 218 (H) 08/25/2021  ? HGBA1C 14.6 (H) 08/18/2021  ? ? ?Review of Glycemic Control ? ?Diabetes history: DM1 ?Outpatient Diabetes medications: Levemir 40 QD and Humalog 35 units TID ?Current orders for Inpatient glycemic control: Levemir 25 units BID, Novolog 0-15 units TID with meals and 0-5 HS + 4 units TID ? ?HgbA1C - 14.6% ? ?Inpatient Diabetes Program Recommendations:   ? ?Increase Novolog to 8 units TID with meals ? ?Spoke with pt several days ago during last admission. Pt finds it very difficult to follow CHO-mod diet. Long discussions with pt about HgbA1C of 14.6% and importance of controlling blood sugars to reduce risks of complications from his diabetes. Saw pt today at bedside, said he wasn't feeling well and wished to speak at later time.  ? ?Will see again in am.  ? ?Thank you. ?Ailene Ards, RD, LDN, CDE ?Inpatient Diabetes Coordinator ?[phone]  ? ? ? ? ?

## 2021-08-25 NOTE — Progress Notes (Signed)
Pt requesting sister Denman George to be taken off his contact list and does not want any information given to her at this time - all calls should be forwarded to patient.  Updated patient contacts and reviewed with patient. ?

## 2021-08-25 NOTE — Consult Note (Signed)
Urology Consult ? ?Consulting MD: Dr Alanda Slim ? ?CC: Perinephric abscess ? ?HPI: ?This is a 36year old male with overall poor health-type 1 diabetes with poor glucose control, neuropathy admitted at this time for hyperglycemia as well as recent fall at home that he attributed to diabetic neuropathy. ? ?In addition to his diabetes and neuropathy he is followed for chronic pain, anxiety, bipolar disorder.  He was recently admitted to a hospital in [place], [place] for urinary tract infection with bacteremia.  He grew ESBL E. coli.  He had a CT done there which revealed a fairly normal-looking right kidney but mild ureteral changes. ? ?With him complaining of chest pain, he ended up having CT angio of chest.  This revealed a significant right pleural effusion as well as a perinephric abscess.  It incompletely visualized the right kidney.  There was also perinephric gas.  CT abdomen and pelvis was then performed confirming his perinephric fluid and gas collection. ? ?He is having abdominal pain.  He is not having significant shakes, chills or fevers.  He is having no gross hematuria or dysuria.  He denies longstanding history of urinary tract infections. ? ?PMH: ?Past Medical History:  ?Diagnosis Date  ? Diabetes mellitus without complication (HCC)   ? Neuropathy   ? ? ?PSH: ?Past Surgical History:  ?Procedure Laterality Date  ? HEMORRHOID SURGERY    ? ? ?Allergies: ?No Known Allergies ? ?Medications: ?Medications Prior to Admission  ?Medication Sig Dispense Refill Last Dose  ? FLUoxetine (PROZAC) 20 MG capsule Take 20 mg by mouth daily as needed (anxiety attacks).   08/24/2021  ? insulin aspart protamine- aspart (NOVOLOG MIX 70/30) (70-30) 100 UNIT/ML injection Inject 0.4 mLs (40 Units total) into the skin with breakfast, with lunch, and with evening meal. (Patient taking differently: Inject 30 Units into the skin with breakfast, with lunch, and with evening meal.) 10 mL 11 08/23/2021  ? insulin detemir (LEVEMIR) 100  UNIT/ML injection Inject 0.3 mLs (30 Units total) into the skin daily. (Patient taking differently: Inject 40 Units into the skin at bedtime.) 10 mL 11 08/23/2021  ? pregabalin (LYRICA) 300 MG capsule Take 1 capsule (300 mg total) by mouth in the morning, at noon, and at bedtime. 90 capsule 0 08/23/2021  ? QUEtiapine (SEROQUEL) 100 MG tablet Take 100 mg by mouth at bedtime as needed (anxiety).   08/23/2021  ? traZODone (DESYREL) 150 MG tablet Take 1 tablet (150 mg total) by mouth at bedtime. 30 tablet 0 08/23/2021  ? ? ? ?Social History: ?Social History  ? ?Socioeconomic History  ? Marital status: Single  ?  Spouse name: Not on file  ? Number of children: Not on file  ? Years of education: Not on file  ? Highest education level: Not on file  ?Occupational History  ? Not on file  ?Tobacco Use  ? Smoking status: Every Day  ?  Types: Cigarettes  ? Smokeless tobacco: Never  ?Vaping Use  ? Vaping Use: Never used  ?Substance and Sexual Activity  ? Alcohol use: Yes  ? Drug use: Never  ? Sexual activity: Not on file  ?Other Topics Concern  ? Not on file  ?Social History Narrative  ? Not on file  ? ?Social Determinants of Health  ? ?Financial Resource Strain: Not on file  ?Food Insecurity: Not on file  ?Transportation Needs: Not on file  ?Physical Activity: Not on file  ?Stress: Not on file  ?Social Connections: Not on file  ?Intimate Partner  Violence: Not on file  ? ? ?Family History: ?History reviewed. No pertinent family history. ? ?Review of Systems: ?Positive: Abdominal/flank pain, weakness.  Poor glucose control ?Negative:   A further 10 point review of systems was negative except what is listed in the HPI. ? ?Physical Exam: ?@VITALS2 @ ?General: No acute distress.  Awake. ?Head:  Normocephalic.  Atraumatic. ?ENT:  EOMI.  Mucous membranes moist ?Neck:  Supple.  No lymphadenopathy. ?CV:  Regular rate. ?Pulmonary: Equal effort bilaterally.   ?Abdomen: Soft.  Right upper quadrant and CVA tenderness. ?Skin:  Normal turgor.  No  visible rash. ?Extremity: No gross deformity of extremities.  ?Neurologic: Alert. Appropriate mood. ? ?Studies: ? ?Recent Labs  ?  08/24/21 ?10/24/21 08/24/21 ?10/24/21  ?HGB 10.7* 12.2*  ?WBC 10.9*  --   ?PLT 262  --   ? ? ?Recent Labs  ?  08/25/21 ?0304 08/25/21 ?0644  ?NA 135 134*  ?K 3.7 3.7  ?CL 102 102  ?CO2 24 24  ?BUN 14 14  ?CREATININE 1.45* 1.59*  ?CALCIUM 8.4* 8.3*  ?GFRNONAA >60 58*  ?  ? ?No results for input(s): INR, APTT in the last 72 hours. ? ?Invalid input(s): PT  ? ?Invalid input(s): ABG ? ?I independently reviewed CT images, old records, laboratories.  The patient also was able to see his process on CT scan. ? ?Assessment: Perinephric abscess.  Most likely ESBL E. coli as he grew this 2 months ago while in [place], [place].  He is remarkably stable despite this process that is most likely somewhat gas producing. ? ?Plan: ?I agree with broad-spectrum antibiotic management to cover ESBL E. coli. ? ?I spoke with the patient about proceeding with percutaneous drainage of this abscess.  I think this is the best first step.  I will get that ordered ? ?We will follow him during his hospitalization.  I will be unavailable, but will have one of my associates follow him ? ? ? ?[phone] ? ?

## 2021-08-26 ENCOUNTER — Inpatient Hospital Stay (HOSPITAL_COMMUNITY): Payer: Medicare Other

## 2021-08-26 DIAGNOSIS — D649 Anemia, unspecified: Secondary | ICD-10-CM

## 2021-08-26 DIAGNOSIS — R0789 Other chest pain: Secondary | ICD-10-CM

## 2021-08-26 DIAGNOSIS — G9341 Metabolic encephalopathy: Secondary | ICD-10-CM

## 2021-08-26 DIAGNOSIS — N281 Cyst of kidney, acquired: Secondary | ICD-10-CM

## 2021-08-26 DIAGNOSIS — R7989 Other specified abnormal findings of blood chemistry: Secondary | ICD-10-CM

## 2021-08-26 DIAGNOSIS — R509 Fever, unspecified: Secondary | ICD-10-CM

## 2021-08-26 DIAGNOSIS — R918 Other nonspecific abnormal finding of lung field: Secondary | ICD-10-CM

## 2021-08-26 DIAGNOSIS — E860 Dehydration: Secondary | ICD-10-CM

## 2021-08-26 DIAGNOSIS — F39 Unspecified mood [affective] disorder: Secondary | ICD-10-CM

## 2021-08-26 DIAGNOSIS — N151 Renal and perinephric abscess: Secondary | ICD-10-CM

## 2021-08-26 DIAGNOSIS — E876 Hypokalemia: Secondary | ICD-10-CM

## 2021-08-26 DIAGNOSIS — A419 Sepsis, unspecified organism: Secondary | ICD-10-CM

## 2021-08-26 DIAGNOSIS — E1065 Type 1 diabetes mellitus with hyperglycemia: Secondary | ICD-10-CM

## 2021-08-26 DIAGNOSIS — W19XXXA Unspecified fall, initial encounter: Secondary | ICD-10-CM

## 2021-08-26 DIAGNOSIS — J90 Pleural effusion, not elsewhere classified: Secondary | ICD-10-CM

## 2021-08-26 LAB — CBC WITH DIFFERENTIAL/PLATELET
Abs Immature Granulocytes: 0.04 10*3/uL (ref 0.00–0.07)
Basophils Absolute: 0 10*3/uL (ref 0.0–0.1)
Basophils Relative: 0 %
Eosinophils Absolute: 0.1 10*3/uL (ref 0.0–0.5)
Eosinophils Relative: 1 %
HCT: 24.2 % — ABNORMAL LOW (ref 39.0–52.0)
Hemoglobin: 8.3 g/dL — ABNORMAL LOW (ref 13.0–17.0)
Immature Granulocytes: 0 %
Lymphocytes Relative: 13 %
Lymphs Abs: 1.2 10*3/uL (ref 0.7–4.0)
MCH: 27.3 pg (ref 26.0–34.0)
MCHC: 34.3 g/dL (ref 30.0–36.0)
MCV: 79.6 fL — ABNORMAL LOW (ref 80.0–100.0)
Monocytes Absolute: 1 10*3/uL (ref 0.1–1.0)
Monocytes Relative: 11 %
Neutro Abs: 7.1 10*3/uL (ref 1.7–7.7)
Neutrophils Relative %: 75 %
Platelets: 225 10*3/uL (ref 150–400)
RBC: 3.04 MIL/uL — ABNORMAL LOW (ref 4.22–5.81)
RDW: 13.7 % (ref 11.5–15.5)
WBC: 9.4 10*3/uL (ref 4.0–10.5)
nRBC: 0 % (ref 0.0–0.2)

## 2021-08-26 LAB — CBC
HCT: 23.3 % — ABNORMAL LOW (ref 39.0–52.0)
HCT: 25.2 % — ABNORMAL LOW (ref 39.0–52.0)
Hemoglobin: 7.8 g/dL — ABNORMAL LOW (ref 13.0–17.0)
Hemoglobin: 8.5 g/dL — ABNORMAL LOW (ref 13.0–17.0)
MCH: 27 pg (ref 26.0–34.0)
MCH: 27.2 pg (ref 26.0–34.0)
MCHC: 33.5 g/dL (ref 30.0–36.0)
MCHC: 33.7 g/dL (ref 30.0–36.0)
MCV: 80.6 fL (ref 80.0–100.0)
MCV: 80.5 fL (ref 80.0–100.0)
Platelets: 208 10*3/uL (ref 150–400)
Platelets: 217 10*3/uL (ref 150–400)
RBC: 2.89 MIL/uL — ABNORMAL LOW (ref 4.22–5.81)
RBC: 3.13 MIL/uL — ABNORMAL LOW (ref 4.22–5.81)
RDW: 13.6 % (ref 11.5–15.5)
RDW: 13.8 % (ref 11.5–15.5)
WBC: 8.9 10*3/uL (ref 4.0–10.5)
WBC: 9.9 10*3/uL (ref 4.0–10.5)
nRBC: 0 % (ref 0.0–0.2)
nRBC: 0 % (ref 0.0–0.2)

## 2021-08-26 LAB — GLUCOSE, CAPILLARY
Glucose-Capillary: 104 mg/dL — ABNORMAL HIGH (ref 70–99)
Glucose-Capillary: 119 mg/dL — ABNORMAL HIGH (ref 70–99)
Glucose-Capillary: 129 mg/dL — ABNORMAL HIGH (ref 70–99)
Glucose-Capillary: 171 mg/dL — ABNORMAL HIGH (ref 70–99)
Glucose-Capillary: 175 mg/dL — ABNORMAL HIGH (ref 70–99)
Glucose-Capillary: 251 mg/dL — ABNORMAL HIGH (ref 70–99)
Glucose-Capillary: 91 mg/dL (ref 70–99)

## 2021-08-26 LAB — COMPREHENSIVE METABOLIC PANEL
ALT: 15 U/L (ref 0–44)
AST: 25 U/L (ref 15–41)
Albumin: 2.3 g/dL — ABNORMAL LOW (ref 3.5–5.0)
Alkaline Phosphatase: 173 U/L — ABNORMAL HIGH (ref 38–126)
Anion gap: 8 (ref 5–15)
BUN: 15 mg/dL (ref 6–20)
CO2: 22 mmol/L (ref 22–32)
Calcium: 7.9 mg/dL — ABNORMAL LOW (ref 8.9–10.3)
Chloride: 101 mmol/L (ref 98–111)
Creatinine, Ser: 1.74 mg/dL — ABNORMAL HIGH (ref 0.61–1.24)
GFR, Estimated: 52 mL/min — ABNORMAL LOW (ref >60)
Glucose, Bld: 257 mg/dL — ABNORMAL HIGH (ref 70–99)
Potassium: 4.2 mmol/L (ref 3.5–5.1)
Sodium: 131 mmol/L — ABNORMAL LOW (ref 135–145)
Total Bilirubin: 0.5 mg/dL (ref 0.3–1.2)
Total Protein: 6.7 g/dL (ref 6.5–8.1)

## 2021-08-26 LAB — MAGNESIUM: Magnesium: 1.9 mg/dL (ref 1.7–2.4)

## 2021-08-26 LAB — CK: Total CK: 62 U/L (ref 49–397)

## 2021-08-26 LAB — PHOSPHORUS: Phosphorus: 3 mg/dL (ref 2.5–4.6)

## 2021-08-26 LAB — TSH: TSH: 4.348 u[IU]/mL (ref 0.350–4.500)

## 2021-08-26 IMAGING — CT US CHEST/MEDIASTINUM
1 of 3 series · 14 of 32 positions shown, 20 images · non-contrast
Comparison: None.

CLINICAL DATA: Pleural effusion

EXAM:
CHEST ULTRASOUND

[Series 2: i-spiral 5.0 bf37 · axial · 0.98mm/px · z∈[-432,-310]mm · 14 of 41 slices shown, 20 images]
[im 3/41  soft-tissue]
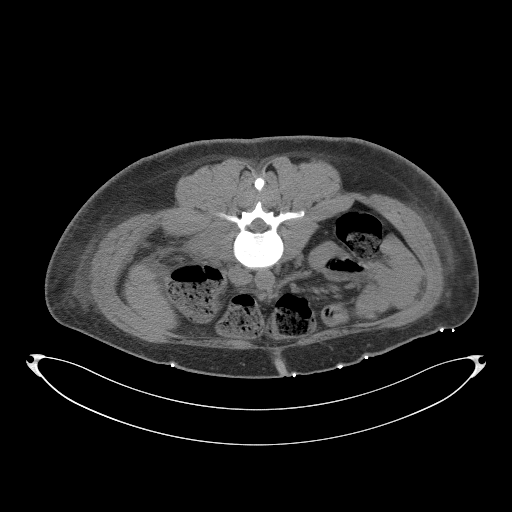
[im 3/41  bone]
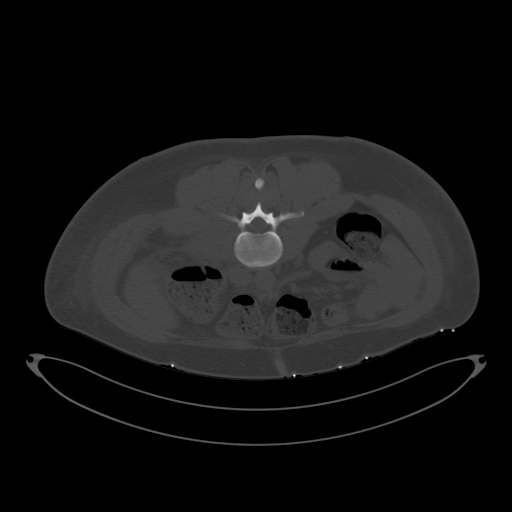
[im 6/41  soft-tissue]
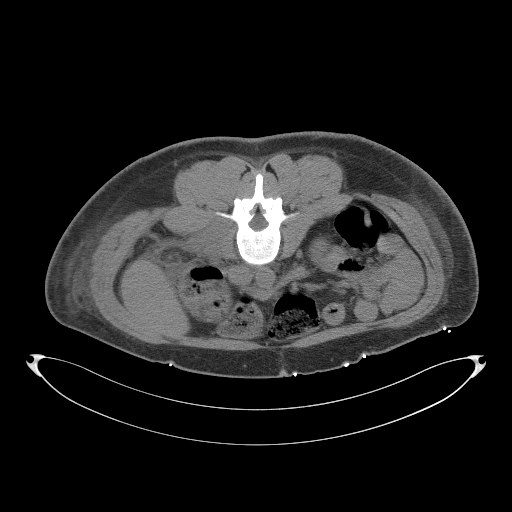
[im 8/41  soft-tissue]
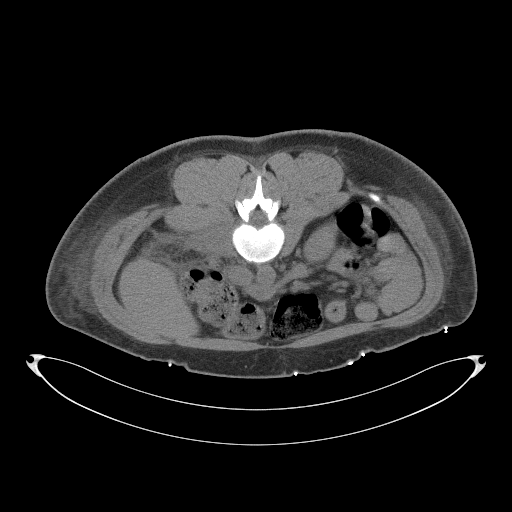
[im 11/41  soft-tissue]
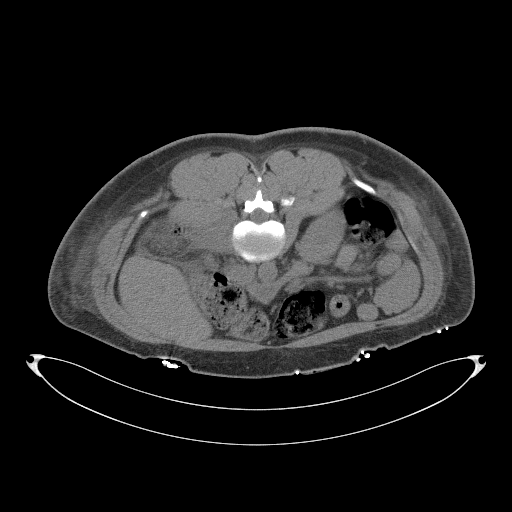
[im 13/41  soft-tissue]
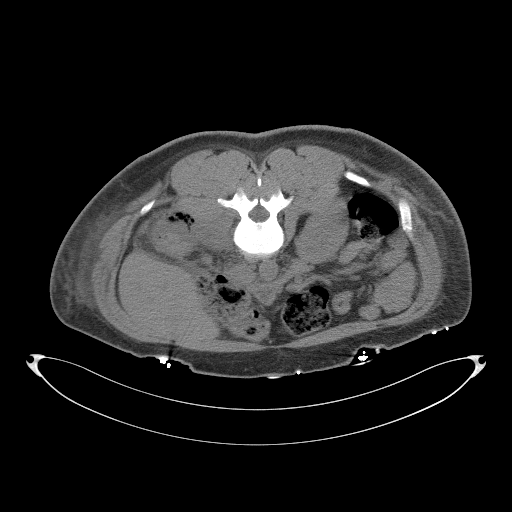
[im 16/41  soft-tissue]
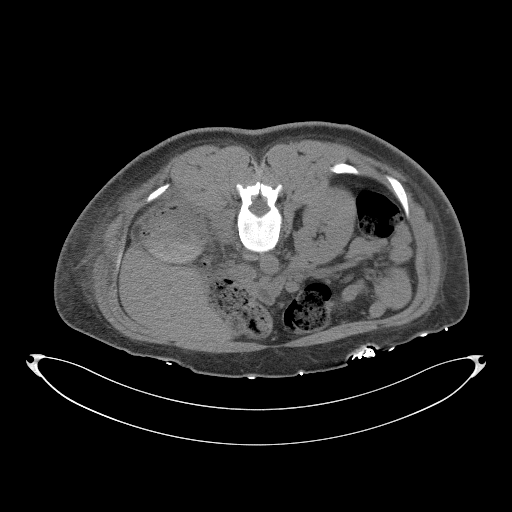
[im 18/41  soft-tissue]
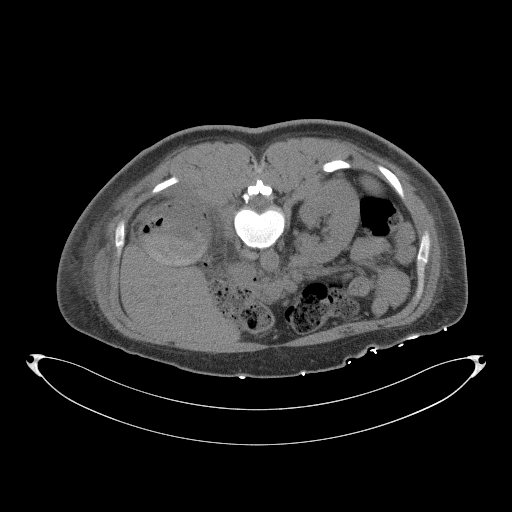
[im 23/41  soft-tissue]
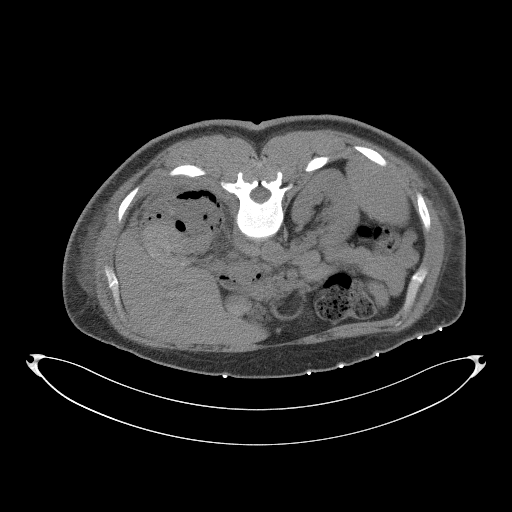
[im 26/41  soft-tissue]
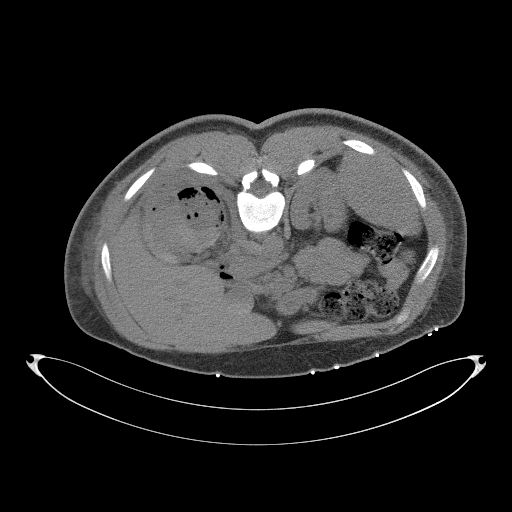
[im 26/41  bone]
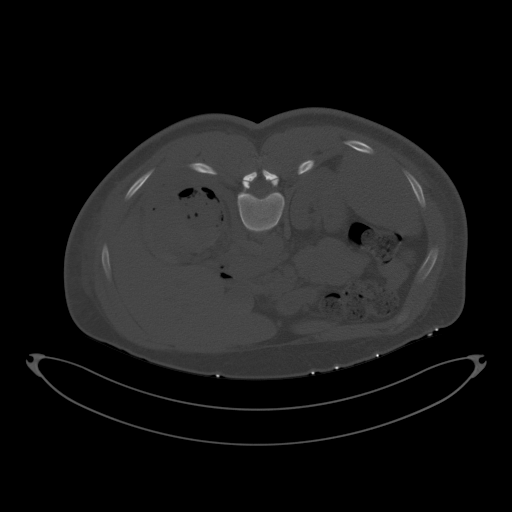
[im 28/41  soft-tissue]
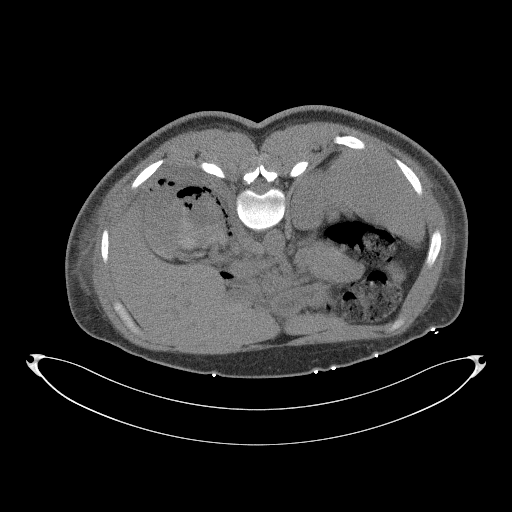
[im 31/41  soft-tissue]
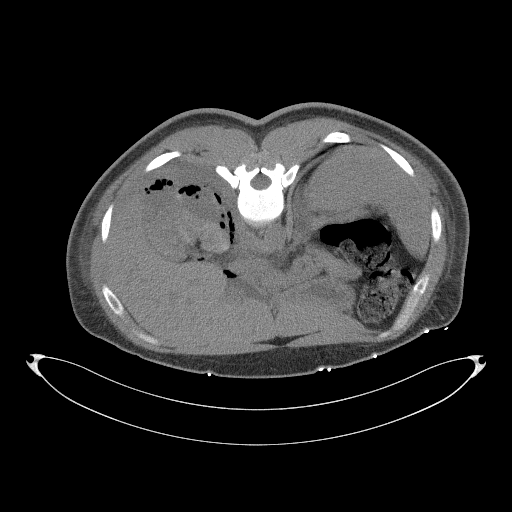
[im 31/41  lung]
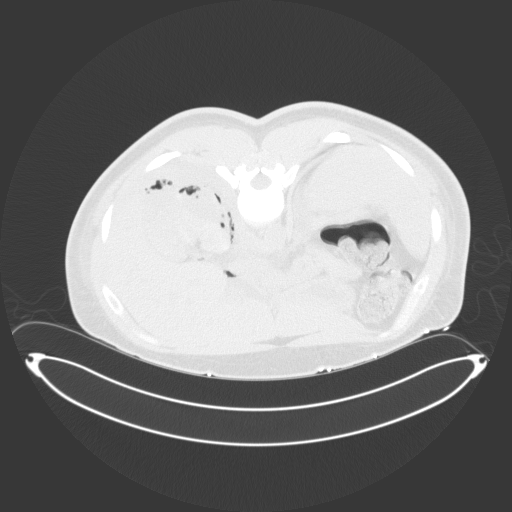
[im 33/41  soft-tissue]
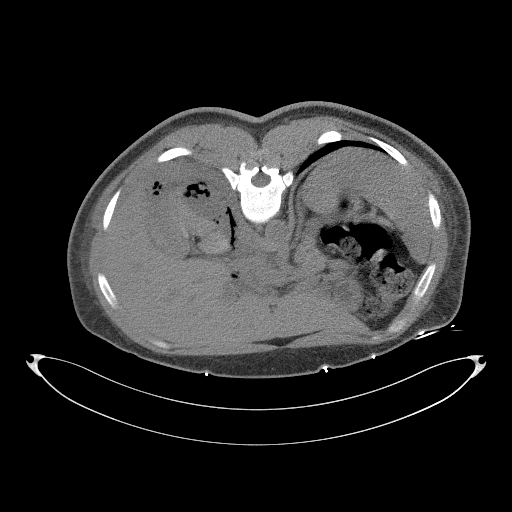
[im 33/41  lung]
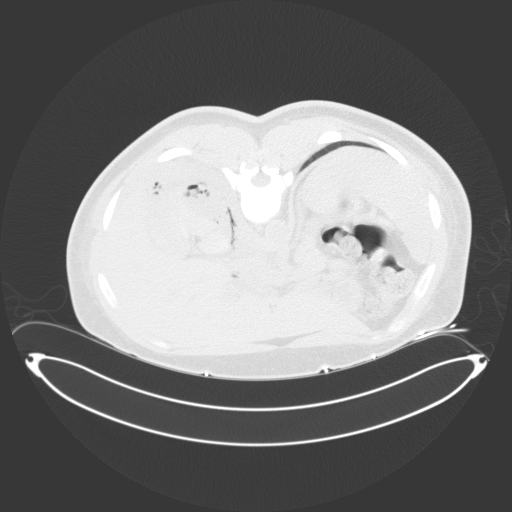
[im 36/41  soft-tissue]
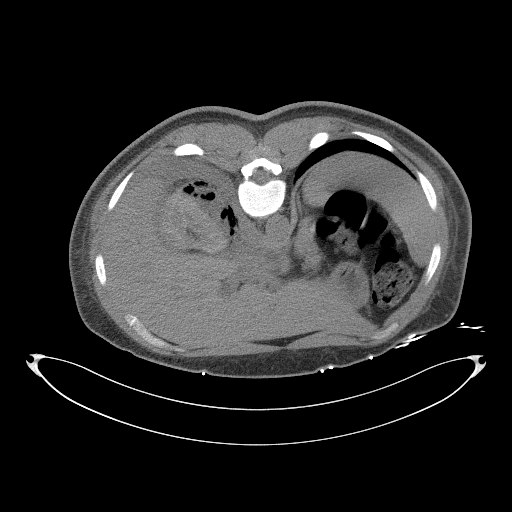
[im 36/41  lung]
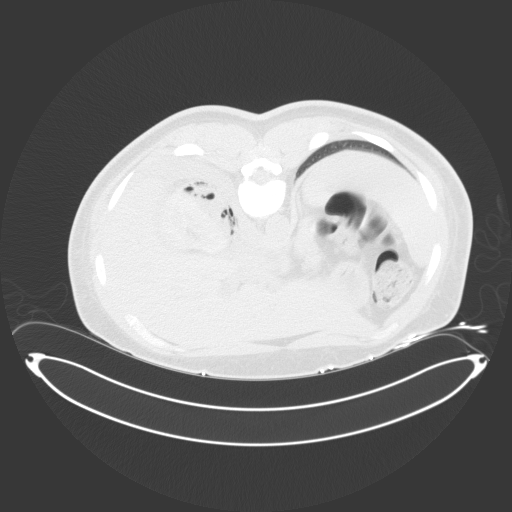
[im 38/41  soft-tissue]
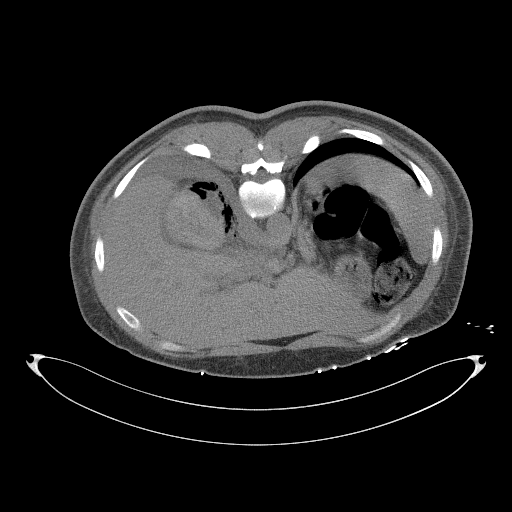
[im 38/41  lung]
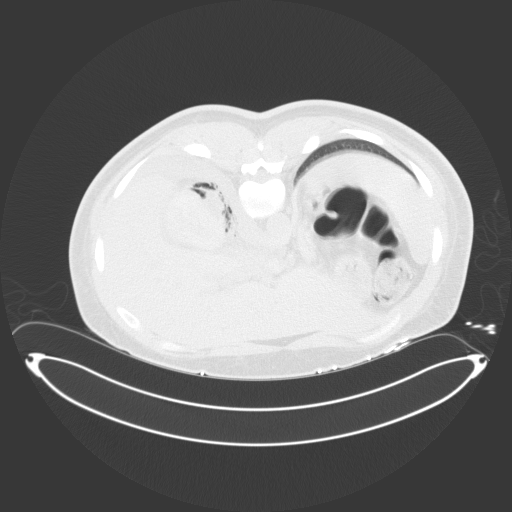

[14 of 32 positions shown; findings below may reference images not displayed]

FINDINGS: Limited ultrasound of the chest was performed for evaluation of
pleural fluid. Images demonstrate small volume right pleural
effusion, not amenable for safe image guided thoracentesis. No
intervention performed.
IMPRESSION: Small volume right pleural effusion.  No intervention performed.

## 2021-08-26 MED ORDER — FENTANYL CITRATE (PF) 100 MCG/2ML IJ SOLN
INTRAMUSCULAR | Status: AC
  Filled 2021-08-26: qty 2

## 2021-08-26 MED ORDER — ENSURE MAX PROTEIN PO LIQD
11.0000 [oz_av] | Freq: Every day | ORAL | Status: DC
  Administered 2021-08-27 – 2021-08-28 (×2): 11 [oz_av] via ORAL
  Filled 2021-08-26 (×2): qty 330

## 2021-08-26 MED ORDER — FENTANYL CITRATE (PF) 100 MCG/2ML IJ SOLN
INTRAMUSCULAR | Status: AC | PRN
  Administered 2021-08-26 (×2): 50 ug via INTRAVENOUS

## 2021-08-26 MED ORDER — ADULT MULTIVITAMIN W/MINERALS CH
1.0000 | ORAL_TABLET | Freq: Every day | ORAL | Status: DC
  Administered 2021-08-26 – 2021-09-02 (×8): 1 via ORAL
  Filled 2021-08-26 (×8): qty 1

## 2021-08-26 MED ORDER — LIDOCAINE HCL (PF) 1 % IJ SOLN
INTRAMUSCULAR | Status: AC | PRN
  Administered 2021-08-26: 20 mL

## 2021-08-26 MED ORDER — MIDAZOLAM HCL 2 MG/2ML IJ SOLN
INTRAMUSCULAR | Status: AC | PRN
Start: 2021-08-26 — End: 2021-08-26
  Administered 2021-08-26: 2 mg via INTRAVENOUS

## 2021-08-26 MED ORDER — MIDAZOLAM HCL 2 MG/2ML IJ SOLN
INTRAMUSCULAR | Status: AC
  Filled 2021-08-26: qty 4

## 2021-08-26 MED ORDER — LIDOCAINE HCL 1 % IJ SOLN
INTRAMUSCULAR | Status: AC
  Filled 2021-08-26: qty 20

## 2021-08-26 MED ORDER — ONDANSETRON HCL 4 MG/2ML IJ SOLN
4.0000 mg | Freq: Four times a day (QID) | INTRAMUSCULAR | Status: DC | PRN
Start: 2021-08-26 — End: 2021-09-03
  Administered 2021-08-26: 4 mg via INTRAVENOUS
  Filled 2021-08-26: qty 2

## 2021-08-26 NOTE — Progress Notes (Signed)
?Subjective: ?Pt is complaining of mild right sided flank pain.  AFVSS overnight.   ? ?Objective: ?Vital signs in last 24 hours: ?Temp:  [99.1 ?F (37.3 ?C)-100 ?F (37.8 ?C)] 99.1 ?F (37.3 ?C) (04/13 1100) ?Pulse Rate:  [95-123] 100 (04/13 1100) ?Resp:  [18-26] 24 (04/13 1100) ?BP: (137-170)/(66-106) 146/83 (04/13 1000) ?SpO2:  [91 %-100 %] 99 % (04/13 1100) ? ?Intake/Output from previous day: ?04/12 0701 - 04/13 0700 ?In: 3002.4 [P.O.:240; I.V.:2552.1; IV Piggyback:210.2] ?Out: 3700 [Urine:3700] ? ?Intake/Output this shift: ?Total I/O ?In: 388.5 [I.V.:348.9; IV Piggyback:39.6] ?Out: 1000 [Urine:1000] ? ?Physical Exam:  ?General: Alert and oriented ? ? ?Lab Results: ?Recent Labs  ?  08/24/21 ?6468 08/26/21 ?0300 08/26/21 ?0321  ?HGB 12.2* 7.8* 8.5*  ?HCT 36.0* 23.3* 25.2*  ? ?BMET ?Recent Labs  ?  08/25/21 ?0644 08/26/21 ?0300  ?NA 134* 131*  ?K 3.7 4.2  ?CL 102 101  ?CO2 24 22  ?GLUCOSE 184* 257*  ?BUN 14 15  ?CREATININE 1.59* 1.74*  ?CALCIUM 8.3* 7.9*  ? ? ? ?Studies/Results: ?CT ABDOMEN PELVIS WO CONTRAST ? ?Result Date: 08/25/2021 ?CLINICAL DATA:  Follow-up right renal abscess. EXAM: CT ABDOMEN AND PELVIS WITHOUT CONTRAST TECHNIQUE: Multidetector CT imaging of the abdomen and pelvis was performed following the standard protocol without IV contrast. RADIATION DOSE REDUCTION: This exam was performed according to the departmental dose-optimization program which includes automated exposure control, adjustment of the mA and/or kV according to patient size and/or use of iterative reconstruction technique. COMPARISON:  Chest CT, same date.  Lumbar spine MRI 08/18/2021 FINDINGS: Lower chest: Moderate-sized right pleural effusion with overlying atelectasis. The left lung is clear. The heart is normal in size. Hepatobiliary: No hepatic lesions are identified. No intrahepatic biliary dilatation. Normal caliber common bile duct. Pancreas: No mass, inflammation or ductal dilatation. Spleen: Normal size.  No focal lesions.  Adrenals/Urinary Tract: Adrenal glands are normal. There is a large gas containing renal and perinephric abscess. This involves the posterior renal parenchyma extending into the perinephric space with there is fluid and gas also breaking out into the retroperitoneum surrounding the IVC. It measures approximately 8.3 x 8.0 x 4.0 cm. There is a small amount of contrast in the renal collecting systems from the prior contrast enhanced chest CT. The left kidney is unremarkable. No bladder lesions are identified. Stomach/Bowel: The stomach, duodenum, small bowel and colon are grossly normal. Vascular/Lymphatic: The aorta is normal in caliber. No atheroscerlotic calcifications. No mesenteric of retroperitoneal mass or adenopathy. Small scattered lymph nodes are noted. Reproductive: The prostate gland and seminal vesicles are unremarkable. Other: Scattered free abdominal fluid mainly in the right pericolic gutter and in the pelvis likely associated with the renal abscess. Musculoskeletal: No significant bony findings. IMPRESSION: 1. Large gas containing right renal and perinephric abscess measuring approximately 8.3 x 8.0 x 4.0 cm. 2. Moderate-sized right pleural effusion with overlying atelectasis. 3. Scattered free abdominal fluid mainly in the right paracolic gutter and in the pelvis likely associated with the renal abscess. Electronically Signed   By: Rudie Meyer M.D.   On: 08/25/2021 16:33  ? ?CT Angio Chest Pulmonary Embolism (PE) W or WO Contrast ? ?Result Date: 08/25/2021 ?CLINICAL DATA:  36 year old male with generalized weakness, positive D-dimer, concern for pulmonary embolism. EXAM: CT ANGIOGRAPHY CHEST WITH CONTRAST TECHNIQUE: Multidetector CT imaging of the chest was performed using the standard protocol during bolus administration of intravenous contrast. Multiplanar CT image reconstructions and MIPs were obtained to evaluate the vascular anatomy. RADIATION DOSE REDUCTION: This exam  was performed according  to the departmental dose-optimization program which includes automated exposure control, adjustment of the mA and/or kV according to patient size and/or use of iterative reconstruction technique. CONTRAST:  Eighty mL Omnipaque 350, intravenous COMPARISON:  None. FINDINGS: Cardiovascular: Satisfactory opacification of the pulmonary arteries to the segmental level. No evidence of pulmonary embolism. Moderate global cardiomegaly. No pericardial effusion. Mediastinum/Nodes: No enlarged mediastinal, hilar, or axillary lymph nodes. Thyroid gland, trachea, and esophagus demonstrate no significant findings. Lungs/Pleura: Moderate right pleural effusion with associated right basilar passive atelectasis. Rounded consolidative opacity in the perifissural posterior left upper lobe measuring approximately 1.6 x 1.4 cm. Upper Abdomen: Partially visualized right subcapsular versus perinephric gas containing abscess. There are few foci of gas tracking superiorly into the right suprarenal and periaortic spaces. The remaining visualized upper abdomen is within normal limits. Musculoskeletal: No chest wall abnormality. No acute or significant osseous findings. Review of the MIP images confirms the above findings. IMPRESSION: Vascular: 1. No acute pulmonary embolism. 2. Moderate global cardiomegaly. Non-Vascular: 1. Gas containing right perinephric abscess, partially visualized. There is associated trace right pneumoretroperitoneum. 2. Moderate right pleural effusion with associated right basilar passive atelectasis. This is likely reactive secondary to renal abscess. 3. Rounded opacity in the left upper lobe measuring up to 1.6 cm, favored represent septic emboli over neoplasm. Recommend dedicated CT abdomen pelvis with contrast for further characterization and Urology consultation. These results were relayed by secure EMR chat at the time of interpretation on 08/25/2021 at 2:25 pm to provider TAYE GONFA . Marliss Coots, MD [facility] Electronically Signed   By: Marliss Coots M.D.   On: 08/25/2021 14:26  ? ?US RENAL ? ?Result Date: 08/24/2021 ?CLINICAL DATA:  Acute kidney injury. Evaluate right renal cyst seen on lumbar spine MRI. EXAM: RENAL / URINARY TRACT ULTRASOUND COMPLETE COMPARISON:  Lumbar spine MRI 08/18/2021 FINDINGS: Right Kidney: Renal measurements: 11.3 x 5.3 x 6.0 cm. = volume: 188 mL. Right kidney is not well visualized. There is a poorly defined hypoechoic structure in the right kidney that measures 2.4 x 2.1 x 2.4 cm and not compatible with a simple cyst. Renal pelvis is likely prominent. No definite caliceal dilatation. Left Kidney: Renal measurements: 9.9 x 6.2 x 4.4 cm = volume: 139 mL. Left kidney is poorly characterized and obscured by bowel gas. No definite hydronephrosis. Bladder: Appears normal for degree of bladder distention. Other: None. IMPRESSION: 1. Limited examination of both kidneys. 2. Poorly defined hypoechoic lesion/structure in the right kidney that may correspond with the complex lesion or cyst seen on the previous MRI. Structure is indeterminate based on ultrasound. This could represent an inflammatory or infectious etiology. Neoplastic process cannot be completely excluded. Recommend further characterization with abdominal MRI when the patient can tolerate the procedure and ideally when the patient can receive IV contrast. 3. Right renal pelvis appears to be prominent but no definite hydronephrosis. Electronically Signed   By: Richarda Overlie M.D.   On: 08/24/2021 17:20  ? ?DG CHEST PORT 1 VIEW ? ?Result Date: 08/24/2021 ?CLINICAL DATA:  Fever.  Hyperglycemia. EXAM: PORTABLE CHEST 1 VIEW COMPARISON:  AP chest 08/17/2021 FINDINGS: Cardiac silhouette and mediastinal contours are within normal limits. Mildly decreased lung volumes. Right basilar linear densities. The left lung appears clear. No definite pleural effusion. No pneumothorax. No acute skeletal  abnormality. IMPRESSION: Decreased lung volumes with right basilar bronchovascular crowding. This may represent subsegmental atelectasis. It is difficult to exclude early pneumonia. If clinically indicated,

## 2021-08-26 NOTE — Progress Notes (Signed)
?PROGRESS NOTE ? ?Victor SanesLarry Matthews JXB:[id number] DOB: 04/08/1986  ? ?PCP: System, Provider Not In ? ?Patient is from: Home.  [place]  Lives with his godfather. ? ?DOA: 08/24/2021 LOS: 2 ? ?Chief complaints ?Chief Complaint  ?Patient presents with  ? Hyperglycemia  ?  ? ?Brief Narrative / Interim history: ?36 year old M with PMH of DM-1 with peripheral neuropathy, chronic pain on chronic opiate, anxiety, BPD, E. coli bacteremia and COVID-19 infection in 06/2021, and recent hospitalization from 4/4-4/7 for hyperglycemia and ambulatory dysfunction when he left [facility] with hyperglycemia and falls x2, and admitted for hyperglycemia to 715, AKI, acute metabolic encephalopathy, possible pneumonia and uncontrolled hypertension.  He was started on insulin drip, IV ceftriaxone and IV fluid.  He also had elevated D-dimer in ED. CTA chest ordered to rule out PE and showed right renal and perinephric abscess, moderate right pleural effusion and possible septic emboli to RUL but negative for PE.  Antibiotics broadened to IV Zosyn.  Urology consulted.  CT abdomen and pelvis obtained confirmed renal abscess.  IR consulted for drain placement.  ID consulted for guidance on antibiotics.  ? ?Subjective: ?Seen and examined earlier this morning.  Continues to endorse pain on the right back and right side of abdomen.  He denies chest pain, nausea or vomiting. ? ?Objective: ?Vitals:  ? 08/26/21 1100 08/26/21 1200 08/26/21 1300 08/26/21 1341  ?BP:  (!) 142/86  (!) 143/70  ?Pulse: 100 (!) 101  (!) 103  ?Resp: (!) 24 17 19 16   ?Temp: 99.1 ?F (37.3 ?C)     ?TempSrc: Oral     ?SpO2: 99% 96%  98%  ?Weight:      ?Height:      ? ? ?Examination: ? ?GENERAL: No apparent distress.  Nontoxic. ?HEENT: MMM.  Vision and hearing grossly intact.  ?NECK: Supple.  No apparent JVD.  ?RESP:  No IWOB.  Fair aeration bilaterally. ?CVS:  RRR. Heart sounds normal.  ?ABD/GI/GU: BS+. Abd soft.  Mild diffuse  tenderness in the right abdomen.  No rebound or guarding. ?MSK/EXT:  Moves extremities. No apparent deformity. No edema.  ?SKIN: Scaly skin lesion in his face ?NEURO: Awake and alert. Oriented appropriately.  No apparent focal neuro deficit. ?PSYCH: Calm. Normal affect.  ? ?Procedures:  ?None ? ?Microbiology summarized: ?MRSA PCR screen negative. ?Blood cultures NGTD. ? ?Assessment and Plan: ?* Uncontrolled type 1 diabetes mellitus with hyperglycemia, with long-term current use of insulin (HCC) ?Suspect poor compliance contributing to this.  Does not meet criteria for HHS or DKA.  On Levemir 30 units daily and NovoLog 70/30 at 40 units 3 times daily at home?  A1c 14.6% ?Recent Labs  ?Lab 08/25/21 ?1632 08/25/21 ?2130 08/26/21 ?[id number] 08/26/21 ?0759 08/26/21 ?1137  ?GLUCAP 218* 302* 251* 129* 91  ?-Continue sliding scale. ?-Holding basal insulin and mealtime coverage while n.p.o. ?-Need to adjust home insulin on discharge ?-Appreciate input by diabetic coordinator. ? ? ?Sepsis due to right perinephric abscess ?CTA chest/abdomen/pelvis showed right renal and perinephric abscess with pneumoperitoneum, moderate right pleural effusion and possible septic emboli to RUL.  Of note, patient had an MRI of L-spine on 4/6 that showed heterogeneous, partially cystic lesion of right kidney.    Blood cultures NGTD but obtained after IV ceftriaxone.  MRSA PCR screen negative.  Of note, he was hospitalized for E. coli bacteremia in February. ?-Antibiotic escalated from ceftriaxone to Zosyn ?-Appreciate input by urology ?-IR consulted for drain placement ?-ID consulted  for guidance on antibiotics ? ? ?Possible RUL septic emboli ?From renal abscess?  Blood cultures NGTD. ?-Antibiotics as above ? ?Pleural effusion on right ?Felt to be reactive secondary to renal abscess ?-IR thoracocentesis ordered. ? ?Acute metabolic encephalopathy ?Resolved.  Some concern about polypharmacy. ?-Minimize sedating medications. ? ?Atypical chest  pain ?Serial troponin and EKG negative.  Likely due to renal abscess and pleural effusion. ?-Management as above ? ? ?Fall due to ambulatory dysfunction ?Ongoing since January 2023.  Unclear etiology but patient is at risk for falls due to diabetic neuropathy and polypharmacy.  Multiple imaging including MRI brain, C, T and L spines, and CT head and cervical spine without significant finding to explain his symptoms.  Neuro exam nonfocal.  Some diffuse tenderness on the right from the fall. ?-PT/OT eval ?-Fall precaution ? ?Normocytic anemia ?Recent Labs  ?  08/09/21 ?0313 08/09/21 ?0330 08/17/21 ?1411 08/17/21 ?1452 08/18/21 ?0243 08/20/21 ?1450 08/24/21 ?6834 08/24/21 ?1962 08/26/21 ?0300 08/26/21 ?2297  ?HGB 11.2* 10.5* 11.4* 13.6 10.9* 10.8* 10.7* 12.2* 7.8* 8.5*  ?Baseline Hgb 10-11.  Slight drop likely from hemodilution.  He denies melena or hematochezia.  Doubt intra-abdominal bleed. ?-Continue monitoring ? ? ?Elevated serum creatinine ?Recent Labs  ?  08/18/21 ?0243 08/19/21 ?0433 08/20/21 ?1450 08/24/21 ?9892 08/24/21 ?1454 08/24/21 ?1919 08/24/21 ?2240 08/25/21 ?0304 08/25/21 ?1194 08/26/21 ?0300  ?BUN 10 14 16  23* 19 16 15 14 14 15   ?CREATININE 0.94 1.26* 1.53* 2.16* 1.72* 1.47* 1.48* 1.45* 1.59* 1.74*  ?Creatinine relatively stable.  AKI ruled out. ?-Continue IV fluid ?-Avoid nephrotoxic meds ? ? ?Chronic pain syndrome ?On  MS Contin, Percocet and Lyrica per narcotic database.  Last fill on 07/12/2021.  Reportedly could not fill last month due to illness and hospitalization. ?-Resume home Lyrica at reduced dose ?-Replaced home MS Contin with OxyContin  ?-Percocet 5/325 at 3 times daily as needed for (reduced dose from home) ?-Continue Tylenol for mild pain ?-Tramadol as needed for moderate pain ? ?History of anxiety/bipolar disorder ?-Resume home Prozac and trazodone ?-Resume home Seroquel at reduced dose ? ?Elevated d-dimer ?LE Doppler negative for DVT.  CTA chest negative for PE but right perinephric  abscess, possible septic emboli and right pleural effusion. ? ?Hypokalemia ?Resolved. ? ?Dehydration ?Likely from hyperglycemia. ?-Continue IV fluids ? ? ? ?DVT prophylaxis:  ?enoxaparin (LOVENOX) injection 40 mg Start: 08/24/21 1800 ? ?Code Status: full code ?Family Communication: Patient and/or RN. Available if any question.  ?Level of care: Stepdown ?Status is: Inpatient ?Remains inpatient appropriate because: Sepsis due to renal abscess and perinephric abscess, right pleural effusion ? ? ?Final disposition: TBD ?Consultants:  ?Urology ?Infectious disease ?Interventional radiology ? ?Sch Meds:  ?Scheduled Meds: ? Chlorhexidine Gluconate Cloth  6 each Topical Daily  ? diclofenac Sodium  4 g Topical QID  ? enoxaparin (LOVENOX) injection  40 mg Subcutaneous Q24H  ? fentaNYL      ? insulin aspart  0-15 Units Subcutaneous TID WC  ? insulin aspart  0-5 Units Subcutaneous QHS  ? insulin aspart  4 Units Subcutaneous TID WC  ? insulin detemir  25 Units Subcutaneous BID  ? midazolam      ? oxyCODONE  15 mg Oral Q12H  ? pregabalin  150 mg Oral TID  ? QUEtiapine  50 mg Oral QHS  ? traZODone  150 mg Oral QHS  ? ?Continuous Infusions: ? sodium chloride Stopped (08/26/21 1147)  ? 0.9 % NaCl with KCl 20 mEq / L 100 mL/hr at 08/26/21 1345  ?  piperacillin-tazobactam (ZOSYN)  IV Stopped (08/26/21 1020)  ? ?PRN Meds:.sodium chloride, acetaminophen, dextrose, fentaNYL, FLUoxetine, hydrALAZINE, labetalol, lidocaine (PF), midazolam, nitroGLYCERIN, ondansetron (ZOFRAN) IV, oxyCODONE-acetaminophen, traMADol ? ?Antimicrobials: ?Anti-infectives (From admission, onward)  ? ? Start     Dose/Rate Route Frequency Ordered Stop  ? 08/25/21 1530  piperacillin-tazobactam (ZOSYN) IVPB 3.375 g       ? 3.375 g ?12.5 mL/hr over 240 Minutes Intravenous Every 8 hours 08/25/21 1431    ? 08/24/21 1915  azithromycin (ZITHROMAX) 500 mg in sodium chloride 0.9 % 250 mL IVPB  Status:  Discontinued       ? 500 mg ?250 mL/hr over 60 Minutes Intravenous Every 24  hours 08/24/21 1825 08/25/21 1428  ? 08/24/21 1900  cefTRIAXone (ROCEPHIN) 2 g in sodium chloride 0.9 % 100 mL IVPB  Status:  Discontinued       ? 2 g ?200 mL/hr over 30 Minutes Intravenous Daily 08/24/21 1825 08/25/21

## 2021-08-26 NOTE — Progress Notes (Signed)
1800 Lovenox dose held per IR orders. This RN will continue to carefully monitor bleeding from patient's drain.  ?

## 2021-08-26 NOTE — Progress Notes (Signed)
Inpatient Diabetes Program Recommendations ? ?AACE/ADA: New Consensus Statement on Inpatient Glycemic Control (2015) ? ?Target Ranges:  Prepandial:   less than 140 mg/dL ?     Peak postprandial:   less than 180 mg/dL (1-2 hours) ?     Critically ill patients:  140 - 180 mg/dL  ? ?Lab Results  ?Component Value Date  ? GLUCAP 129 (H) 08/26/2021  ? HGBA1C 14.6 (H) 08/18/2021  ? ? ?Review of Glycemic Control ? ?CBGs this am: 251, 129 ?Post-prandials on 4/12: 206-302 mg/dL ? ? ?Current orders for Inpatient glycemic control: Levemir 25 units BID, Novolog 0-15 units TID with meals and 0-5 HS + 4 units TID ? ?Inpatient Diabetes Program Recommendations:   ? ?Increase Novolog to 8 units TID with meals if eating > 50%. ? ?Will speak with pt later today. ? ?Thank you. ?Lorenda Peck, RD, LDN, CDE ?Inpatient Diabetes Coordinator ?[phone]  ? ? ? ? ?

## 2021-08-26 NOTE — Consult Note (Signed)
? ?Chief Complaint: ?Patient was seen in consultation today for peri-nephric at the request of Dr. Retta Dionesahlstedt. ? ?Supervising Physician: Pernell DupreEl-Abd, Yasser J ? ?Patient Status: [facility] - [facility] ? ?History of Present Illness: ?Victor Matthews is a 36 y.o. male admitted with uncontrolled DMI.  He complains of abdominal pain and on imaging is found to have a perinephric abscess of the right kidney.  Additionally, a small right-sided pleural effusion was found.  He is being treated for ESBL E.Coli as this is what he grew 2 months ago.  He has been relatively stable during hospitalization.  IR has been consulted regarding R thoracentesis and R perinephric renal abscess drain. ? ?Past Medical History:  ?Diagnosis Date  ? Diabetes mellitus without complication (HCC)   ? Neuropathy   ? ? ?Past Surgical History:  ?Procedure Laterality Date  ? HEMORRHOID SURGERY    ? ? ?Allergies: ?Patient has no known allergies. ? ?Medications: ?Prior to Admission medications   ?Medication Sig Start Date End Date Taking? Authorizing Provider  ?FLUoxetine (PROZAC) 20 MG capsule Take 20 mg by mouth daily as needed (anxiety attacks).   Yes [provider]  ?insulin aspart protamine- aspart (NOVOLOG MIX 70/30) (70-30) 100 UNIT/ML injection Inject 0.4 mLs (40 Units total) into the skin with breakfast, with lunch, and with evening meal. ?Patient taking differently: Inject 30 Units into the skin with breakfast, with lunch, and with evening meal. 08/09/21  Yes Mesner, Barbara CowerJason, MD  ?insulin detemir (LEVEMIR) 100 UNIT/ML injection Inject 0.3 mLs (30 Units total) into the skin daily. ?Patient taking differently: Inject 40 Units into the skin at bedtime. 08/09/21  Yes Mesner, Barbara CowerJason, MD  ?pregabalin (LYRICA) 300 MG capsule Take 1 capsule (300 mg total) by mouth in the morning, at noon, and at bedtime. 08/09/21 09/08/21 Yes Mesner, Barbara CowerJason, MD  ?QUEtiapine (SEROQUEL) 100 MG tablet Take 100 mg by mouth at bedtime as needed (anxiety).   Yes [provider]  ?traZODone (DESYREL) 150 MG tablet Take 1 tablet (150 mg total) by mouth at bedtime. 08/09/21  Yes Mesner, Barbara CowerJason, MD  ?  ? ?History reviewed. No pertinent family history. ? ?Social History  ? ?Socioeconomic History  ? Marital status: Single  ?  Spouse name: Not on file  ? Number of children: Not on file  ? Years of education: Not on file  ? Highest education level: Not on file  ?Occupational History  ? Not on file  ?Tobacco Use  ? Smoking status: Every Day  ?  Types: Cigarettes  ? Smokeless tobacco: Never  ?Vaping Use  ? Vaping Use: Never used  ?Substance and Sexual Activity  ? Alcohol use: Yes  ? Drug use: Never  ? Sexual activity: Not on file  ?Other Topics Concern  ? Not on file  ?Social History Narrative  ? Not on file  ? ?Social Determinants of Health  ? ?Financial Resource Strain: Not on file  ?Food Insecurity: Not on file  ?Transportation Needs: Not on file  ?Physical Activity: Not on file  ?Stress: Not on file  ?Social Connections: Not on file  ? ? ?Review of Systems: A 12 point ROS discussed and pertinent positives are indicated in the HPI above.  All other systems are negative. ? ?Review of Systems  ?Constitutional:  Positive for activity change, chills and fever.  ?Gastrointestinal:  Positive for abdominal pain.  ?Musculoskeletal:   ?     Right shoulder pain, right leg swelling  ? ?Vital Signs: ?BP (!) 146/83   Pulse  100   Temp 99.7 ?F (37.6 ?C) (Oral)   Resp (!) 22   Ht 6' (1.829 m)   Wt 216 lb 14.9 oz (98.4 kg)   SpO2 96%   BMI 29.42 kg/m?  ? ?Physical Exam ?Constitutional:   ?   General: He is not in acute distress. ?   Appearance: He is not toxic-appearing.  ?HENT:  ?   Head: Normocephalic and atraumatic.  ?   Mouth/Throat:  ?   Mouth: Mucous membranes are moist.  ?   Pharynx: Oropharynx is clear.  ?Eyes:  ?   Extraocular Movements: Extraocular movements intact.  ?   Conjunctiva/sclera: Conjunctivae normal.  ?Cardiovascular:  ?   Pulses: Normal pulses.  ?   Heart sounds: Normal heart sounds.   ?Pulmonary:  ?   Effort: Pulmonary effort is normal.  ?   Breath sounds: Normal breath sounds.  ?Abdominal:  ?   General: Abdomen is flat.  ?   Palpations: Abdomen is soft.  ?   Tenderness: There is abdominal tenderness.  ?Skin: ?   General: Skin is warm and dry.  ?Neurological:  ?   General: No focal deficit present.  ?   Mental Status: He is alert and oriented to person, place, and time.  ?Psychiatric:     ?   Mood and Affect: Mood normal.     ?   Thought Content: Thought content normal.  ? ? ?Imaging: ?CT ABDOMEN PELVIS WO CONTRAST ? ?Result Date: 08/25/2021 ?CLINICAL DATA:  Follow-up right renal abscess. EXAM: CT ABDOMEN AND PELVIS WITHOUT CONTRAST TECHNIQUE: Multidetector CT imaging of the abdomen and pelvis was performed following the standard protocol without IV contrast. RADIATION DOSE REDUCTION: This exam was performed according to the departmental dose-optimization program which includes automated exposure control, adjustment of the mA and/or kV according to patient size and/or use of iterative reconstruction technique. COMPARISON:  Chest CT, same date.  Lumbar spine MRI 08/18/2021 FINDINGS: Lower chest: Moderate-sized right pleural effusion with overlying atelectasis. The left lung is clear. The heart is normal in size. Hepatobiliary: No hepatic lesions are identified. No intrahepatic biliary dilatation. Normal caliber common bile duct. Pancreas: No mass, inflammation or ductal dilatation. Spleen: Normal size.  No focal lesions. Adrenals/Urinary Tract: Adrenal glands are normal. There is a large gas containing renal and perinephric abscess. This involves the posterior renal parenchyma extending into the perinephric space with there is fluid and gas also breaking out into the retroperitoneum surrounding the IVC. It measures approximately 8.3 x 8.0 x 4.0 cm. There is a small amount of contrast in the renal collecting systems from the prior contrast enhanced chest CT. The left kidney is unremarkable. No  bladder lesions are identified. Stomach/Bowel: The stomach, duodenum, small bowel and colon are grossly normal. Vascular/Lymphatic: The aorta is normal in caliber. No atheroscerlotic calcifications. No mesenteric of retroperitoneal mass or adenopathy. Small scattered lymph nodes are noted. Reproductive: The prostate gland and seminal vesicles are unremarkable. Other: Scattered free abdominal fluid mainly in the right pericolic gutter and in the pelvis likely associated with the renal abscess. Musculoskeletal: No significant bony findings. IMPRESSION: 1. Large gas containing right renal and perinephric abscess measuring approximately 8.3 x 8.0 x 4.0 cm. 2. Moderate-sized right pleural effusion with overlying atelectasis. 3. Scattered free abdominal fluid mainly in the right paracolic gutter and in the pelvis likely associated with the renal abscess. Electronically Signed   By: Rudie Meyer M.D.   On: 08/25/2021 16:33  ? ?DG Cervical Spine 2  or 3 views ? ?Result Date: 08/19/2021 ?CLINICAL DATA:  Neck pain EXAM: CERVICAL SPINE - 2-3 VIEW COMPARISON:  None. FINDINGS: There is no evidence of cervical spine fracture or prevertebral soft tissue swelling. Alignment is normal. No other significant bone abnormalities are identified. IMPRESSION: Negative cervical spine radiographs. Electronically Signed   By: Marlan Palau M.D.   On: 08/19/2021 17:42  ? ?DG Thoracic Spine 2 View ? ?Result Date: 08/19/2021 ?CLINICAL DATA:  Pain in cervical and thoracic spine EXAM: THORACIC SPINE 2 VIEWS COMPARISON:  Lumbar MRI in lumbar spine radiograph 08/18/2021 FINDINGS: Very mild decreased height of the T12 vertebral body is likely developmental. No evidence of fracture or edema on MRI. Remaining vertebral bodies in the thoracic spine are normal. Normal alignment. Disc spaces intact. No significant degenerative change. IMPRESSION: Negative Electronically Signed   By: Marlan Palau M.D.   On: 08/19/2021 17:38  ? ?DG Lumbar Spine 2-3  Views ? ?Result Date: 08/18/2021 ?CLINICAL DATA:  Fall last week. EXAM: LUMBAR SPINE - 2-3 VIEW COMPARISON:  None. FINDINGS: Normal alignment. Mild anterior wedging at T12 which is likely developmental. No acute lumbar fr

## 2021-08-26 NOTE — Progress Notes (Signed)
Pt brought to [place] for thoracentesis.  Upon [place] scan, not enough fluid to safely access.  Procedure not performed ? ?Electronically Signed: ?Pasty Spillers, PA-C ?08/26/2021, 5:30 PM ? ? ?

## 2021-08-26 NOTE — Procedures (Addendum)
Interventional Radiology Procedure Note ? ?Date of Procedure: 08/26/2021  ?Procedure: CT drain placement  ? ?Findings:  ?1. CT drain placement of 12 Fr locking drain into perinephric collection (hematoma vs abscess)   ? ?Complications: No immediate complications noted.  ? ?Estimated Blood Loss: minimal ? ?Follow-up and Recommendations: ?1. Monitor drain output - if >100 ml/hour, clamp drain using attached stopcock and call IR  ?2. Trend Hgb - Q6 hour CBC, concern for potential bleed with drain in perinephric hematoma  ?3. Follow up aspirate cultures  ? ? ?Albin Felling, MD  ?Vascular & Interventional Radiology  ?08/26/2021 2:30 PM ? ? ? ?

## 2021-08-26 NOTE — Assessment & Plan Note (Addendum)
Recent Labs  ?  08/24/21 ?[id number] 08/26/21 ?0300 08/26/21 ?[id number] 08/26/21 ?2035 08/27/21 ?0307 08/27/21 ?[id number] 08/27/21 ?1311 08/28/21 ?0450 08/29/21 ?0434 08/30/21 ?0434  ?HGB 12.2* 7.8* 8.5* 8.3* 7.8* 8.0* 9.2* 7.8* 8.1* 8.0*  ?Baseline Hgb 10-11.  Slight drop likely from hemodilution and some perinephric hemorrhage.  H&H relatively stable.  He denies melena or hematochezia. ?-Continue monitoring ? ?

## 2021-08-26 NOTE — Progress Notes (Signed)
Initial Nutrition Assessment ? ?DOCUMENTATION CODES:  ? ?Not applicable ? ?INTERVENTION:  ?- diet advancement as medically feasible., ?- will order Ensure Max once/day, each supplement provides 150 kcal and 30 grams of protein. ?- will order 1 tablet multivitamin with minerals/day. ?- will place Carbohydrate Counting for People with Diabetes handout from the Academy of [employer] Dietetics in AVS.  ? ? ?NUTRITION DIAGNOSIS:  ? ?Increased nutrient needs related to acute illness as evidenced by estimated needs. ? ?GOAL:  ? ?Patient will meet greater than or equal to 90% of their needs ? ?MONITOR:  ? ?Diet advancement, PO intake, Supplement acceptance, Labs, Weight trends ? ?REASON FOR ASSESSMENT:  ? ?Consult ?Assessment of nutrition requirement/status ? ?ASSESSMENT:  ? ?36 year old male with medical history of type 1 DM, peripheral neuropathy, chronic pain on chronic opiate, anxiety, BPD, E. coli bacteremia and COVID-19 infection in 06/2021, and recent hospitalization from 4/4-4/7 (left AMA) for hyperglycemia and ambulatory dysfunction. He returned to the ED with hyperglycemia and fall x2. He was admitted with uncontrolled type 1 DM, AKI, acute metabolic encephalopathy, uncontrolled hypertension, and possible PNA. In the ED, CTA chest showed R renal and perinephric abscess, moderate R pleural effusion, and possible septic emboli to RUL. CT abdomen/pelvis confirmed renal abscess. Urology consulted and IR consulted for drain placement. ? ?Patient sitting up in bed after recently returning from IR for nephrotic drain placement. No visitors present at the time of RD visit.  ? ?He has not been seen by a [place] RD at any time in the past.  ? ?Patient drowsy and dozing off throughout visit so visit more brief in light of this. ? ?Patient shares that since having COVID in February he has been sick multiple times which has made controlling blood sugars difficult. When he is not sick, typical CBGs are in the low 200s.   ? ?His appetite has been good, unchanged. ? ?He does not have an insulin pump but shares that several friends/people he knows have mentioned that he may want to look into getting one.  ? ?Weight on 4/11 was 217 lb and weight on 3/27 was 225 lb. This indicates 8 lb weight los (3.5% body weight) in the past 2 weeks; significant for time frame. ? ? ?Labs reviewed; CBGs: 251, 129, 91 mg/dl, Alk Phos elevated, Na: 131 mmol/l, creatinine: 1.74 mg/dl, Ca: 7.9 mg/dl, GFR: 52 ml/min. ? ?Medications reviewed; sliding scale novolog, 4 units novolog TID, 25 units levemir BID. ? ?IVF; NS-20 mEq KCl @ 100 ml/hr. ?  ? ?NUTRITION - FOCUSED PHYSICAL EXAM: ? ?Flowsheet Row Most Recent Value  ?Orbital Region No depletion  ?Upper Arm Region No depletion  ?Thoracic and Lumbar Region Unable to assess  ?Buccal Region No depletion  ?Temple Region No depletion  ?Clavicle Bone Region No depletion  ?Clavicle and Acromion Bone Region No depletion  ?Scapular Bone Region No depletion  ?Dorsal Hand No depletion  ?Patellar Region Unable to assess  ?Anterior Thigh Region Unable to assess  ?Posterior Calf Region Unable to assess  ?Edema (RD Assessment) Unable to assess  ?Hair Reviewed  ?Eyes Reviewed  ?Mouth Reviewed  ?Skin Reviewed  ?Nails Reviewed  ? ?  ? ? ?Diet Order:   ?Diet Order   ? ?       ?  Diet NPO time specified  Diet effective midnight       ?  ? ?  ?  ? ?  ? ? ?EDUCATION NEEDS:  ? ?Not appropriate for education at  this time ? ?Skin:  Skin Assessment: Reviewed RN Assessment ? ?Last BM:  PTA/unknown ? ?Height:  ? ?Ht Readings from Last 1 Encounters:  ?08/24/21 6' (1.829 m)  ? ? ?Weight:  ? ?Wt Readings from Last 1 Encounters:  ?08/24/21 98.4 kg  ? ? ?BMI:  Body mass index is 29.42 kg/m?. ? ?Estimated Nutritional Needs:  ?Kcal:  2300-2500 kcal ?Protein:  115-130 grams ?Fluid:  >/= 2.5 L/day ? ? ? ? ?Jarome Matin, MS, RD, LDN ?Registered Dietitian II ?Inpatient Clinical Nutrition ?RD pager # and on-call/weekend pager # available in  [place]  ? ?

## 2021-08-27 LAB — CBC WITH DIFFERENTIAL/PLATELET
Abs Immature Granulocytes: 0.05 10*3/uL (ref 0.00–0.07)
Abs Immature Granulocytes: 0.04 10*3/uL (ref 0.00–0.07)
Abs Immature Granulocytes: 0.05 10*3/uL (ref 0.00–0.07)
Basophils Absolute: 0 10*3/uL (ref 0.0–0.1)
Basophils Absolute: 0 10*3/uL (ref 0.0–0.1)
Basophils Absolute: 0 10*3/uL (ref 0.0–0.1)
Basophils Relative: 0 %
Basophils Relative: 0 %
Basophils Relative: 0 %
Eosinophils Absolute: 0.2 10*3/uL (ref 0.0–0.5)
Eosinophils Absolute: 0.1 10*3/uL (ref 0.0–0.5)
Eosinophils Absolute: 0.2 10*3/uL (ref 0.0–0.5)
Eosinophils Relative: 2 %
Eosinophils Relative: 2 %
Eosinophils Relative: 2 %
HCT: 23.7 % — ABNORMAL LOW (ref 39.0–52.0)
HCT: 24 % — ABNORMAL LOW (ref 39.0–52.0)
HCT: 27.1 % — ABNORMAL LOW (ref 39.0–52.0)
Hemoglobin: 7.8 g/dL — ABNORMAL LOW (ref 13.0–17.0)
Hemoglobin: 8 g/dL — ABNORMAL LOW (ref 13.0–17.0)
Hemoglobin: 9.2 g/dL — ABNORMAL LOW (ref 13.0–17.0)
Immature Granulocytes: 1 %
Immature Granulocytes: 1 %
Immature Granulocytes: 1 %
Lymphocytes Relative: 13 %
Lymphocytes Relative: 13 %
Lymphocytes Relative: 11 %
Lymphs Abs: 1 10*3/uL (ref 0.7–4.0)
Lymphs Abs: 0.8 10*3/uL (ref 0.7–4.0)
Lymphs Abs: 0.9 10*3/uL (ref 0.7–4.0)
MCH: 26.8 pg (ref 26.0–34.0)
MCH: 27 pg (ref 26.0–34.0)
MCH: 27.5 pg (ref 26.0–34.0)
MCHC: 32.9 g/dL (ref 30.0–36.0)
MCHC: 33.3 g/dL (ref 30.0–36.0)
MCHC: 33.9 g/dL (ref 30.0–36.0)
MCV: 81.4 fL (ref 80.0–100.0)
MCV: 81.1 fL (ref 80.0–100.0)
MCV: 80.9 fL (ref 80.0–100.0)
Monocytes Absolute: 0.7 10*3/uL (ref 0.1–1.0)
Monocytes Absolute: 0.6 10*3/uL (ref 0.1–1.0)
Monocytes Absolute: 0.7 10*3/uL (ref 0.1–1.0)
Monocytes Relative: 10 %
Monocytes Relative: 9 %
Monocytes Relative: 9 %
Neutro Abs: 5.6 10*3/uL (ref 1.7–7.7)
Neutro Abs: 5.1 10*3/uL (ref 1.7–7.7)
Neutro Abs: 6 10*3/uL (ref 1.7–7.7)
Neutrophils Relative %: 74 %
Neutrophils Relative %: 75 %
Neutrophils Relative %: 77 %
Platelets: 221 10*3/uL (ref 150–400)
Platelets: 212 10*3/uL (ref 150–400)
Platelets: 271 10*3/uL (ref 150–400)
RBC: 2.91 MIL/uL — ABNORMAL LOW (ref 4.22–5.81)
RBC: 2.96 MIL/uL — ABNORMAL LOW (ref 4.22–5.81)
RBC: 3.35 MIL/uL — ABNORMAL LOW (ref 4.22–5.81)
RDW: 13.8 % (ref 11.5–15.5)
RDW: 14 % (ref 11.5–15.5)
RDW: 13.9 % (ref 11.5–15.5)
WBC: 7.6 10*3/uL (ref 4.0–10.5)
WBC: 6.7 10*3/uL (ref 4.0–10.5)
WBC: 7.7 10*3/uL (ref 4.0–10.5)
nRBC: 0 % (ref 0.0–0.2)
nRBC: 0 % (ref 0.0–0.2)
nRBC: 0 % (ref 0.0–0.2)

## 2021-08-27 LAB — RENAL FUNCTION PANEL
Albumin: 2.2 g/dL — ABNORMAL LOW (ref 3.5–5.0)
Anion gap: 8 (ref 5–15)
BUN: 14 mg/dL (ref 6–20)
CO2: 23 mmol/L (ref 22–32)
Calcium: 8.3 mg/dL — ABNORMAL LOW (ref 8.9–10.3)
Chloride: 102 mmol/L (ref 98–111)
Creatinine, Ser: 1.72 mg/dL — ABNORMAL HIGH (ref 0.61–1.24)
GFR, Estimated: 53 mL/min — ABNORMAL LOW (ref >60)
Glucose, Bld: 179 mg/dL — ABNORMAL HIGH (ref 70–99)
Phosphorus: 4.3 mg/dL (ref 2.5–4.6)
Potassium: 4.4 mmol/L (ref 3.5–5.1)
Sodium: 133 mmol/L — ABNORMAL LOW (ref 135–145)

## 2021-08-27 LAB — GLUCOSE, CAPILLARY
Glucose-Capillary: 182 mg/dL — ABNORMAL HIGH (ref 70–99)
Glucose-Capillary: 204 mg/dL — ABNORMAL HIGH (ref 70–99)
Glucose-Capillary: 169 mg/dL — ABNORMAL HIGH (ref 70–99)
Glucose-Capillary: 309 mg/dL — ABNORMAL HIGH (ref 70–99)
Glucose-Capillary: 234 mg/dL — ABNORMAL HIGH (ref 70–99)

## 2021-08-27 LAB — MAGNESIUM: Magnesium: 2.1 mg/dL (ref 1.7–2.4)

## 2021-08-27 MED ORDER — QUETIAPINE FUMARATE 50 MG PO TABS
100.0000 mg | ORAL_TABLET | Freq: Every day | ORAL | Status: DC
  Administered 2021-08-27 – 2021-09-01 (×6): 100 mg via ORAL
  Filled 2021-08-27 (×6): qty 2

## 2021-08-27 NOTE — Progress Notes (Signed)
?PROGRESS NOTE ? ?Victor Matthews XBJ:[id number] DOB: 07-18-1985  ? ?PCP: System, Provider Not In ? ?Patient is from: Home.  [place]  Lives with his godfather. ? ?DOA: 08/24/2021 LOS: 3 ? ?Chief complaints ?Chief Complaint  ?Patient presents with  ? Hyperglycemia  ?  ? ?Brief Narrative / Interim history: ?36 year old M with PMH of DM-1 with peripheral neuropathy, chronic pain on chronic opiate, anxiety, BPD, E. coli bacteremia and COVID-19 infection in 06/2021, and recent hospitalization from 4/4-4/7 for hyperglycemia and ambulatory dysfunction when he left AMA [facility] with hyperglycemia and falls x2, and admitted for hyperglycemia to 715, AKI, acute metabolic encephalopathy, possible pneumonia and uncontrolled hypertension.  He was started on insulin drip, IV ceftriaxone and IV fluid.  He also had elevated D-dimer in ED. CTA chest ordered to rule out PE and showed right renal and perinephric abscess, moderate right pleural effusion and possible septic emboli to RUL but negative for PE.  Antibiotics broadened to IV Zosyn.  Urology consulted.  CT abdomen and pelvis obtained confirmed renal abscess.  IR consulted, and drain placed with bloody output.  Abscess culture with GNR.  ? ?Subjective: ?Seen and examined earlier this morning.  No major events overnight of this morning.  Feels sore from the drain site.  Feels hungry.  Likes to eat.  Otherwise, no complaints.  Denies chest pain, dyspnea, nausea or vomiting.  About 130 cc serosanguineous output from drain. ? ?Objective: ?Vitals:  ? 08/27/21 0400 08/27/21 0752 08/27/21 1143 08/27/21 1427  ?BP: 132/84   (!) 146/79  ?Pulse: 93   99  ?Resp: 14     ?Temp:  98.7 ?F (37.1 ?C) 97.9 ?F (36.6 ?C) 98.3 ?F (36.8 ?C)  ?TempSrc:  Oral Oral Oral  ?SpO2: 93%   98%  ?Weight:      ?Height:      ? ? ?Examination: ? ?GENERAL: No apparent distress.  Nontoxic.  Sitting on bedside chair. ?HEENT: MMM.  Vision and hearing grossly intact.   ?NECK: Supple.  No apparent JVD.  ?RESP:  No IWOB.  Fair aeration bilaterally. ?CVS:  RRR. Heart sounds normal.  ?ABD/GI/GU: BS+. Abd soft, NTND.  Drain to right back.  Serosanguineous output ?MSK/EXT:  Moves extremities. No apparent deformity. No edema.  ?SKIN: no apparent skin lesion or wound ?NEURO: Awake and alert. Oriented appropriately.  No apparent focal neuro deficit. ?PSYCH: Calm. Normal affect.  ? ?Procedures:  ?4/13-IR placed drain for perinephric abscess ? ?Microbiology summarized: ?MRSA PCR screen negative. ?Blood cultures NGTD. ?Perinephric abscess culture with abundant GNR. ? ?Assessment and Plan: ?* Uncontrolled type 1 diabetes mellitus with hyperglycemia, with long-term current use of insulin (HCC) ?Suspect poor compliance contributing to this.  Does not meet criteria for HHS or DKA.  On Levemir 30 units daily and NovoLog 70/30 at 40 units 3 times daily at home?  A1c 14.6% ?Recent Labs  ?Lab 08/26/21 ?2137 08/26/21 ?2341 08/27/21 ?[id number] 08/27/21 ?[id number] 08/27/21 ?1138  ?GLUCAP 175* 171* 182* 204* 169*  ?-Continue SSI-moderate. ?-Continue NovoLog 4 units 3 times daily with meals ?-Continue Levemir 25 units twice daily ?-Need to adjust home insulin on discharge ?-Appreciate input by diabetic coordinator. ? ? ?Sepsis due to right perinephric abscess ?CTA C/A/P showed right renal and perinephric abscess with pneumoperitoneum, moderate right pleural effusion and possible septic emboli to RUL.  Of note, patient had an MRI of L-spine on 4/6 that showed heterogeneous, partially cystic lesion of right kidney.  He was also  hospitalized and treated for E. coli bacteremia 2 months ago.   Blood cultures NGTD but obtained after IV ceftriaxone.  MRSA PCR screen negative.  ?-S/p IR drain placement with bloody output.  Fluid culture with GNR. ?-Drain output serosanguineous.  About 130 cc documented over the last 24 hours ?-Continue IV Zosyn pending culture speciation and sensitivity ?-Appreciate input by  urology ? ? ?Possible RUL septic emboli ?From renal abscess?  Blood cultures NGTD. ?-Antibiotics as above ? ?Pleural effusion on right ?Felt to be reactive secondary to renal abscess. IR thoracocentesis ordered but pleural effusion felt to be small on ultrasound. ? ?Acute metabolic encephalopathy ?Resolved.  Some concern about polypharmacy. ?-Minimize sedating medications. ? ?Atypical chest pain ?Serial troponin and EKG negative.  Likely due to renal abscess and pleural effusion. ?-Management as above ? ? ?Fall due to ambulatory dysfunction ?Ongoing since January 2023.  Unclear etiology but patient is at risk for falls due to diabetic neuropathy and polypharmacy.  Multiple imaging including MRI brain, C, T and L spines, and CT head and cervical spine without significant finding to explain his symptoms.  Neuro exam nonfocal.  Some diffuse tenderness on the right from the fall. ?-PT/OT eval ?-Fall precaution ? ?Normocytic anemia ?Recent Labs  ?  08/18/21 ?0243 08/20/21 ?1450 08/24/21 ?7619 08/24/21 ?5093 08/26/21 ?0300 08/26/21 ?2671 08/26/21 ?2035 08/27/21 ?0307 08/27/21 ?2458 08/27/21 ?1311  ?HGB 10.9* 10.8* 10.7* 12.2* 7.8* 8.5* 8.3* 7.8* 8.0* 9.2*  ?Baseline Hgb 10-11.  Slight drop likely from hemodilution and some perinephric hemorrhage.  H&H relatively stable.  He denies melena or hematochezia. ?-Continue monitoring ? ? ?Elevated serum creatinine ?Recent Labs  ?  08/19/21 ?0433 08/20/21 ?1450 08/24/21 ?0998 08/24/21 ?1454 08/24/21 ?1919 08/24/21 ?2240 08/25/21 ?0304 08/25/21 ?3382 08/26/21 ?0300 08/27/21 ?5053  ?BUN 14 16 23* 19 16 15 14 14 15 14   ?CREATININE 1.26* 1.53* 2.16* 1.72* 1.47* 1.48* 1.45* 1.59* 1.74* 1.72*  ?Creatinine relatively stable.  AKI ruled out. ?-Continue IV fluid ?-Avoid nephrotoxic meds ? ? ?Chronic pain syndrome ?On  MS Contin, Percocet and Lyrica per narcotic database.  Last fill on 07/12/2021.  Reportedly could not fill last month due to illness and hospitalization. ?-Resume home Lyrica  at reduced dose ?-Replaced home MS Contin with OxyContin  ?-Percocet 5/325 at 3 times daily as needed for (reduced dose from home) ?-Continue Tylenol for mild pain ?-Tramadol as needed for moderate pain ? ?History of anxiety/bipolar disorder ?-Continue home Prozac, trazodone and Seroquel. ? ?Elevated d-dimer ?LE Doppler negative for DVT.  CTA chest negative for PE but right perinephric abscess, possible septic emboli and right pleural effusion. ? ?Hypokalemia ?Resolved. ? ?Dehydration ?Likely from hyperglycemia. ?-Continue IV fluids ? ? ? ?DVT prophylaxis:  ?Place and maintain sequential compression device Start: 08/26/21 1753 ? ?Code Status: full code ?Family Communication: Patient and/or RN. Available if any question.  ?Level of care: Med-Surg ?Status is: Inpatient ?Remains inpatient appropriate because: Sepsis due to renal abscess and perinephric abscess, right pleural effusion ? ? ?Final disposition: TBD ?Consultants:  ?Urology ?Infectious disease ?Interventional radiology ? ?Sch Meds:  ?Scheduled Meds: ? Chlorhexidine Gluconate Cloth  6 each Topical Daily  ? diclofenac Sodium  4 g Topical QID  ? insulin aspart  0-15 Units Subcutaneous TID WC  ? insulin aspart  0-5 Units Subcutaneous QHS  ? insulin aspart  4 Units Subcutaneous TID WC  ? insulin detemir  25 Units Subcutaneous BID  ? multivitamin with minerals  1 tablet Oral Daily  ? oxyCODONE  15  mg Oral Q12H  ? pregabalin  150 mg Oral TID  ? Ensure Max Protein  11 oz Oral Daily  ? QUEtiapine  100 mg Oral QHS  ? traZODone  150 mg Oral QHS  ? ?Continuous Infusions: ? sodium chloride 10 mL/hr at 08/27/21 1200  ? 0.9 % NaCl with KCl 20 mEq / L 100 mL/hr at 08/27/21 1200  ? piperacillin-tazobactam (ZOSYN)  IV 3.375 g (08/27/21 1324)  ? ?PRN Meds:.sodium chloride, acetaminophen, dextrose, FLUoxetine, hydrALAZINE, labetalol, nitroGLYCERIN, ondansetron (ZOFRAN) IV, oxyCODONE-acetaminophen, traMADol ? ?Antimicrobials: ?Anti-infectives (From admission, onward)  ? ? Start      Dose/Rate Route Frequency Ordered Stop  ? 08/25/21 1530  piperacillin-tazobactam (ZOSYN) IVPB 3.375 g       ? 3.375 g ?12.5 mL/hr over 240 Minutes Intravenous Every 8 hours 08/25/21 1431    ? 08/24/21 1915  azithr

## 2021-08-27 NOTE — Plan of Care (Cosign Needed)
Completed during shift assessment ?

## 2021-08-27 NOTE — Progress Notes (Signed)
?Subjective: ?Patient is complaining of right flank soreness.  H/H stable following drain placement  ? ?Objective: ?Vital signs in last 24 hours: ?Temp:  [97.9 ?F (36.6 ?C)-100.2 ?F (37.9 ?C)] 98.3 ?F (36.8 ?C) (04/14 1427) ?Pulse Rate:  [93-105] 99 (04/14 1427) ?Resp:  [14-24] 14 (04/14 0400) ?BP: (127-147)/(70-99) 146/79 (04/14 1427) ?SpO2:  [91 %-98 %] 98 % (04/14 1427) ? ?Intake/Output from previous day: ?04/13 0701 - 04/14 0700 ?In: 2375.9 [I.V.:2236.4; IV Piggyback:139.5] ?Out: 4530 [Urine:4400; Drains:130] ? ?Intake/Output this shift: ?Total I/O ?In: 1014.7 [I.V.:962.3; IV Piggyback:52.4] ?Out: 1130 [Urine:1100; Drains:30] ? ?Physical Exam:  ?General: Alert and oriented ?GU:  Right flank drain in place and draining bloody/purulent fluid.  ? ?Lab Results: ?Recent Labs  ?  08/27/21 ?0307 08/27/21 ?[id number] 08/27/21 ?1311  ?HGB 7.8* 8.0* 9.2*  ?HCT 23.7* 24.0* 27.1*  ? ?BMET ?Recent Labs  ?  08/26/21 ?0300 08/27/21 ?0307  ?NA 131* 133*  ?K 4.2 4.4  ?CL 101 102  ?CO2 22 23  ?GLUCOSE 257* 179*  ?BUN 15 14  ?CREATININE 1.74* 1.72*  ?CALCIUM 7.9* 8.3*  ? ? ? ?Studies/Results: ?CT ABDOMEN PELVIS WO CONTRAST ? ?Result Date: 08/25/2021 ?CLINICAL DATA:  Follow-up right renal abscess. EXAM: CT ABDOMEN AND PELVIS WITHOUT CONTRAST TECHNIQUE: Multidetector CT imaging of the abdomen and pelvis was performed following the standard protocol without IV contrast. RADIATION DOSE REDUCTION: This exam was performed according to the departmental dose-optimization program which includes automated exposure control, adjustment of the mA and/or kV according to patient size and/or use of iterative reconstruction technique. COMPARISON:  Chest CT, same date.  Lumbar spine MRI 08/18/2021 FINDINGS: Lower chest: Moderate-sized right pleural effusion with overlying atelectasis. The left lung is clear. The heart is normal in size. Hepatobiliary: No hepatic lesions are identified. No intrahepatic biliary dilatation. Normal caliber common bile duct.  Pancreas: No mass, inflammation or ductal dilatation. Spleen: Normal size.  No focal lesions. Adrenals/Urinary Tract: Adrenal glands are normal. There is a large gas containing renal and perinephric abscess. This involves the posterior renal parenchyma extending into the perinephric space with there is fluid and gas also breaking out into the retroperitoneum surrounding the IVC. It measures approximately 8.3 x 8.0 x 4.0 cm. There is a small amount of contrast in the renal collecting systems from the prior contrast enhanced chest CT. The left kidney is unremarkable. No bladder lesions are identified. Stomach/Bowel: The stomach, duodenum, small bowel and colon are grossly normal. Vascular/Lymphatic: The aorta is normal in caliber. No atheroscerlotic calcifications. No mesenteric of retroperitoneal mass or adenopathy. Small scattered lymph nodes are noted. Reproductive: The prostate gland and seminal vesicles are unremarkable. Other: Scattered free abdominal fluid mainly in the right pericolic gutter and in the pelvis likely associated with the renal abscess. Musculoskeletal: No significant bony findings. IMPRESSION: 1. Large gas containing right renal and perinephric abscess measuring approximately 8.3 x 8.0 x 4.0 cm. 2. Moderate-sized right pleural effusion with overlying atelectasis. 3. Scattered free abdominal fluid mainly in the right paracolic gutter and in the pelvis likely associated with the renal abscess. Electronically Signed   By: Rudie MeyerP.  Gallerani M.D.   On: 08/25/2021 16:33  ? ?US CHEST (PLEURAL EFFUSION) ? ?Result Date: 08/26/2021 ?CLINICAL DATA:  Pleural effusion EXAM: CHEST ULTRASOUND COMPARISON:  None. FINDINGS: Limited ultrasound of the chest was performed for evaluation of pleural fluid. Images demonstrate small volume right pleural effusion, not amenable for safe image guided thoracentesis. No intervention performed. IMPRESSION: Small volume right pleural effusion.  No intervention performed.  Electronically Signed   By: Olive Bass M.D.   On: 08/26/2021 17:29  ? ?CT IMAGE GUIDED DRAINAGE BY PERCUTANEOUS CATHETER ? ?Result Date: 08/26/2021 ?INDICATION: Concern for right perinephric abscess EXAM: CT GUIDED DRAINAGE OF right perinephric ABSCESS TECHNIQUE: Multidetector CT imaging of the abdomen was performed following the standard protocol without IV contrast. RADIATION DOSE REDUCTION: This exam was performed according to the departmental dose-optimization program which includes automated exposure control, adjustment of the mA and/or kV according to patient size and/or use of iterative reconstruction technique. MEDICATIONS: The patient is currently admitted to the hospital and receiving intravenous antibiotics. The antibiotics were administered within an appropriate time frame prior to the initiation of the procedure. ANESTHESIA/SEDATION: Moderate (conscious) sedation was employed during this procedure. A total of Versed 2 mg and Fentanyl 100 mcg was administered intravenously by the radiology nurse. Total intra-service moderate Sedation Time: 42 minutes. The patient's level of consciousness and vital signs were monitored continuously by radiology nursing throughout the procedure under my direct supervision. COMPLICATIONS: None immediate. TECHNIQUE: Informed written consent was obtained from the patient after a thorough discussion of the procedural risks, benefits and alternatives. All questions were addressed. Maximal Sterile Barrier Technique was utilized including caps, mask, sterile gowns, sterile gloves, sterile drape, hand hygiene and skin antiseptic. A timeout was performed prior to the initiation of the procedure. PROCEDURE: The patient was placed prone on the exam table. Limited CT of the abdomen was performed for planning purposes. This again demonstrated a complex air and fluid collection in the right posterosuperior perinephric space. Appropriate skin entry site was marked, the overlying skin  was and draped in the standard sterile fashion. Local analgesia was obtained with 1% lidocaine. Using intermittent CT fluoroscopy, a 19 gauge Yueh catheter was advanced into the target perinephric collection. Upon needle access in the perinephric collection, there was returned of scant hemorrhagic fluid. This raise suspicion for hematoma, but given complex air and fluid appearance on CT, the decision was made to proceed with drain placement for high suspicion of abscess. Wire was easily advanced and confirmed within the perinephric collection. A 12 French dilator was then advanced over the wire. As the dilator was removed, there was more rapid return of blood. At this point, the dilator was replaced to obtain temporary hemostasis. Due to concern for hematoma that may now be rebleeding due to violation of the renal capsule, I consulted the urologist during the procedure. After speaking with the urologist on the phone, the decision was made to continue with drain placement due to high suspicious for abscess and lack of alternative treatment options. We discussed the possibility of the need for further intervention to control bleeding. Using the maintained wire access, a 12 [place] multipurpose locking drainage catheter was easily positioned within the perinephric collection. There was additional return of hemorrhagic material. A sample was sent to the lab for analysis. The bag was attached to bag drainage. It was secured to skin using silk suture and a dressing. The patient tolerated the procedure well without immediate complication. FINDINGS: See above. IMPRESSION: Successful CT-guided placement of a 12 French drainage catheter within the right perinephric collection. There was return of hemorrhagic material, suspicious for perinephric hematoma. Drain placed after intra-procedure discussion with urology. Sample sent to the lab for microbiology analysis. Instructions provided in the EMR for tube management regarding  thresholds for output and serial hemoglobin monitoring. Electronically Signed   By: Olive Bass M.D.   On: 08/26/2021 17:21   ? ?Assessment/Plan: ?36  year old male with large right perinephric abscess.  Hx of

## 2021-08-27 NOTE — Progress Notes (Signed)
? ? ?Referring Physician(s): ?Dahlstedt,S ? ?Supervising Physician: Michaelle Birks ? ?Patient Status:  [facility] - In-pt ? ?Chief Complaint: ? ?Right upper abdominal pain ? ?Subjective: ?Patient continues to have some right upper quadrant abdominal/flank discomfort, slightly more pronounced with deep breathing; currently denies fever, N/V ? ? ?Allergies: ?Patient has no known allergies. ? ?Medications: ?Prior to Admission medications   ?Medication Sig Start Date End Date Taking? Authorizing Provider  ?FLUoxetine (PROZAC) 20 MG capsule Take 20 mg by mouth daily as needed (anxiety attacks).   Yes [provider]  ?insulin aspart protamine- aspart (NOVOLOG MIX 70/30) (70-30) 100 UNIT/ML injection Inject 0.4 mLs (40 Units total) into the skin with breakfast, with lunch, and with evening meal. ?Patient taking differently: Inject 30 Units into the skin with breakfast, with lunch, and with evening meal. 08/09/21  Yes Mesner, Corene Cornea, MD  ?insulin detemir (LEVEMIR) 100 UNIT/ML injection Inject 0.3 mLs (30 Units total) into the skin daily. ?Patient taking differently: Inject 40 Units into the skin at bedtime. 08/09/21  Yes Mesner, Corene Cornea, MD  ?pregabalin (LYRICA) 300 MG capsule Take 1 capsule (300 mg total) by mouth in the morning, at noon, and at bedtime. 08/09/21 09/08/21 Yes Mesner, Corene Cornea, MD  ?QUEtiapine (SEROQUEL) 100 MG tablet Take 100 mg by mouth at bedtime as needed (anxiety).   Yes [provider]  ?traZODone (DESYREL) 150 MG tablet Take 1 tablet (150 mg total) by mouth at bedtime. 08/09/21  Yes Mesner, Corene Cornea, MD  ? ? ? ?Vital Signs: ?BP 132/84 (BP Location: Right Arm)   Pulse 93   Temp 97.9 ?F (36.6 ?C) (Oral)   Resp 14   Ht 6' (1.829 m)   Wt 216 lb 14.9 oz (98.4 kg)   SpO2 93%   BMI 29.42 kg/m?  ? ?Physical Exam :awake, alert.  Right upper quadrant abd drain intact, insertion site okay,output 130 cc yesterday, 30 cc today bloody fluid ? ?Imaging: ?CT ABDOMEN PELVIS WO CONTRAST ? ?Result Date:  08/25/2021 ?CLINICAL DATA:  Follow-up right renal abscess. EXAM: CT ABDOMEN AND PELVIS WITHOUT CONTRAST TECHNIQUE: Multidetector CT imaging of the abdomen and pelvis was performed following the standard protocol without IV contrast. RADIATION DOSE REDUCTION: This exam was performed according to the departmental dose-optimization program which includes automated exposure control, adjustment of the mA and/or kV according to patient size and/or use of iterative reconstruction technique. COMPARISON:  Chest CT, same date.  Lumbar spine MRI 08/18/2021 FINDINGS: Lower chest: Moderate-sized right pleural effusion with overlying atelectasis. The left lung is clear. The heart is normal in size. Hepatobiliary: No hepatic lesions are identified. No intrahepatic biliary dilatation. Normal caliber common bile duct. Pancreas: No mass, inflammation or ductal dilatation. Spleen: Normal size.  No focal lesions. Adrenals/Urinary Tract: Adrenal glands are normal. There is a large gas containing renal and perinephric abscess. This involves the posterior renal parenchyma extending into the perinephric space with there is fluid and gas also breaking out into the retroperitoneum surrounding the IVC. It measures approximately 8.3 x 8.0 x 4.0 cm. There is a small amount of contrast in the renal collecting systems from the prior contrast enhanced chest CT. The left kidney is unremarkable. No bladder lesions are identified. Stomach/Bowel: The stomach, duodenum, small bowel and colon are grossly normal. Vascular/Lymphatic: The aorta is normal in caliber. No atheroscerlotic calcifications. No mesenteric of retroperitoneal mass or adenopathy. Small scattered lymph nodes are noted. Reproductive: The prostate gland and seminal vesicles are unremarkable. Other: Scattered free abdominal fluid mainly in the right pericolic  gutter and in the pelvis likely associated with the renal abscess. Musculoskeletal: No significant bony findings. IMPRESSION: 1.  Large gas containing right renal and perinephric abscess measuring approximately 8.3 x 8.0 x 4.0 cm. 2. Moderate-sized right pleural effusion with overlying atelectasis. 3. Scattered free abdominal fluid mainly in the right paracolic gutter and in the pelvis likely associated with the renal abscess. Electronically Signed   By: Marijo Sanes M.D.   On: 08/25/2021 16:33  ? ?CT Angio Chest Pulmonary Embolism (PE) W or WO Contrast ? ?Result Date: 08/25/2021 ?CLINICAL DATA:  36 year old male with generalized weakness, positive D-dimer, concern for pulmonary embolism. EXAM: CT ANGIOGRAPHY CHEST WITH CONTRAST TECHNIQUE: Multidetector CT imaging of the chest was performed using the standard protocol during bolus administration of intravenous contrast. Multiplanar CT image reconstructions and MIPs were obtained to evaluate the vascular anatomy. RADIATION DOSE REDUCTION: This exam was performed according to the departmental dose-optimization program which includes automated exposure control, adjustment of the mA and/or kV according to patient size and/or use of iterative reconstruction technique. CONTRAST:  Eighty mL Omnipaque 350, intravenous COMPARISON:  None. FINDINGS: Cardiovascular: Satisfactory opacification of the pulmonary arteries to the segmental level. No evidence of pulmonary embolism. Moderate global cardiomegaly. No pericardial effusion. Mediastinum/Nodes: No enlarged mediastinal, hilar, or axillary lymph nodes. Thyroid gland, trachea, and esophagus demonstrate no significant findings. Lungs/Pleura: Moderate right pleural effusion with associated right basilar passive atelectasis. Rounded consolidative opacity in the perifissural posterior left upper lobe measuring approximately 1.6 x 1.4 cm. Upper Abdomen: Partially visualized right subcapsular versus perinephric gas containing abscess. There are few foci of gas tracking superiorly into the right suprarenal and periaortic spaces. The remaining visualized upper  abdomen is within normal limits. Musculoskeletal: No chest wall abnormality. No acute or significant osseous findings. Review of the MIP images confirms the above findings. IMPRESSION: Vascular: 1. No acute pulmonary embolism. 2. Moderate global cardiomegaly. Non-Vascular: 1. Gas containing right perinephric abscess, partially visualized. There is associated trace right pneumoretroperitoneum. 2. Moderate right pleural effusion with associated right basilar passive atelectasis. This is likely reactive secondary to renal abscess. 3. Rounded opacity in the left upper lobe measuring up to 1.6 cm, favored represent septic emboli over neoplasm. Recommend dedicated CT abdomen pelvis with contrast for further characterization and Urology consultation. These results were relayed by secure EMR chat at the time of interpretation on 08/25/2021 at 2:25 pm to provider TAYE GONFA . Ruthann Cancer, MD [facility] Radiology Electronically Signed   By: Ruthann Cancer M.D.   On: 08/25/2021 14:26  ? ?[place] CHEST ([place]) ? ?Result Date: 08/26/2021 ?CLINICAL DATA:  Pleural effusion EXAM: CHEST ULTRASOUND COMPARISON:  None. FINDINGS: Limited ultrasound of the chest was performed for evaluation of pleural fluid. Images demonstrate small volume right pleural effusion, not amenable for safe image guided thoracentesis. No intervention performed. IMPRESSION: Small volume right pleural effusion.  No intervention performed. Electronically Signed   By: Albin Felling M.D.   On: 08/26/2021 17:29  ? ?US RENAL ? ?Result Date: 08/24/2021 ?CLINICAL DATA:  Acute kidney injury. Evaluate right renal cyst seen on lumbar spine MRI. EXAM: RENAL / URINARY TRACT ULTRASOUND COMPLETE COMPARISON:  Lumbar spine MRI 08/18/2021 FINDINGS: Right Kidney: Renal measurements: 11.3 x 5.3 x 6.0 cm. = volume: 188 mL. Right kidney is not well visualized. There is a poorly defined hypoechoic structure in the right kidney  that measures 2.4 x 2.1 x 2.4 cm and not compatible with a simple cyst. Renal pelvis is  likely prominent. No definite caliceal dilatation. Left Kidney: Renal measurements: 9.9 x 6.2 x 4.4 cm = volume: 139 mL. Left kidney is

## 2021-08-28 DIAGNOSIS — E1065 Type 1 diabetes mellitus with hyperglycemia: Secondary | ICD-10-CM | POA: Diagnosis not present

## 2021-08-28 DIAGNOSIS — E871 Hypo-osmolality and hyponatremia: Secondary | ICD-10-CM

## 2021-08-28 LAB — RENAL FUNCTION PANEL
Albumin: 2.2 g/dL — ABNORMAL LOW (ref 3.5–5.0)
Anion gap: 11 (ref 5–15)
BUN: 17 mg/dL (ref 6–20)
CO2: 24 mmol/L (ref 22–32)
Calcium: 8.7 mg/dL — ABNORMAL LOW (ref 8.9–10.3)
Chloride: 98 mmol/L (ref 98–111)
Creatinine, Ser: 1.69 mg/dL — ABNORMAL HIGH (ref 0.61–1.24)
GFR, Estimated: 54 mL/min — ABNORMAL LOW (ref >60)
Glucose, Bld: 216 mg/dL — ABNORMAL HIGH (ref 70–99)
Phosphorus: 4 mg/dL (ref 2.5–4.6)
Potassium: 4.3 mmol/L (ref 3.5–5.1)
Sodium: 133 mmol/L — ABNORMAL LOW (ref 135–145)

## 2021-08-28 LAB — CBC
HCT: 23.1 % — ABNORMAL LOW (ref 39.0–52.0)
Hemoglobin: 7.8 g/dL — ABNORMAL LOW (ref 13.0–17.0)
MCH: 26.9 pg (ref 26.0–34.0)
MCHC: 33.8 g/dL (ref 30.0–36.0)
MCV: 79.7 fL — ABNORMAL LOW (ref 80.0–100.0)
Platelets: 278 10*3/uL (ref 150–400)
RBC: 2.9 MIL/uL — ABNORMAL LOW (ref 4.22–5.81)
RDW: 13.8 % (ref 11.5–15.5)
WBC: 6.7 10*3/uL (ref 4.0–10.5)
nRBC: 0 % (ref 0.0–0.2)

## 2021-08-28 LAB — GLUCOSE, CAPILLARY
Glucose-Capillary: 213 mg/dL — ABNORMAL HIGH (ref 70–99)
Glucose-Capillary: 157 mg/dL — ABNORMAL HIGH (ref 70–99)
Glucose-Capillary: 262 mg/dL — ABNORMAL HIGH (ref 70–99)
Glucose-Capillary: 293 mg/dL — ABNORMAL HIGH (ref 70–99)

## 2021-08-28 LAB — MAGNESIUM: Magnesium: 2.2 mg/dL (ref 1.7–2.4)

## 2021-08-28 MED ORDER — INSULIN ASPART 100 UNIT/ML IJ SOLN
6.0000 [IU] | Freq: Three times a day (TID) | INTRAMUSCULAR | Status: DC
  Administered 2021-08-28 – 2021-08-30 (×8): 6 [IU] via SUBCUTANEOUS

## 2021-08-28 MED ORDER — HYDROXYZINE HCL 25 MG PO TABS
25.0000 mg | ORAL_TABLET | Freq: Four times a day (QID) | ORAL | Status: DC | PRN
  Administered 2021-08-28 – 2021-09-02 (×4): 25 mg via ORAL
  Filled 2021-08-28 (×5): qty 1

## 2021-08-28 MED ORDER — CLONAZEPAM 0.5 MG PO TABS
0.5000 mg | ORAL_TABLET | Freq: Two times a day (BID) | ORAL | Status: DC
  Administered 2021-08-28 – 2021-09-02 (×10): 0.5 mg via ORAL
  Filled 2021-08-28 (×10): qty 1

## 2021-08-28 MED ORDER — OXYCODONE-ACETAMINOPHEN 5-325 MG PO TABS
1.0000 | ORAL_TABLET | Freq: Four times a day (QID) | ORAL | Status: DC | PRN
  Administered 2021-08-28 – 2021-09-02 (×11): 1 via ORAL
  Filled 2021-08-28 (×11): qty 1

## 2021-08-28 MED ORDER — INSULIN DETEMIR 100 UNIT/ML ~~LOC~~ SOLN
28.0000 [IU] | Freq: Two times a day (BID) | SUBCUTANEOUS | Status: DC
  Administered 2021-08-28: 28 [IU] via SUBCUTANEOUS
  Filled 2021-08-28 (×2): qty 0.28

## 2021-08-28 NOTE — Consult Note (Signed)
?   ? ? ? ? ?[facility]ious [facility]   ? ?Date of Admission:  08/24/2021   Total days of inpatient antibiotics 3 ? ?      ?Reason for Consult: ESBL Ecoli renal abscess    ?Principal Problem: ?  Uncontrolled type 1 diabetes mellitus with hyperglycemia, with long-term current use of insulin ([place]) ?Active Problems: ?  Chronic pain syndrome ?  Fall due to ambulatory dysfunction ?  Dehydration ?  Elevated serum creatinine ?  Hypokalemia ?  Sepsis due to right perinephric abscess ?  Atypical chest pain ?  Acute metabolic encephalopathy ?  Fever ?  Elevated d-dimer ?  History of anxiety/bipolar disorder ?  Pleural effusion on right ?  Possible RUL septic emboli ?  Sepsis ([place]) ?  Normocytic anemia ?  Hyponatremia ? ? ?Assessment: ?89 YM with chronic pain on opiates(UDS was negative) renal abscess: ?#Renal abscess SP IR aspiration with Cx+ ESBL Ecoli on 4/13  ?#Hx of ESBL Ecoli bacteremia on 2/10 2/2 Ecoli UTI ?-Recent hospitalization for Ecoli bacteremia in Feb 2023 at [facility] treated with cefepime and discharged on levaquin x 10 days. Pt reports he was adherent to levaquin. CT during that admission showed mild diffuse urothelial thickening and enhancement of rt ureter may be sue to recently passes calculus vs ascending UTI.  ?-Left AMA from hospitalization 4/4-4/7(hospitalized for Hyperglycemia) ?Recommendations:  ?-D/C pip-tazo ?-Start ertapenem to complete 2 weeks of antibiotics from aspiration EOT 4/26 ?-Not a good PICC line candidate as he left AMA from recent hospitalization on 08/20/21. Reason was unclear by there were compliance issues noted with diet. On today's discussion pt amenable to staying inpatient to complete antibiotics.  ?-ID follow-up with myself on 5/8 ?Microbiology:   ?Antibiotics: ?Azithromycin 4/11 ?Ceftriaxone 4/11-12 ?Pip-tazo 4/12-p ? ?Cultures: ?Blood ?4/12 ? ?Other ?4/13 renal abscess Cx ? Escherichia coli   ? MIC   ? AMPICILLIN >=32 RESIST... Resistant   ? AMPICILLIN/SULBACTAM  >=32 RESIST... Resistant   ? CEFAZOLIN >=64 RESIST... Resistant   ? CEFEPIME 0.5 SENSITIVE  Sensitive   ? CEFTAZIDIME RESISTANT  Resistant   ? CEFTRIAXONE 32 RESISTANT  Resistant   ? CIPROFLOXACIN 0.5 INTERME... Intermediate   ? GENTAMICIN <=1 SENSITIVE  Sensitive   ? IMIPENEM <=0.25 SENS... Sensitive   ? PIP/TAZO <=4 SENSITIVE  Sensitive   ? TRIMETH/SULFA >=320 RESIS... Resistant   ? ? ?HPI: Sayf Lanzer is a 36 y.o. male with DM, BPD, chronic pain, neuropathy, anxiety presented with complaint of hyperglycemia and falls x2. On arrival pt was tachycardic, wbc 10.9, glc 719 admitted for HHS. CT showed gas containing right perinephric abscess measuring 8.3x 8x 4cm. Pt underwent CT guided aspiration on 4/13 with Cx+ ESBL Ecoli. He is on pip-tazo.  ? ? ?Review of Systems: ?Review of Systems  ?All other systems reviewed and are negative. ? ?Past Medical History:  ?Diagnosis Date  ? Diabetes mellitus without complication ([place])   ? Neuropathy   ? ? ?Social History  ? ?Tobacco Use  ? Smoking status: Every Day  ?  Types: Cigarettes  ? Smokeless tobacco: Never  ?Vaping Use  ? Vaping Use: Never used  ?Substance Use Topics  ? Alcohol use: Yes  ? Drug use: Never  ? ? ?History reviewed. No pertinent family history. ?Scheduled Meds: ? Chlorhexidine Gluconate Cloth  6 each Topical Daily  ? diclofenac Sodium  4 g Topical QID  ? insulin aspart  0-15 Units Subcutaneous TID WC  ? insulin aspart  0-5 Units Subcutaneous QHS  ? insulin aspart  6 Units Subcutaneous TID WC  ? insulin detemir  28 Units Subcutaneous BID  ? multivitamin with minerals  1 tablet Oral Daily  ? oxyCODONE  15 mg Oral Q12H  ? pregabalin  150 mg Oral TID  ? Ensure Max Protein  11 oz Oral Daily  ? QUEtiapine  100 mg Oral QHS  ? traZODone  150 mg Oral QHS  ? ?Continuous Infusions: ? sodium chloride 10 mL/hr at 08/27/21 1200  ? piperacillin-tazobactam (ZOSYN)  IV 3.375 g (08/28/21 1332)  ? ?PRN Meds:.sodium chloride, acetaminophen, dextrose, FLUoxetine, hydrALAZINE,  hydrOXYzine, labetalol, nitroGLYCERIN, ondansetron (ZOFRAN) IV, oxyCODONE-acetaminophen, traMADol ?No Known Allergies ? ?OBJECTIVE: ?Blood pressure 131/74, pulse 96, temperature 98.9 ?F (37.2 ?C), temperature source Oral, resp. rate 15, height 6' (1.829 m), weight 98.4 kg, SpO2 97 %. ? ?Physical Exam ?Constitutional:   ?   General: He is not in acute distress. ?   Appearance: He is normal weight. He is not toxic-appearing.  ?HENT:  ?   Head: Normocephalic and atraumatic.  ?   Right Ear: External ear normal.  ?   Left Ear: External ear normal.  ?   Nose: No congestion or rhinorrhea.  ?   Mouth/Throat:  ?   Mouth: Mucous membranes are moist.  ?   Pharynx: Oropharynx is clear.  ?Eyes:  ?   Extraocular Movements: Extraocular movements intact.  ?   Conjunctiva/sclera: Conjunctivae normal.  ?   Pupils: Pupils are equal, round, and reactive to light.  ?Cardiovascular:  ?   Rate and Rhythm: Normal rate and regular rhythm.  ?   Heart sounds: No murmur heard. ?  No friction rub. No gallop.  ?Pulmonary:  ?   Effort: Pulmonary effort is normal.  ?   Breath sounds: Normal breath sounds.  ?Abdominal:  ?   General: Abdomen is flat. Bowel sounds are normal.  ?   Palpations: Abdomen is soft.  ?   Comments: Right flank drain  ?Musculoskeletal:     ?   General: No swelling. Normal range of motion.  ?   Cervical back: Normal range of motion and neck supple.  ?Skin: ?   General: Skin is warm and dry.  ?Neurological:  ?   General: No focal deficit present.  ?   Mental Status: He is oriented to person, place, and time.  ?Psychiatric:     ?   Mood and Affect: Mood normal.  ? ? ?Lab Results ?Lab Results  ?Component Value Date  ? WBC 6.7 08/28/2021  ? HGB 7.8 (L) 08/28/2021  ? HCT 23.1 (L) 08/28/2021  ? MCV 79.7 (L) 08/28/2021  ? PLT 278 08/28/2021  ?  ?Lab Results  ?Component Value Date  ? CREATININE 1.69 (H) 08/28/2021  ? BUN 17 08/28/2021  ? NA 133 (L) 08/28/2021  ? K 4.3 08/28/2021  ? CL 98 08/28/2021  ? CO2 24 08/28/2021  ?  ?Lab  Results  ?Component Value Date  ? ALT 15 08/26/2021  ? AST 25 08/26/2021  ? ALKPHOS 173 (H) 08/26/2021  ? BILITOT 0.5 08/26/2021  ?  ? ? ? ?Laurice Record, MD ?[facility] for Infectious Disease ?[facility] ?08/28/2021, 2:35 PM  ?

## 2021-08-28 NOTE — Progress Notes (Signed)
Pharmacy Antibiotic Note ? ?Victor Matthews is a 36 y.o. male admitted on 08/24/2021 with fever, sepsis due to perinephric abscess and was empirically started on ceftriaxone. S/p R perinephric drain placement. Pharmacy has been consulted to broaden abx to Zosyn dosing for IAI. ? ?Day #4 Zosyn  ?- Tmax 99.6 ?- WBC WNL ?- SCr slightly improved 1.69, CrCl ~75 ml/min ?- Drain cultures show GNR ? ?Plan: ?Continue Zosyn 3.375g IV q8h (4 hour infusion). ?Monitor clinical progress, renal function ?F/U C&S, abx deescalation / LOT ? ? ?Height: 6' (182.9 cm) ?Weight: 98.4 kg (216 lb 14.9 oz) ?IBW/kg (Calculated) : 77.6 ? ?Temp (24hrs), Avg:98.9 ?F (37.2 ?C), Min:97.8 ?F (36.6 ?C), Max:101.8 ?F (38.8 ?C) ? ?Recent Labs  ?Lab 08/24/21 ?1739 08/24/21 ?1919 08/24/21 ?2240 08/25/21 ?0304 08/25/21 ?5809 08/25/21 ?0759 08/25/21 ?1038 08/26/21 ?0300 08/26/21 ?0812 08/26/21 ?2035 08/27/21 ?0307 08/27/21 ?9833 08/27/21 ?1311 08/28/21 ?0450  ?WBC  --   --   --   --   --   --   --  8.9   < > 9.4 7.6 6.7 7.7 6.7  ?CREATININE  --  1.47*   < > 1.45* 1.59*  --   --  1.74*  --   --  1.72*  --   --  1.69*  ?LATICACIDVEN 2.0* 2.3*  --   --   --  1.5 0.9  --   --   --   --   --   --   --   ? < > = values in this interval not displayed.  ? ?  ?Estimated Creatinine Clearance: 74.8 mL/min (A) (by C-G formula based on SCr of 1.69 mg/dL (H)).   ? ?No Known Allergies ? ?Antimicrobials this admission: ?4/11 CTX/azith x 1 ?4/12 ZEI >>  ? ?Microbiology results: ?4/12 BCx: ngtd ?4/11 MRSA PCR: neg ?4/12 Abscess: abundant GNR ? ?Thank you for allowing pharmacy to be a part of this patient?s care. ? ?Loralee Pacas, PharmD, BCPS ?Pharmacy: [phone] ?08/28/2021 10:07 AM ? ? ?

## 2021-08-28 NOTE — TOC Transition Note (Addendum)
Transition of Care (TOC) - CM/SW Discharge Note ? ? ?Patient Details  ?Name: Victor Matthews ?MRN: [id number] ?Date of Birth: 1985/10/26 ? ?Transition of Care (TOC) CM/SW Contact:  ?Hayla Hinger, Vinnie Langton, LCSW ?Phone Number: ?08/28/2021, 12:01 PM ? ? ?Clinical Narrative:    ? ?CSW has alerted Attending Physician, Dr. Candelaria Stagers that order is needed for PT and OT evaluations, to determine whether or not patient is an appropriate candidate to receive HHPT and HHOT.  CSW will continue to follow.   ?  ?Barriers to Discharge: Continued Medical Work up ? ? ?Patient Goals and CMS Choice ?  ? Home with Home Health Physical Therapy and Occupational Therapy, pending PT and OT evaluation. ?  ?Discharge Placement ?  ?N/A ? ?Discharge Plan and Services ?  ?Home with Home Health Physical Therapy and Occupational Therapy, pending PT and OT evaluation. ? ?Social Determinants of Health (SDOH) Interventions ?  ? ? ?Readmission Risk Interventions ? ?  08/25/2021  ?  3:07 PM  ?Readmission Risk Prevention Plan  ?Transportation Screening Complete  ?PCP or Specialist Appt within 5-7 Days Complete  ? ? ? ? ? ?

## 2021-08-28 NOTE — Progress Notes (Signed)
?Subjective: ?Patient is complaining of intermittent fevers.  H/H stable.  ? ?Objective: ?Vital signs in last 24 hours: ?Temp:  [97.8 ?F (36.6 ?C)-101.8 ?F (38.8 ?C)] 98.1 ?F (36.7 ?C) (04/15 [id number]) ?Pulse Rate:  [85-115] 85 (04/15 0625) ?Resp:  [16-18] 16 (04/15 [id number]) ?BP: (124-146)/(67-95) 124/78 (04/15 [id number]) ?SpO2:  [93 %-98 %] 97 % (04/15 0625) ? ?Intake/Output from previous day: ?04/14 0701 - 04/15 0700 ?In: 3497.4 [P.O.:1920; I.V.:1420.6; IV Piggyback:156.8] ?Out: [id number] [Urine:3450; Drains:95] ? ?Intake/Output this shift: ?No intake/output data recorded. ? ?Physical Exam:  ?General: Alert and oriented ?GU:  Right flank drain in place and draining bloody/purulent fluid.  ? ?Lab Results: ?Recent Labs  ?  08/27/21 ?[id number] 08/27/21 ?1311 08/28/21 ?0450  ?HGB 8.0* 9.2* 7.8*  ?HCT 24.0* 27.1* 23.1*  ? ? ?BMET ?Recent Labs  ?  08/27/21 ?0307 08/28/21 ?0450  ?NA 133* 133*  ?K 4.4 4.3  ?CL 102 98  ?CO2 23 24  ?GLUCOSE 179* 216*  ?BUN 14 17  ?CREATININE 1.72* 1.69*  ?CALCIUM 8.3* 8.7*  ? ? ? ? ?Studies/Results: ?[place] CHEST (PLEURAL EFFUSION) ? ?Result Date: 08/26/2021 ?CLINICAL DATA:  Pleural effusion EXAM: CHEST ULTRASOUND COMPARISON:  None. FINDINGS: Limited ultrasound of the chest was performed for evaluation of pleural fluid. Images demonstrate small volume right pleural effusion, not amenable for safe image guided thoracentesis. No intervention performed. IMPRESSION: Small volume right pleural effusion.  No intervention performed. Electronically Signed   By: Albin Felling M.D.   On: 08/26/2021 17:29  ? ?CT IMAGE GUIDED DRAINAGE BY PERCUTANEOUS CATHETER ? ?Result Date: 08/26/2021 ?INDICATION: Concern for right perinephric abscess EXAM: CT GUIDED DRAINAGE OF right perinephric ABSCESS TECHNIQUE: Multidetector CT imaging of the abdomen was performed following the standard protocol without IV contrast. RADIATION DOSE REDUCTION: This exam was performed according to the departmental dose-optimization program which includes  automated exposure control, adjustment of the mA and/or kV according to patient size and/or use of iterative reconstruction technique. MEDICATIONS: The patient is currently admitted to the hospital and receiving intravenous antibiotics. The antibiotics were administered within an appropriate time frame prior to the initiation of the procedure. ANESTHESIA/SEDATION: Moderate (conscious) sedation was employed during this procedure. A total of Versed 2 mg and Fentanyl 100 mcg was administered intravenously by the radiology nurse. Total intra-service moderate Sedation Time: 42 minutes. The patient's level of consciousness and vital signs were monitored continuously by radiology nursing throughout the procedure under my direct supervision. COMPLICATIONS: None immediate. TECHNIQUE: Informed written consent was obtained from the patient after a thorough discussion of the procedural risks, benefits and alternatives. All questions were addressed. Maximal Sterile Barrier Technique was utilized including caps, mask, sterile gowns, sterile gloves, sterile drape, hand hygiene and skin antiseptic. A timeout was performed prior to the initiation of the procedure. PROCEDURE: The patient was placed prone on the exam table. Limited CT of the abdomen was performed for planning purposes. This again demonstrated a complex air and fluid collection in the right posterosuperior perinephric space. Appropriate skin entry site was marked, the overlying skin was and draped in the standard sterile fashion. Local analgesia was obtained with 1% lidocaine. Using intermittent CT fluoroscopy, a 19 gauge Yueh catheter was advanced into the target perinephric collection. Upon needle access in the perinephric collection, there was returned of scant hemorrhagic fluid. This raise suspicion for hematoma, but given complex air and fluid appearance on CT, the decision was made to proceed with drain placement for high suspicion of abscess. Wire was easily  advanced and confirmed  within the perinephric collection. A 12 Wladyslaw Henrichs dilator was then advanced over the wire. As the dilator was removed, there was more rapid return of blood. At this point, the dilator was replaced to obtain temporary hemostasis. Due to concern for hematoma that may now be rebleeding due to violation of the renal capsule, I consulted the urologist during the procedure. After speaking with the urologist on the phone, the decision was made to continue with drain placement due to high suspicious for abscess and lack of alternative treatment options. We discussed the possibility of the need for further intervention to control bleeding. Using the maintained wire access, a 12 [place] multipurpose locking drainage catheter was easily positioned within the perinephric collection. There was additional return of hemorrhagic material. A sample was sent to the lab for analysis. The bag was attached to bag drainage. It was secured to skin using silk suture and a dressing. The patient tolerated the procedure well without immediate complication. FINDINGS: See above. IMPRESSION: Successful CT-guided placement of a 12 Mackynzie Woolford drainage catheter within the right perinephric collection. There was return of hemorrhagic material, suspicious for perinephric hematoma. Drain placed after intra-procedure discussion with urology. Sample sent to the lab for microbiology analysis. Instructions provided in the EMR for tube management regarding thresholds for output and serial hemoglobin monitoring. Electronically Signed   By: Albin Felling M.D.   On: 08/26/2021 17:21   ? ?Assessment/Plan: ?36 year old male with large right perinephric abscess.  Hx of poorly controlled DM1. Now s/p R perinephric drain placement. Cultures growing GNRs. ? ?- Continue to monitor right perinephric drain output. Output is trending down. ?- Fluid cultures pending. Growing GNR's preliminarily. Continue zosyn and tailor antibiotics as  appropriate. ?-Will continue to monitor  ? ? LOS: 4 days  ? ?Reola Mosher, MD ?[facility] Urology Resident, PGY4 ?[facility] ? ? ?08/28/2021, 9:48 AM  ? ?

## 2021-08-28 NOTE — Assessment & Plan Note (Deleted)
Partly from hyperglycemia. ?-Continue monitoring ?

## 2021-08-28 NOTE — Progress Notes (Signed)
?PROGRESS NOTE ? ?Victor Matthews BMZ:[id number] DOB: August 31, 1985  ? ?PCP: System, Provider Not In ? ?Patient is from: Home.  Recently moved from [place] to [place].  Lives with his godfather. ? ?DOA: 08/24/2021 LOS: 4 ? ?Chief complaints ?Chief Complaint  ?Patient presents with  ? Hyperglycemia  ?  ? ?Brief Narrative / Interim history: ?36 year old M with PMH of DM-1 with peripheral neuropathy, chronic pain on chronic opiate, anxiety, BPD, E. coli bacteremia and COVID-19 infection in 06/2021, and recent hospitalization from 4/4-4/7 for hyperglycemia and ambulatory dysfunction when he left AMA returned to [facility] ED with hyperglycemia and falls x2, and admitted for hyperglycemia to 715, AKI, acute metabolic encephalopathy, possible pneumonia and uncontrolled hypertension.  He was started on insulin drip, IV ceftriaxone and IV fluid.  He also had elevated D-dimer in ED. CTA chest ordered to rule out PE and showed right renal and perinephric abscess, moderate right pleural effusion and possible septic emboli to RUL but negative for PE.  Antibiotics broadened to IV Zosyn.  Urology consulted.  CT abdomen and pelvis obtained confirmed renal abscess.  IR consulted, and drain placed with bloody output.  Abscess culture with GNR.  ? ?Subjective: ?Seen and examined earlier this morning.  No major events overnight of this morning.  Feels sore in his right flank.  No other complaints. ? ?Objective: ?Vitals:  ? 08/27/21 2147 08/28/21 0239 08/28/21 0625 08/28/21 1213  ?BP: (!) 141/83 126/67 124/78 131/74  ?Pulse: (!) 102 (!) 101 85 96  ?Resp: 18 18 16 15   ?Temp:  (!) 101.8 ?F (38.8 ?C) 98.1 ?F (36.7 ?C) 98.9 ?F (37.2 ?C)  ?TempSrc: Oral Oral Oral Oral  ?SpO2: 96% 93% 97% 97%  ?Weight:      ?Height:      ? ? ?Examination: ? ?GENERAL: No apparent distress.  Sitting on the edge of the bed.  Nontoxic. ?HEENT: MMM.  Vision and hearing grossly intact.  ?NECK: Supple.  No apparent JVD.  ?RESP:  No IWOB.  Fair aeration bilaterally. ?CVS:   RRR. Heart sounds normal.  ?ABD/GI/GU: BS+. Abd soft, NTND.  Drain over right back with serosanguineous fluid ?MSK/EXT:  Moves extremities. No apparent deformity. No edema.  ?SKIN: Scaly skin rash on his face this that he attributes to eczema. ?NEURO: Awake and alert. Oriented appropriately.  No apparent focal neuro deficit. ?PSYCH: Calm. Normal affect.  ? ?Procedures:  ?4/13-IR placed drain for perinephric abscess ? ?Microbiology summarized: ?MRSA PCR screen negative. ?Blood cultures NGTD. ?Perinephric abscess culture with multidrug-resistant E. coli sensitive to cefepime, Zosyn and imipenem ? ?Assessment and Plan: ?* Uncontrolled type 1 diabetes mellitus with hyperglycemia, with long-term current use of insulin (HCC) ?Suspect poor compliance contributing to this.  Does not meet criteria for HHS or DKA.  On Levemir 30 units daily and NovoLog 70/30 at 40 units 3 times daily at home?  A1c 14.6% ?Recent Labs  ?Lab 08/27/21 ?1138 08/27/21 ?1642 08/27/21 ?2053 08/28/21 ?3552 08/28/21 ?1209  ?GLUCAP 169* 309* 234* 213* 157*  ?-Continue SSI-moderate. ?-Continue NovoLog 4 units 3 times daily with meals ?-Continue Levemir 25 units twice daily ?-Need to adjust home insulin on discharge ?-Appreciate input by diabetic coordinator. ? ? ?Sepsis due to right perinephric abscess ?CTA C/A/P showed right renal and perinephric abscess with pneumoperitoneum, moderate right pleural effusion and possible septic emboli to RUL.  Of note, patient had an MRI of L-spine on 4/6 that showed heterogeneous, partially cystic lesion of right kidney.  He was also hospitalized and treated for  E. coli bacteremia 2 months ago.   Blood cultures NGTD but obtained after IV ceftriaxone.  MRSA PCR screen negative.  Spiked fever to 101.8 last night. ?-S/p IR drain placement. Fluid culture with multidrug-resistant E. coli but sensitive to Zosyn.  ?-Drain output about 95 cc / 24 hours. ?-Continue IV Zosyn pending input by ID ?-Appreciate help by  urology. ? ? ?Possible RUL septic emboli ?From renal abscess?  Blood cultures NGTD. ?-Antibiotics as above ? ?Pleural effusion on right ?Felt to be reactive secondary to renal abscess. IR thoracocentesis ordered but pleural effusion felt to be small on chest [place]. ? ?Acute metabolic encephalopathy ?Resolved.  Some concern about polypharmacy. ?-Minimize sedating medications. ? ?Atypical chest pain ?Serial troponin and EKG negative.  Likely due to renal abscess and pleural effusion. ?-Management as above ? ? ?Fall due to ambulatory dysfunction ?Ongoing since January 2023.  Unclear etiology but patient is at risk for falls due to diabetic neuropathy and polypharmacy.  Multiple imaging including MRI brain, C, T and L spines, and CT head and cervical spine without significant finding to explain his symptoms.  Neuro exam nonfocal.  Now he is asking to go outside ?-PT/OT eval ?-Fall precaution ? ?Normocytic anemia ?Recent Labs  ?  08/20/21 ?1450 08/24/21 ?[id number] 08/24/21 ?[id number] 08/26/21 ?0300 08/26/21 ?[id number] 08/26/21 ?2035 08/27/21 ?0307 08/27/21 ?[id number] 08/27/21 ?1311 08/28/21 ?0450  ?HGB 10.8* 10.7* 12.2* 7.8* 8.5* 8.3* 7.8* 8.0* 9.2* 7.8*  ?Baseline Hgb 10-11.  Slight drop likely from hemodilution and some perinephric hemorrhage.  H&H relatively stable.  He denies melena or hematochezia. ?-Discontinue IV fluid ?-Continue monitoring ? ? ?Elevated serum creatinine ?Recent Labs  ?  08/20/21 ?1450 08/24/21 ?[id number] 08/24/21 ?1454 08/24/21 ?1919 08/24/21 ?2240 08/25/21 ?0304 08/25/21 ?[id number] 08/26/21 ?0300 08/27/21 ?[id number] 08/28/21 ?0450  ?BUN 16 23* 19 16 15 14 14 15 14 17   ?CREATININE 1.53* 2.16* 1.72* 1.47* 1.48* 1.45* 1.59* 1.74* 1.72* 1.69*  ?Creatinine relatively stable.  AKI ruled out. ?-Monitor off IV fluid. ?-Avoid nephrotoxic meds ? ? ?Chronic pain syndrome ?On  MS Contin, Percocet and Lyrica per narcotic database.  Last fill on 07/12/2021.  Reportedly could not fill last month due to illness and hospitalization. ?-Resume home  Lyrica at reduced dose ?-Replaced home MS Contin with OxyContin  ?-Percocet 5/325 at 3 times daily as needed for (reduced dose from home) ?-Continue Tylenol for mild pain ?-Tramadol as needed for moderate pain ? ?Hyponatremia ?Partly from hyperglycemia. ?-Continue monitoring ? ?History of anxiety/bipolar disorder ?-Continue home Prozac, trazodone and Seroquel. ? ?Elevated d-dimer ?LE Doppler negative for DVT.  CTA chest negative for PE but right perinephric abscess, possible septic emboli and right pleural effusion. ? ?Hypokalemia ?Resolved. ? ?Dehydration ?Likely from hyperglycemia.  Resolved. ? ? ? ?DVT prophylaxis:  ?Place and maintain sequential compression device Start: 08/26/21 1753 ? ?Code Status: full code ?Family Communication: Updated patient's friend from patient's phone at bedside ?Level of care: Med-Surg ?Status is: Inpatient ?Remains inpatient appropriate because: Sepsis due to renal abscess and perinephric abscess ? ? ?Final disposition: TBD ?Consultants:  ?Urology ?Infectious disease ?Interventional radiology ? ?Sch Meds:  ?Scheduled Meds: ? Chlorhexidine Gluconate Cloth  6 each Topical Daily  ? diclofenac Sodium  4 g Topical QID  ? insulin aspart  0-15 Units Subcutaneous TID WC  ? insulin aspart  0-5 Units Subcutaneous QHS  ? insulin aspart  6 Units Subcutaneous TID WC  ? insulin detemir  28 Units Subcutaneous BID  ? multivitamin with minerals  1 tablet Oral Daily  ?  oxyCODONE  15 mg Oral Q12H  ? pregabalin  150 mg Oral TID  ? Ensure Max Protein  11 oz Oral Daily  ? QUEtiapine  100 mg Oral QHS  ? traZODone  150 mg Oral QHS  ? ?Continuous Infusions: ? sodium chloride 10 mL/hr at 08/27/21 1200  ? piperacillin-tazobactam (ZOSYN)  IV 3.375 g (08/28/21 1332)  ? ?PRN Meds:.sodium chloride, acetaminophen, dextrose, FLUoxetine, hydrALAZINE, hydrOXYzine, labetalol, nitroGLYCERIN, ondansetron (ZOFRAN) IV, oxyCODONE-acetaminophen, traMADol ? ?Antimicrobials: ?Anti-infectives (From admission, onward)  ? ? Start      Dose/Rate Route Frequency Ordered Stop  ? 08/25/21 1530  piperacillin-tazobactam (ZOSYN) IVPB 3.375 g       ? 3.375 g ?12.5 mL/hr over 240 Minutes Intravenous Every 8 hours 08/25/21 1431    ? 08/24/21 1915  az

## 2021-08-29 DIAGNOSIS — E878 Other disorders of electrolyte and fluid balance, not elsewhere classified: Secondary | ICD-10-CM

## 2021-08-29 DIAGNOSIS — N151 Renal and perinephric abscess: Secondary | ICD-10-CM

## 2021-08-29 DIAGNOSIS — A4151 Sepsis due to Escherichia coli [E. coli]: Principal | ICD-10-CM

## 2021-08-29 DIAGNOSIS — D649 Anemia, unspecified: Secondary | ICD-10-CM

## 2021-08-29 LAB — CBC WITH DIFFERENTIAL/PLATELET
Abs Immature Granulocytes: 0.06 10*3/uL (ref 0.00–0.07)
Basophils Absolute: 0 10*3/uL (ref 0.0–0.1)
Basophils Relative: 0 %
Eosinophils Absolute: 0.2 10*3/uL (ref 0.0–0.5)
Eosinophils Relative: 3 %
HCT: 23.9 % — ABNORMAL LOW (ref 39.0–52.0)
Hemoglobin: 8.1 g/dL — ABNORMAL LOW (ref 13.0–17.0)
Immature Granulocytes: 1 %
Lymphocytes Relative: 19 %
Lymphs Abs: 1.2 10*3/uL (ref 0.7–4.0)
MCH: 26.6 pg (ref 26.0–34.0)
MCHC: 33.9 g/dL (ref 30.0–36.0)
MCV: 78.6 fL — ABNORMAL LOW (ref 80.0–100.0)
Monocytes Absolute: 0.7 10*3/uL (ref 0.1–1.0)
Monocytes Relative: 11 %
Neutro Abs: 4.2 10*3/uL (ref 1.7–7.7)
Neutrophils Relative %: 66 %
Platelets: 344 10*3/uL (ref 150–400)
RBC: 3.04 MIL/uL — ABNORMAL LOW (ref 4.22–5.81)
RDW: 14 % (ref 11.5–15.5)
WBC: 6.3 10*3/uL (ref 4.0–10.5)
nRBC: 0 % (ref 0.0–0.2)

## 2021-08-29 LAB — RENAL FUNCTION PANEL
Albumin: 2.1 g/dL — ABNORMAL LOW (ref 3.5–5.0)
Anion gap: 8 (ref 5–15)
BUN: 18 mg/dL (ref 6–20)
CO2: 28 mmol/L (ref 22–32)
Calcium: 8.8 mg/dL — ABNORMAL LOW (ref 8.9–10.3)
Chloride: 99 mmol/L (ref 98–111)
Creatinine, Ser: 1.6 mg/dL — ABNORMAL HIGH (ref 0.61–1.24)
GFR, Estimated: 58 mL/min — ABNORMAL LOW (ref >60)
Glucose, Bld: 208 mg/dL — ABNORMAL HIGH (ref 70–99)
Phosphorus: 4.8 mg/dL — ABNORMAL HIGH (ref 2.5–4.6)
Potassium: 4.5 mmol/L (ref 3.5–5.1)
Sodium: 135 mmol/L (ref 135–145)

## 2021-08-29 LAB — GLUCOSE, CAPILLARY
Glucose-Capillary: 270 mg/dL — ABNORMAL HIGH (ref 70–99)
Glucose-Capillary: 216 mg/dL — ABNORMAL HIGH (ref 70–99)
Glucose-Capillary: 171 mg/dL — ABNORMAL HIGH (ref 70–99)

## 2021-08-29 LAB — MAGNESIUM: Magnesium: 2.4 mg/dL (ref 1.7–2.4)

## 2021-08-29 MED ORDER — SODIUM CHLORIDE 0.9 % IV SOLN
1.0000 g | INTRAVENOUS | Status: DC
  Administered 2021-08-29 – 2021-09-02 (×5): 1000 mg via INTRAVENOUS
  Filled 2021-08-29 (×6): qty 1

## 2021-08-29 MED ORDER — GLUCERNA SHAKE PO LIQD
237.0000 mL | Freq: Two times a day (BID) | ORAL | Status: DC
  Administered 2021-08-29 – 2021-09-02 (×9): 237 mL via ORAL
  Filled 2021-08-29 (×11): qty 237

## 2021-08-29 MED ORDER — INSULIN DETEMIR 100 UNIT/ML ~~LOC~~ SOLN
32.0000 [IU] | Freq: Two times a day (BID) | SUBCUTANEOUS | Status: DC
  Administered 2021-08-29 – 2021-08-31 (×5): 32 [IU] via SUBCUTANEOUS
  Filled 2021-08-29 (×6): qty 0.32

## 2021-08-29 NOTE — Progress Notes (Signed)
?PROGRESS NOTE ? ?Victor Matthews GHW:[id number] DOB: 1986/04/23  ? ?PCP: System, Provider Not In ? ?Patient is from: Home.  Recently moved from [place] to [place].  Lives with his godfather. ? ?DOA: 08/24/2021 LOS: 5 ? ?Chief complaints ?Chief Complaint  ?Patient presents with  ? Hyperglycemia  ?  ? ?Brief Narrative / Interim history: ?36 year old M with PMH of DM-1 with peripheral neuropathy, chronic pain on chronic opiate, anxiety, BPD, E. coli bacteremia and COVID-19 infection in 06/2021, and recent hospitalization from 4/4-4/7 for hyperglycemia and ambulatory dysfunction when he left AMA returned to [facility] ED with hyperglycemia and falls x2, and admitted for hyperglycemia to 715, AKI, acute metabolic encephalopathy, possible pneumonia and uncontrolled hypertension.  He was started on insulin drip, IV ceftriaxone and IV fluid.  He also had elevated D-dimer in ED. CTA chest ordered to rule out PE and showed right renal and perinephric abscess, moderate right pleural effusion and possible septic emboli to RUL but negative for PE.  Antibiotics broadened to IV Zosyn.  Urology consulted.  CT abdomen and pelvis obtained confirmed renal abscess.  IR consulted, and drain placed with bloody output.  Abscess culture with ESBL E. Coli. ID recommended Invanz through 4/26 in house.   ? ?Subjective: ?Seen and examined earlier this morning.  No major events overnight of this morning.  Continues to endorse pain in right flank but improved.  Again he asked to go outside to get some fresh air.  He feels overwhelmed and stressed.  We have adjusted and added some medications to help.  Unfortunately, there is concern about his ambulatory dysfunction and risk of fall to allow him to go outside with an IV drip.  He understands about this. ? ?Objective: ?Vitals:  ? 08/28/21 0625 08/28/21 1213 08/28/21 2112 08/29/21 0445  ?BP: 124/78 131/74 136/84 139/87  ?Pulse: 85 96 100 100  ?Resp: 16 15 20 16   ?Temp: 98.1 ?F (36.7 ?C) 98.9 ?F (37.2  ?C) 99.1 ?F (37.3 ?C) 98.9 ?F (37.2 ?C)  ?TempSrc: Oral Oral Oral Oral  ?SpO2: 97% 97% 95% 96%  ?Weight:      ?Height:      ? ? ?Examination: ? ?GENERAL: No apparent distress.  Nontoxic. ?HEENT: MMM.  Vision and hearing grossly intact.  ?NECK: Supple.  No apparent JVD.  ?RESP:  No IWOB.  Fair aeration bilaterally. ?CVS:  RRR. Heart sounds normal.  ?ABD/GI/GU: BS+. Abd soft, NTND.  Drain over right back with serosanguineous fluid. ?MSK/EXT:  Moves extremities. No apparent deformity. No edema.  ?SKIN: Scaly skin rash on his face that he attributes to eczema.  Unchanged. ?NEURO: Awake and alert. Oriented appropriately.  No apparent focal neuro deficit. ?PSYCH: Calm. Normal affect.  ? ?Procedures:  ?4/13-IR placed drain for perinephric abscess ? ?Microbiology summarized: ?MRSA PCR screen negative. ?Blood cultures NGTD. ?Perinephric abscess culture with MDRO/ESBL E. coli ? ?Assessment and Plan: ?* Uncontrolled type 1 diabetes mellitus with hyperglycemia, with long-term current use of insulin (HCC) ?Suspect poor compliance contributing to this.  Does not meet criteria for HHS or DKA.  On Levemir 30 units daily and NovoLog 70/30 at 40 units 3 times daily at home?  A1c 14.6% ?Recent Labs  ?Lab 08/28/21 ?0733 08/28/21 ?1209 08/28/21 ?1653 08/28/21 ?2108 08/29/21 ?08/31/21  ?GLUCAP 213* 157* 262* 293* 270*  ?-Continue SSI-moderate. ?-Continue NovoLog 6 units 3 times daily with meals ?-Continue Levemir 32 units twice daily ?-Change Ensure to Glucerna ?-Need to adjust home insulin on discharge ?-Appreciate input by diabetic coordinator. ? ? ?  Sepsis (HCC) ?CTA C/A/P showed right renal and perinephric abscess with pneumoperitoneum, moderate right pleural effusion and possible septic emboli to RUL.  Of note, patient had an MRI of L-spine on 4/6 that showed heterogeneous, partially cystic lesion of right kidney.  He was also hospitalized and treated for E. coli bacteremia 2 months ago.   Blood cultures NGTD but obtained after IV  ceftriaxone.  MRSA PCR screen negative.  Spiked fever to 101.8 on 4/15. No leukocytosis ?-S/p IR drain placement. Fluid culture with ESBL E. coli ?-IV CTX>>IV Zosyn>IV Invanz through 4/26 per ID. Not a candidate for home infusion ?-Drain management per IR ?-Appreciate help by urology. ? ? ?Possible RUL septic emboli ?From renal abscess?  Blood cultures NGTD. ?-Antibiotics as above ? ?Pleural effusion on right ?Felt to be reactive secondary to renal abscess. IR thoracocentesis ordered but pleural effusion felt to be small on chest [place]. ? ?Acute metabolic encephalopathy ?Resolved.  Some concern about polypharmacy. ?-Minimize sedating medications. ? ?Atypical chest pain ?Serial troponin and EKG negative.  Likely due to renal abscess and pleural effusion. ?-Management as above ? ? ?Fall due to ambulatory dysfunction ?Ongoing since January 2023.  Unclear etiology but patient is at risk for falls due to diabetic neuropathy and polypharmacy.  Multiple imaging including MRI brain, C, T and L spines, and CT head and cervical spine without significant finding to explain his symptoms.  Neuro exam nonfocal.  Now he is asking to go outside ?-PT eval ?-Fall precaution ? ?Normocytic anemia ?Recent Labs  ?  08/24/21 ?7902 08/24/21 ?4097 08/26/21 ?0300 08/26/21 ?3532 08/26/21 ?2035 08/27/21 ?0307 08/27/21 ?9924 08/27/21 ?1311 08/28/21 ?0450 08/29/21 ?0434  ?HGB 10.7* 12.2* 7.8* 8.5* 8.3* 7.8* 8.0* 9.2* 7.8* 8.1*  ?Baseline Hgb 10-11.  Slight drop likely from hemodilution and some perinephric hemorrhage.  H&H relatively stable.  He denies melena or hematochezia. ?-Discontinue IV fluid ?-Continue monitoring ? ? ?Elevated serum creatinine ?Recent Labs  ?  08/24/21 ?2683 08/24/21 ?1454 08/24/21 ?1919 08/24/21 ?2240 08/25/21 ?0304 08/25/21 ?4196 08/26/21 ?0300 08/27/21 ?2229 08/28/21 ?7989 08/29/21 ?0434  ?BUN 23* 19 16 15 14 14 15 14 17 18   ?CREATININE 2.16* 1.72* 1.47* 1.48* 1.45* 1.59* 1.74* 1.72* 1.69* 1.60*  ?Creatinine relatively  stable.  AKI ruled out. ?-Monitor off IV fluid. ?-Avoid nephrotoxic meds ? ? ?Chronic pain syndrome ?On  MS Contin, Percocet and Lyrica per narcotic database.  Last fill on 07/12/2021.  Reportedly could not fill last month due to illness and hospitalization. ?-Resume home Lyrica at reduced dose ?-Replaced home MS Contin with OxyContin  ?-Percocet 5/325 every 6 hours as needed ?-Continue Tylenol for mild pain ?-Tramadol as needed for moderate pain ? ?History of anxiety/bipolar disorder ?Anxious and overwhelmed about prolonged hospitalization.  Repeatedly asking to go outside for fresh air but he is at risk of fall due to ambulatory dysfunction ?-Continue home Prozac, trazodone and Seroquel. ?-Added Klonopin 0.5 mg twice daily and Atarax. ? ?Elevated d-dimer ?LE Doppler negative for DVT.  CTA chest negative for PE but right perinephric abscess, possible septic emboli and right pleural effusion. ? ?Dehydration ?Likely from hyperglycemia.  Resolved. ? ? ? ?DVT prophylaxis:  ?Place and maintain sequential compression device Start: 08/26/21 1753 ? ?Code Status: full code ?Family Communication: None at bedside today. ?Level of care: Med-Surg ?Status is: Inpatient ?Remains inpatient appropriate because: Sepsis due to renal abscess and perinephric abscess.  Not a candidate for home IV infusion. ? ? ?Final disposition: TBD ?Consultants:  ?Urology ?Infectious disease ?Interventional radiology ? ?  Sch Meds:  ?Scheduled Meds: ? Chlorhexidine Gluconate Cloth  6 each Topical Daily  ? clonazePAM  0.5 mg Oral BID  ? diclofenac Sodium  4 g Topical QID  ? feeding supplement (GLUCERNA SHAKE)  237 mL Oral BID BM  ? insulin aspart  0-15 Units Subcutaneous TID WC  ? insulin aspart  0-5 Units Subcutaneous QHS  ? insulin aspart  6 Units Subcutaneous TID WC  ? insulin detemir  32 Units Subcutaneous BID  ? multivitamin with minerals  1 tablet Oral Daily  ? oxyCODONE  15 mg Oral Q12H  ? pregabalin  150 mg Oral TID  ? QUEtiapine  100 mg Oral QHS   ? traZODone  150 mg Oral QHS  ? ?Continuous Infusions: ? sodium chloride 10 mL/hr at 08/27/21 1200  ? ertapenem 1,000 mg (08/29/21 0956)  ? ?PRN Meds:.sodium chloride, acetaminophen, dextrose, FLUoxetine, h

## 2021-08-29 NOTE — Progress Notes (Signed)
Chaplain responded to a page that patient was requesting to talk about living will and HCPOA.  Chaplain met with him and will return tomorrow to provide paperwork and education on the process. ? ?[employer], Bcc ?Pager, [phone] ?

## 2021-08-29 NOTE — Progress Notes (Signed)
?  Subjective: ?Doing well this morning. H/H stable. Drain output decreasing. ?Cultures growing MDR E. Coli. ? ?Objective: ?Vital signs in last 24 hours: ?Temp:  [98.9 ?F (37.2 ?C)-99.1 ?F (37.3 ?C)] 98.9 ?F (37.2 ?C) (04/16 0445) ?Pulse Rate:  [96-100] 100 (04/16 0445) ?Resp:  [15-20] 16 (04/16 0445) ?BP: (131-139)/(74-87) 139/87 (04/16 0445) ?SpO2:  [95 %-97 %] 96 % (04/16 0445) ? ?Intake/Output from previous day: ?04/15 0701 - 04/16 0700 ?In: 1722.6 [P.O.:1440; I.V.:225.7; IV Piggyback:56.9] ?Out: 5210 [Urine:5150; Drains:60] ? ?Intake/Output this shift: ?Total I/O ?In: 240 [P.O.:240] ?Out: 900 [Urine:900] ? ?Physical Exam:  ?General: Alert and oriented ?GU:  Right flank drain in place and draining bloody/purulent fluid.  ? ?Lab Results: ?Recent Labs  ?  08/27/21 ?1311 08/28/21 ?0450 08/29/21 ?0434  ?HGB 9.2* 7.8* 8.1*  ?HCT 27.1* 23.1* 23.9*  ? ? ?BMET ?Recent Labs  ?  08/28/21 ?0450 08/29/21 ?0434  ?NA 133* 135  ?K 4.3 4.5  ?CL 98 99  ?CO2 24 28  ?GLUCOSE 216* 208*  ?BUN 17 18  ?CREATININE 1.69* 1.60*  ?CALCIUM 8.7* 8.8*  ? ? ? ? ?Studies/Results: ?No results found. ? ?Assessment/Plan: ?36 year old male with large right perinephric abscess.  Hx of poorly controlled DM1. Now s/p R perinephric drain placement. Cultures growing MDR E. Coli. ? ?- Continue to monitor right perinephric drain output. Output is trending down. ?- Cultures growing MDR E. Coli. ID recommending 2 weeks inpatient of IV antibiotics and switching from Zosyn to Ertapenem. ?- Will continue to monitor  ? ? LOS: 5 days  ? ?Reola Mosher, MD ?[facility] Urology Resident, PGY4 ?[facility] ? ? ?08/29/2021, 9:13 AM  ? ?

## 2021-08-29 NOTE — Evaluation (Signed)
Physical Therapy Evaluation ?Patient Details ?Name: Victor Matthews ?MRN: [id number] ?DOB: 01-Apr-1986 ?Today's Date: 08/29/2021 ? ?History of Present Illness ? 36 year old Male  admitted for falls x2, hyperglycemia with BS up to 715, AKI, acute metabolic encephalopathy, possible pneumonia and uncontrolled hypertension; also had elevated D-dimer,  CTA chest ordered to rule out PE and showed right renal and perinephric abscess, moderate right pleural effusion and possible septic emboli to RUL but negative for PE.  Marland Kitchen  Urology consulted.  CT abdomen and pelvis obtained and confirmed renal abscess.  IR consulted, and drain placed with bloody output on 4/13  PMH of DM-1 with peripheral neuropathy, chronic pain on chronic opiate, anxiety, BPD,  COVID-19 infection in 06/2021, and recent hospitalization from 4/4-4/7 for hyperglycemia and ambulatory dysfunction when he left AMA returned to [facility] ED as noted above  ?Clinical Impression ? Pt admitted with above diagnosis.  ?Pt amb 50' x2 with min assist, fatigues easily requiring seated rest between distances. May benefit from SNF post acute, pt is agreeable at this time  ? Pt currently with functional limitations due to the deficits listed below (see PT Problem List). Pt will benefit from skilled PT to increase their independence and safety with mobility to allow discharge to the venue listed below.   ?   ?   ? ?Recommendations for follow up therapy are one component of a multi-disciplinary discharge planning process, led by the attending physician.  Recommendations may be updated based on patient status, additional functional criteria and insurance authorization. ? ?Follow Up Recommendations Skilled nursing-short term rehab (<3 hours/day) ? ?  ?Assistance Recommended at Discharge Intermittent Supervision/Assistance  ?Patient can return home with the following ? A little help with walking and/or transfers;A little help with bathing/dressing/bathroom;Assistance with  cooking/housework;Assist for transportation;Help with stairs or ramp for entrance ? ?  ?Equipment Recommendations Other (comment) (TBD)  ?Recommendations for Other Services ?    ?  ?Functional Status Assessment Patient has had a recent decline in their functional status and demonstrates the ability to make significant improvements in function in a reasonable and predictable amount of time.  ? ?  ?Precautions / Restrictions Precautions ?Precautions: Fall ?Precaution Comments: frequent falls, hx of neuropathy ?Restrictions ?Weight Bearing Restrictions: No  ? ?  ? ?Mobility ? Bed Mobility ?Overal bed mobility: Needs Assistance ?Bed Mobility: Supine to Sit ?  ?  ?Supine to sit: Supervision, Modified independent (Device/Increase time) ?  ?  ?General bed mobility comments: Increased time but no physical assistance ?  ? ?Transfers ?Overall transfer level: Needs assistance ?Equipment used: Rolling walker (2 wheels) ?Transfers: Sit to/from Stand ?Sit to Stand: +2 safety/equipment, Min assist ?  ?  ?  ?  ?  ?General transfer comment: incr time, cues for hand placement. min assist to rise and transition to RW ?  ? ?Ambulation/Gait ?Ambulation/Gait assistance: Min assist, Min guard ?Gait Distance (Feet): 50 Feet (x2) ?Assistive device: Rolling walker (2 wheels) ?Gait Pattern/deviations: Step-through pattern, Decreased stride length, Trunk flexed ?  ?  ?  ?General Gait Details: seated rest needed between distances, fatigues easily. cues for RW position and posture, min to min/guard for safety ? ?Stairs ?  ?  ?  ?  ?  ? ?Wheelchair Mobility ?  ? ?Modified Rankin (Stroke Patients Only) ?  ? ?  ? ?Balance Overall balance assessment: Needs assistance, History of Falls ?Sitting-balance support: Feet supported, No upper extremity supported ?Sitting balance-Leahy Scale: Good ?  ?  ?Standing balance support: Reliant on assistive device for  balance ?Standing balance-Leahy Scale: Poor ?  ?  ?  ?  ?  ?  ?  ?  ?  ?  ?  ?  ?    ? ? ? ?Pertinent Vitals/Pain Pain Assessment ?Pain Assessment: Faces ?Faces Pain Scale: Hurts little more ?Pain Descriptors / Indicators: Discomfort ?Pain Intervention(s): Limited activity within patient's tolerance, Monitored during session, Premedicated before session, Repositioned  ? ? ?Home Living Family/patient expects to be discharged to:: Private residence ?Living Arrangements: Other (Comment) ("godfather" per pt) ?Available Help at Discharge: Friend(s);Available PRN/intermittently ?Type of Home: House ?Home Access: Stairs to enter ?  ?Entrance Stairs-Number of Steps: 1 ?  ?Home Layout: Multi-level ?Home Equipment: Cane - single point;Rollator (4 wheels) ?Additional Comments: pt reports his "godfather" is unable to assist, he just "broke his foot"  ?  ?Prior Function Prior Level of Function : Needs assist ?  ?  ?  ?Physical Assist : Mobility (physical) ?  ?  ?Mobility Comments: has been using a cane, limited home ambulation for last 3-4 months that has gotten worse in the last month. pt reports he needs assist he cannot get at home ?  ?  ? ? ?Hand Dominance  ?   ? ?  ?Extremity/Trunk Assessment  ? Upper Extremity Assessment ?Upper Extremity Assessment: Defer to OT evaluation;Overall [facility] for tasks assessed ?  ? ?Lower Extremity Assessment ?RLE Deficits / Details: MMT grossly  DF/PF3+/5, hip flexion 3+/5, knee ext 3+/5. ?RLE Sensation: history of peripheral neuropathy;decreased light touch ?RLE Coordination: decreased fine motor;decreased gross motor ?LLE Deficits / Details: MMT grossly DF/PF 3+/5, hip flexion 3+/5, knee ext 3+/5. Question regarding effort? ?LLE Sensation: history of peripheral neuropathy;decreased light touch ?LLE Coordination: decreased fine motor;decreased gross motor ?  ? ?Cervical / Trunk Assessment ?Cervical / Trunk Assessment: Normal  ?Communication  ? Communication: No difficulties  ?Cognition Arousal/Alertness: Awake/alert ?Behavior During Therapy: [facility] for tasks  assessed/performed ?Overall Cognitive Status: Within Functional Limits for tasks assessed ?  ?  ?  ?  ?  ?  ?  ?  ?  ?  ?  ?  ?  ?  ?  ?  ?  ?  ?  ? ?  ?General Comments   ? ?  ?Exercises    ? ?Assessment/Plan  ?  ?PT Assessment Patient needs continued PT services  ?PT Problem List Decreased strength;Decreased mobility;Decreased range of motion;Decreased activity tolerance;Decreased balance;Decreased knowledge of use of DME;Pain ? ?   ?  ?PT Treatment Interventions DME instruction;Gait training;Therapeutic exercise;Balance training;Stair training;Functional mobility training;Therapeutic activities;Patient/family education   ? ?PT Goals (Current goals can be found in the Care Plan section)  ?Acute Rehab PT Goals ?Patient Stated Goal: to regain PLOF/independence ?PT Goal Formulation: With patient ?Time For Goal Achievement: 09/12/21 ?Potential to Achieve Goals: Good ? ?  ?Frequency Min 2X/week ?  ? ? ?Co-evaluation   ?  ?  ?  ?  ? ? ?  ?AM-PAC PT "6 Clicks" Mobility  ?Outcome Measure Help needed turning from your back to your side while in a flat bed without using bedrails?: None ?Help needed moving from lying on your back to sitting on the side of a flat bed without using bedrails?: A Little ?Help needed moving to and from a bed to a chair (including a wheelchair)?: A Little ?Help needed standing up from a chair using your arms (e.g., wheelchair or bedside chair)?: A Little ?Help needed to walk in hospital room?: A Little ?Help needed climbing 3-5 steps with  a railing? : A Lot ?6 Click Score: 18 ? ?  ?End of Session Equipment Utilized During Treatment: Gait belt ?Activity Tolerance: Patient tolerated treatment well;Patient limited by fatigue;Patient limited by pain ?Patient left: with call bell/phone within reach;in chair;with chair alarm set ?  ?PT Visit Diagnosis: Other abnormalities of gait and mobility (R26.89);History of falling (Z91.81);Muscle weakness (generalized) (M62.81) ?  ? ?Time: [id number] ?PT Time  Calculation (min) (ACUTE ONLY): 20 min ? ? ?Charges:   PT Evaluation ?$PT Eval Low Complexity: 1 Low ?  ?  ?   ? ? ?Baxter Flattery, PT ? ?Acute Rehab Dept [facility] [phone] ?Pager [phone] ? ?08/29/2021 ? ? ?Juana Haralson ?08/29/2021, 4:29 PM ? ?

## 2021-08-30 ENCOUNTER — Inpatient Hospital Stay (HOSPITAL_COMMUNITY): Payer: Medicare Other

## 2021-08-30 DIAGNOSIS — R262 Difficulty in walking, not elsewhere classified: Secondary | ICD-10-CM | POA: Diagnosis not present

## 2021-08-30 DIAGNOSIS — G9341 Metabolic encephalopathy: Secondary | ICD-10-CM | POA: Diagnosis not present

## 2021-08-30 DIAGNOSIS — E1065 Type 1 diabetes mellitus with hyperglycemia: Secondary | ICD-10-CM | POA: Diagnosis not present

## 2021-08-30 DIAGNOSIS — F4321 Adjustment disorder with depressed mood: Secondary | ICD-10-CM

## 2021-08-30 DIAGNOSIS — R0789 Other chest pain: Secondary | ICD-10-CM | POA: Diagnosis not present

## 2021-08-30 LAB — CBC
HCT: 24.7 % — ABNORMAL LOW (ref 39.0–52.0)
Hemoglobin: 8 g/dL — ABNORMAL LOW (ref 13.0–17.0)
MCH: 26.3 pg (ref 26.0–34.0)
MCHC: 32.4 g/dL (ref 30.0–36.0)
MCV: 81.3 fL (ref 80.0–100.0)
Platelets: 400 10*3/uL (ref 150–400)
RBC: 3.04 MIL/uL — ABNORMAL LOW (ref 4.22–5.81)
RDW: 14.5 % (ref 11.5–15.5)
WBC: 6.5 10*3/uL (ref 4.0–10.5)
nRBC: 0 % (ref 0.0–0.2)

## 2021-08-30 LAB — CULTURE, BLOOD (ROUTINE X 2)
Culture: NO GROWTH
Culture: NO GROWTH
Special Requests: ADEQUATE

## 2021-08-30 LAB — RENAL FUNCTION PANEL
Albumin: 2.4 g/dL — ABNORMAL LOW (ref 3.5–5.0)
Anion gap: 9 (ref 5–15)
BUN: 19 mg/dL (ref 6–20)
CO2: 27 mmol/L (ref 22–32)
Calcium: 9.1 mg/dL (ref 8.9–10.3)
Chloride: 98 mmol/L (ref 98–111)
Creatinine, Ser: 1.46 mg/dL — ABNORMAL HIGH (ref 0.61–1.24)
GFR, Estimated: >60 mL/min (ref >60)
Glucose, Bld: 153 mg/dL — ABNORMAL HIGH (ref 70–99)
Phosphorus: 5 mg/dL — ABNORMAL HIGH (ref 2.5–4.6)
Potassium: 4.4 mmol/L (ref 3.5–5.1)
Sodium: 134 mmol/L — ABNORMAL LOW (ref 135–145)

## 2021-08-30 LAB — MAGNESIUM: Magnesium: 2.3 mg/dL (ref 1.7–2.4)

## 2021-08-30 LAB — GLUCOSE, CAPILLARY
Glucose-Capillary: 236 mg/dL — ABNORMAL HIGH (ref 70–99)
Glucose-Capillary: 125 mg/dL — ABNORMAL HIGH (ref 70–99)
Glucose-Capillary: 169 mg/dL — ABNORMAL HIGH (ref 70–99)
Glucose-Capillary: 141 mg/dL — ABNORMAL HIGH (ref 70–99)
Glucose-Capillary: 139 mg/dL — ABNORMAL HIGH (ref 70–99)

## 2021-08-30 IMAGING — CT CT ABD-PELV W/O CM
2 of 4 series · 16 of 46 positions shown, 18 images · non-contrast
Comparison: [DATE]

CLINICAL DATA: Retroperitoneal abscess.



[Series 2: axial st · axial · 0.88mm/px · z∈[-429,+66]mm · 13 of 115 slices shown, 15 images]
[im 8/115  soft-tissue]
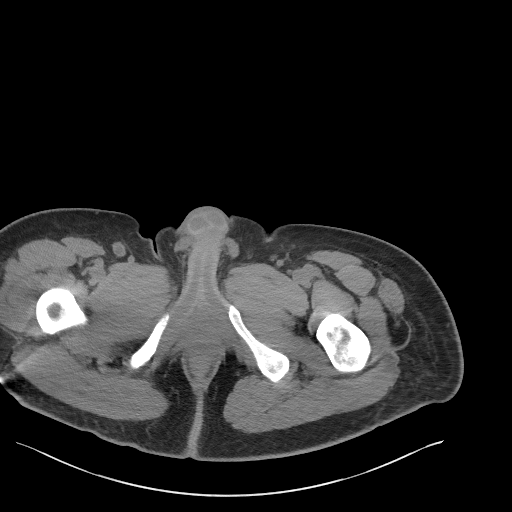
[im 8/115  bone]
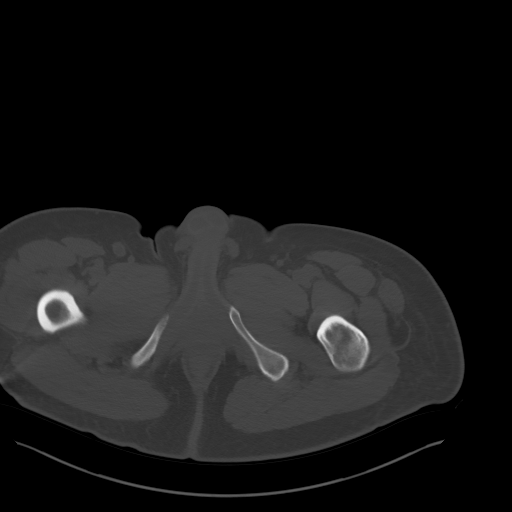
[im 16/115  soft-tissue]
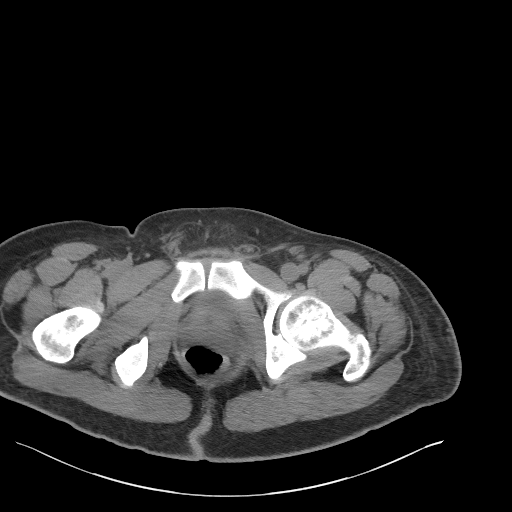
[im 23/115  soft-tissue]
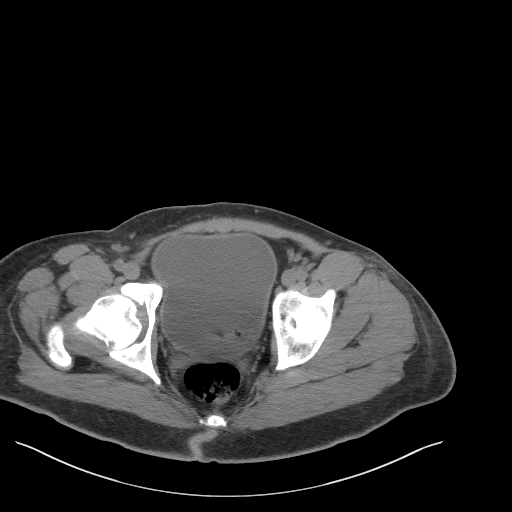
[im 31/115  soft-tissue]
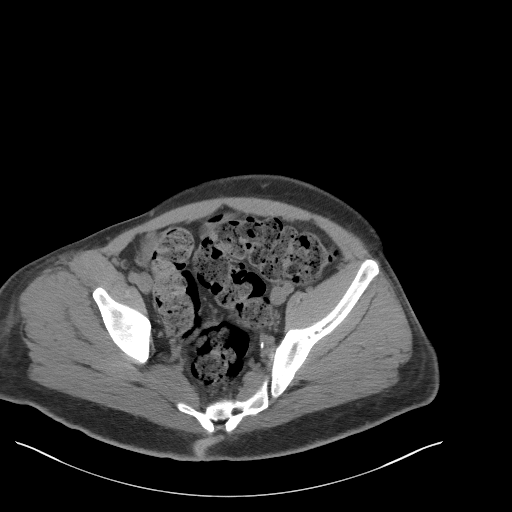
[im 39/115  soft-tissue]
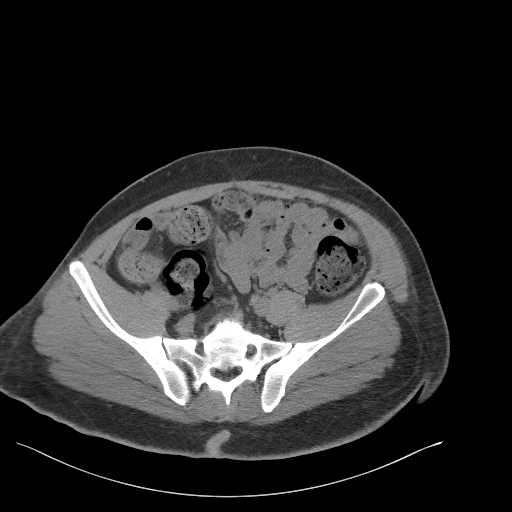
[im 46/115  soft-tissue]
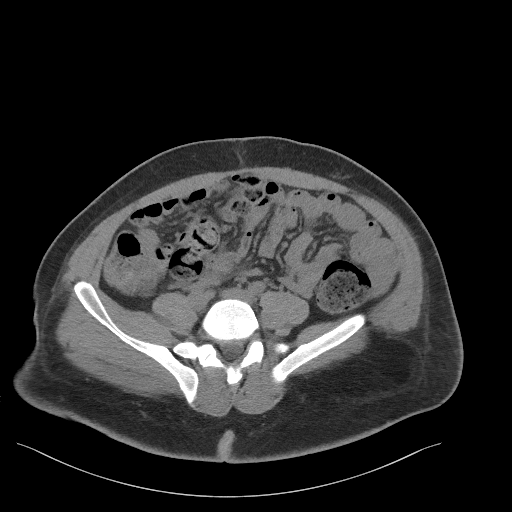
[im 61/115  soft-tissue]
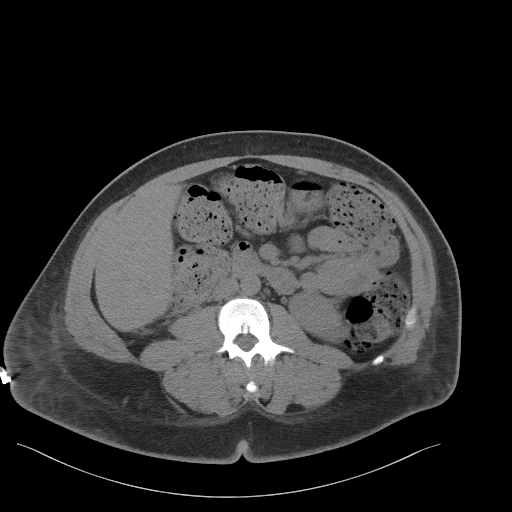
[im 69/115  soft-tissue]
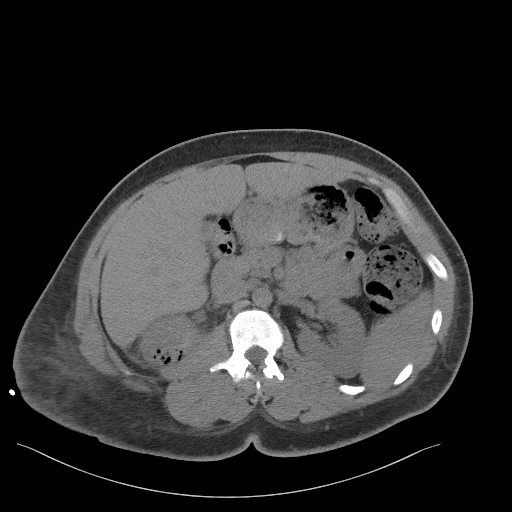
[im 77/115  soft-tissue]
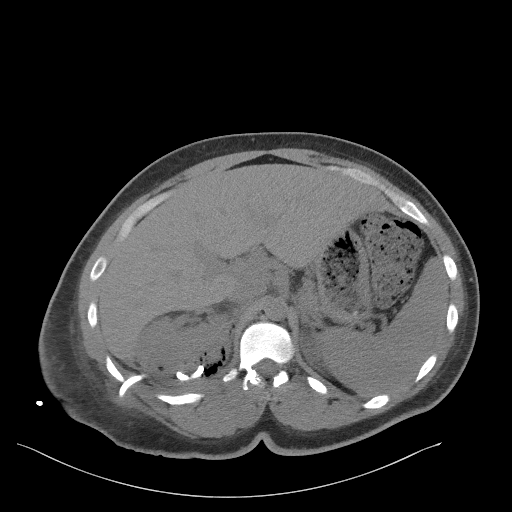
[im 77/115  bone]
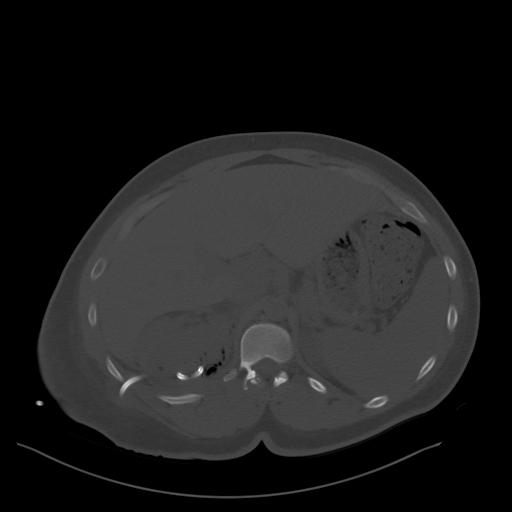
[im 84/115  soft-tissue]
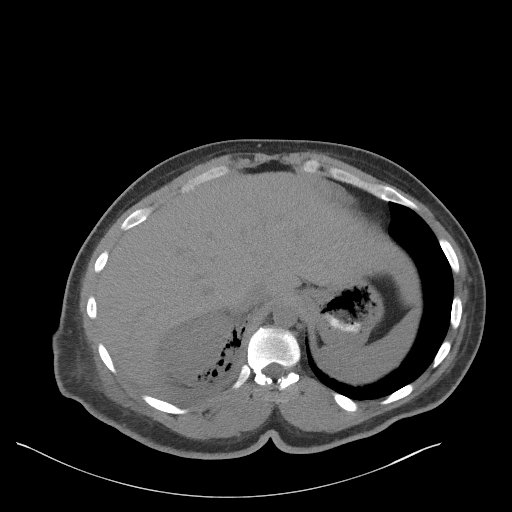
[im 92/115  soft-tissue]
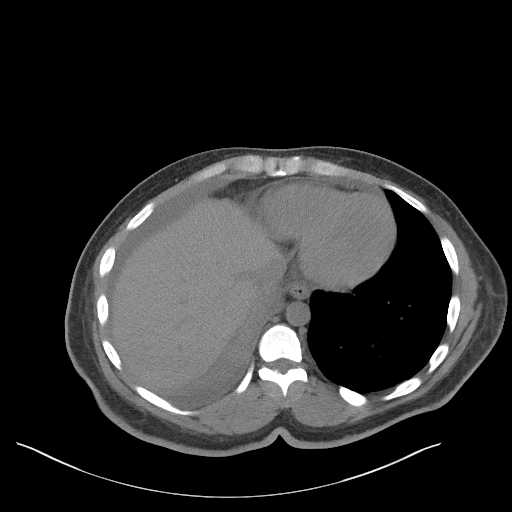
[im 99/115  soft-tissue]
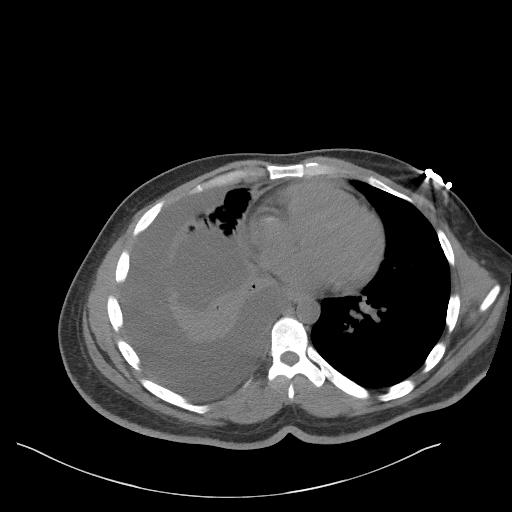
[im 107/115  soft-tissue]
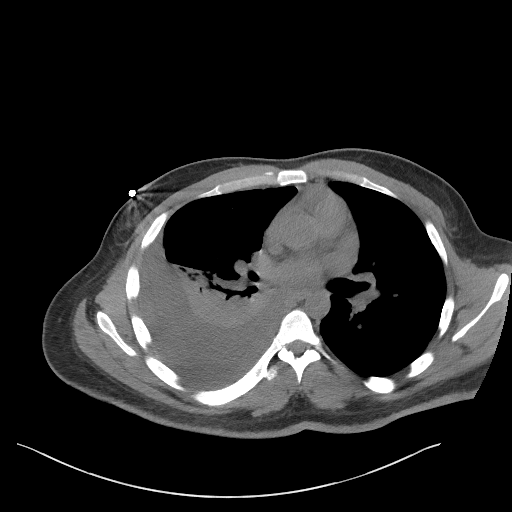

[Series 5: coronal st · coronal · 1.28mm/px · 3 of 92 slices shown]
[im 31/92  soft-tissue]
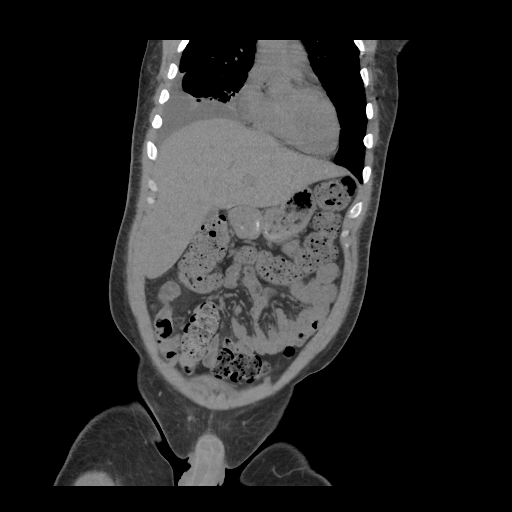
[im 41/92  soft-tissue]
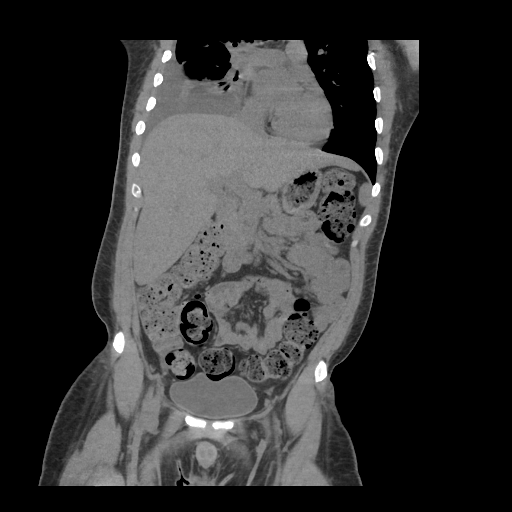
[im 51/92  soft-tissue]
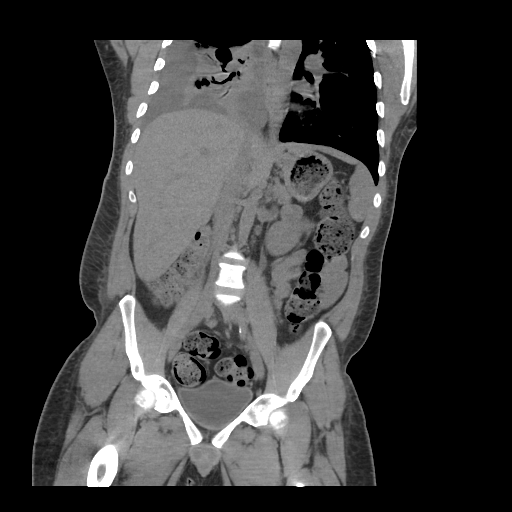

[16 of 46 positions shown; findings below may reference images not displayed]

FINDINGS: Lower chest: Moderate right pleural effusion with adjacent
relaxation atelectasis is similar prior. Round nodular consolidation
in the perifissural posterior left upper lobe measures 14 mm,
unchanged.

Hepatobiliary: Unremarkable noncontrast appearance of the hepatic
parenchyma. Gallbladder is decompressed. No biliary ductal dilation.

Pancreas: No pancreatic ductal dilation or evidence of acute
inflammation.

Spleen: No splenomegaly.

Adrenals/Urinary Tract: Left adrenal gland appears normal. Right
adrenal gland is not well identified.

Interval insertion of a right posterior approach pigtail drainage
catheter in the renal/perinephric gas and fluid collection with
decrease in size of the collection now measuring 8.2 x 6.9 x 1.9 cm
previously 8.3 x 8.0 x 3.9 cm.

No hydronephrosis. No renal, ureteral or bladder calculi identified.

Urinary bladder is unremarkable for degree of distension.

Stomach/Bowel: No enteric contrast was administered. Stomach is
unremarkable for degree of distension. No pathologic dilation of
small or large bowel. Large volume of formed stool throughout the
colon.

Vascular/Lymphatic: Normal caliber abdominal aorta. No
pathologically enlarged abdominal or pelvic lymph nodes. Scattered
prominent abdominopelvic lymph nodes appears similar prior.

Reproductive: Prostate is unremarkable.

Other: Trace free fluid in the right pericolic gutter and pelvis
likely reflect sequela of the renal abscess.

Musculoskeletal: No acute osseous abnormality.
IMPRESSION: 1. Interval insertion of a right posterior approach pigtail drainage
catheter into the renal/perinephric abscess, with interval decreased
abscess volume.
2. Moderate right pleural effusion with adjacent relaxation
atelectasis is similar prior.
3. Round nodular consolidation in the perifissural posterior left
upper lobe measures 14 mm, unchanged.
4. Large volume of formed stool throughout the colon.

## 2021-08-30 MED ORDER — SENNOSIDES-DOCUSATE SODIUM 8.6-50 MG PO TABS
1.0000 | ORAL_TABLET | Freq: Two times a day (BID) | ORAL | Status: DC
  Administered 2021-08-30 – 2021-09-01 (×6): 1 via ORAL
  Filled 2021-08-30 (×7): qty 1

## 2021-08-30 NOTE — Assessment & Plan Note (Signed)
He states he recently lost his father.  ?-Emotional support ?

## 2021-08-30 NOTE — Progress Notes (Signed)
?PROGRESS NOTE ? ?Victor Matthews SHF:[id number] DOB: 09-16-1985  ? ?PCP: System, Provider Not In ? ?Patient is from: Home.  Recently moved from [place] to [place].  Lives with his godfather. ? ?DOA: 08/24/2021 LOS: 6 ? ?Chief complaints ?Chief Complaint  ?Patient presents with  ? Hyperglycemia  ?  ? ?Brief Narrative / Interim history: ?36 year old M with PMH of DM-1 with peripheral neuropathy, chronic pain on chronic opiate, anxiety, BPD, E. coli bacteremia and COVID-19 infection in 06/2021, and recent hospitalization from 4/4-4/7 for hyperglycemia and ambulatory dysfunction when he left AMA returned to [facility] ED with hyperglycemia and falls x2, and admitted for hyperglycemia to 715, AKI, acute metabolic encephalopathy, possible pneumonia and uncontrolled hypertension.  He was started on insulin drip, IV ceftriaxone and IV fluid.  He also had elevated D-dimer in ED. CTA chest ordered to rule out PE and showed right renal and perinephric abscess, moderate right pleural effusion and possible septic emboli to RUL but negative for PE.  Antibiotics broadened to IV Zosyn.  Urology consulted.  CT abdomen and pelvis obtained confirmed renal abscess.  IR consulted, and drain placed with bloody output.  Abscess culture with ESBL E. Coli. ID recommended Invanz through 4/26 in house.  ? ?Therapy recommends SNF.  ? ?Subjective: ?Seen and examined earlier this morning.  No major events overnight of this morning.  Patient is asking if he could be discharged with PICC line for home IV infusion.  He is overwhelmed and frustrated with his prolonged hospital stay and recent loss of his father.  He is threatening to leave AMA.  He was also asking to go outside and get some fresh air.  He has significant ambulatory dysfunction with recurrent fall.  Therapy is recommending SNF.  I have started him on Klonopin and as needed hydroxyzine to help with anxiety.  I offered him psychiatry consult but he refused.  Patient has a capacity to  make medical decision.  He understands risk and benefits although he could be making poor decision if he leaves AMA. ? ?Objective: ?Vitals:  ? 08/29/21 1452 08/29/21 2109 08/30/21 0527 08/30/21 1225  ?BP: 126/79 131/76 121/77 (!) 143/91  ?Pulse: (!) 109 (!) 106 91 90  ?Resp: 14 18 18 16   ?Temp: 98.9 ?F (37.2 ?C) 99.9 ?F (37.7 ?C) 99.2 ?F (37.3 ?C) 99 ?F (37.2 ?C)  ?TempSrc: Oral Oral Oral Oral  ?SpO2: 96% 95% 94% 93%  ?Weight:      ?Height:      ? ? ?Examination: ? ?GENERAL: No apparent distress.  Nontoxic. ?HEENT: MMM.  Vision and hearing grossly intact.  ?NECK: Supple.  No apparent JVD.  ?RESP:  No IWOB.  Fair aeration bilaterally. ?CVS:  RRR. Heart sounds normal.  ?ABD/GI/GU: BS+. Abd soft, NTND.  Drain over right back with serosanguineous fluid. ?MSK/EXT:  Moves extremities. No apparent deformity. No edema.  ?SKIN: Scaly skin rash on his feet that he attributes to eczema.  Unchanged. ?NEURO: Awake and alert. Oriented appropriately.  No apparent focal neuro deficit. ?PSYCH: Appears frustrated and overwhelmed.  ? ?Procedures:  ?4/13-IR placed drain for perinephric abscess ? ?Microbiology summarized: ?MRSA PCR screen negative. ?Blood cultures NGTD. ?Perinephric abscess culture with MDRO/ESBL E. coli ? ?Assessment and Plan: ?* Uncontrolled type 1 diabetes mellitus with hyperglycemia, with long-term current use of insulin (HCC) ?Suspect poor compliance contributing to this.  Does not meet criteria for HHS or DKA.  On Levemir 30 units daily and NovoLog 70/30 at 40 units 3 times daily at home?  A1c 14.6% ?Recent Labs  ?Lab 08/29/21 ?1118 08/29/21 ?1624 08/29/21 ?2103 08/30/21 ?0749 08/30/21 ?1222  ?GLUCAP 216* 171* 236* 125* 169*  ?-Continue SSI-moderate. ?-Continue NovoLog 6 units 3 times daily with meals ?-Continue Levemir 32 units twice daily ?-Changed Ensure to Glucerna ?-Need to adjust home insulin on discharge ?-Appreciate input by diabetic coordinator. ? ? ?Sepsis (HCC) ?CTA C/A/P showed right renal and  perinephric abscess with pneumoperitoneum, moderate right pleural effusion and possible septic emboli to RUL.  Of note, patient had an MRI of L-spine on 4/6 that showed heterogeneous, partially cystic lesion of right kidney.  He was also hospitalized and treated for E. coli bacteremia 2 months ago.   Blood cultures NGTD but obtained after IV ceftriaxone.  MRSA PCR screen negative.  Spiked fever to 101.8 on 4/15. No leukocytosis ?-S/p IR drain placement. Fluid culture with ESBL E. coli ?-IV CTX>>IV Zosyn>IV Invanz through 4/26 per ID.  Not a candidate for home infusion.  No p.o. option ?-Threatening to leave AMA.  He understands the risk and has capacity to make medical decision although he could be making poor decision.  ID, IR and urology notified. ? ? ?Possible RUL septic emboli ?From renal abscess?  Blood cultures NGTD. ?-Antibiotics as above ? ?Pleural effusion on right ?Felt to be reactive secondary to renal abscess. IR thoracocentesis ordered but pleural effusion felt to be small on chest [place]. ? ?Acute metabolic encephalopathy ?Resolved.  Some concern about polypharmacy. ?-Minimize sedating medications. ? ?Atypical chest pain ?Serial troponin and EKG negative.  Likely due to renal abscess and pleural effusion. ?-Management as above ? ? ?Fall due to ambulatory dysfunction ?Ongoing since January 2023.  Unclear etiology but patient is at risk for falls due to diabetic neuropathy and polypharmacy.  Multiple imaging including MRI brain, C, T and L spines, and CT head and cervical spine without significant finding to explain his symptoms.  Neuro exam nonfocal. ?-PT/OT-recommended SNF. ?-Fall precaution ? ?Grief ?He states he recently lost his father.  ?-Emotional support ? ?Normocytic anemia ?Recent Labs  ?  08/24/21 ?6503 08/26/21 ?0300 08/26/21 ?5465 08/26/21 ?2035 08/27/21 ?0307 08/27/21 ?6812 08/27/21 ?1311 08/28/21 ?0450 08/29/21 ?0434 08/30/21 ?0434  ?HGB 12.2* 7.8* 8.5* 8.3* 7.8* 8.0* 9.2* 7.8* 8.1* 8.0*   ?Baseline Hgb 10-11.  Slight drop likely from hemodilution and some perinephric hemorrhage.  H&H relatively stable.  He denies melena or hematochezia. ?-Continue monitoring ? ? ?History of anxiety/bipolar disorder ?Anxious and overwhelmed about prolonged hospitalization.  Repeatedly asking to go outside for fresh air but he is at risk of fall due to ambulatory dysfunction.  Threatening to leave AMA. ?-Continue home Prozac, trazodone and Seroquel. ?-Patient declined psychiatry consultation or help ?-Continue Klonopin 0.5 mg twice daily and Atarax.  Started in house. ? ?Elevated serum creatinine ?Recent Labs  ?  08/24/21 ?1454 08/24/21 ?1919 08/24/21 ?2240 08/25/21 ?0304 08/25/21 ?7517 08/26/21 ?0300 08/27/21 ?0017 08/28/21 ?4944 08/29/21 ?9675 08/30/21 ?0434  ?BUN 19 16 15 14 14 15 14 17 18 19   ?CREATININE 1.72* 1.47* 1.48* 1.45* 1.59* 1.74* 1.72* 1.69* 1.60* 1.46*  ?Creatinine relatively stable.  AKI ruled out. ?-Continue monitoring off IV fluid. ?-Avoid nephrotoxic meds ? ? ?Chronic pain syndrome ?On  MS Contin, Percocet and Lyrica per narcotic database.  Last fill on 07/12/2021.  Reportedly could not fill last month due to illness and hospitalization. ?-Resume home Lyrica at reduced dose ?-Replaced home MS Contin with OxyContin  ?-Percocet 5/325 every 6 hours as needed ?-Continue Tylenol for mild pain ?-Tramadol  as needed for moderate pain ? ?Elevated d-dimer ?LE Doppler negative for DVT.  CTA chest negative for PE but right perinephric abscess, possible septic emboli and right pleural effusion. ? ?Dehydration ?Likely from hyperglycemia.  Resolved. ? ? ? ?DVT prophylaxis:  ?Place and maintain sequential compression device Start: 08/26/21 1753 ? ?Code Status: full code ?Family Communication: None at bedside today. ?Level of care: Med-Surg ?Status is: Inpatient ?Remains inpatient appropriate because: Sepsis due to renal abscess and perinephric abscess.  Not a candidate for home IV infusion. ? ? ?Final disposition:  Therapy recommended SNF ?Consultants:  ?Urology ?Infectious disease ?Interventional radiology ? ?Sch Meds:  ?Scheduled Meds: ? Chlorhexidine Gluconate Cloth  6 each Topical Daily  ? clonazePAM  0.5 mg Oral BID  ? dicl

## 2021-08-30 NOTE — [place] FL2 (Signed)
?[place] MEDICAID FL2 LEVEL OF CARE SCREENING TOOL  ?  ? ?IDENTIFICATION  ?Patient Name: ?Victor Matthews Birthdate: 05-14-1986 Sex: male Admission Date (Current Location): ?08/24/2021  ?[place] and [place] Number: ? [place] ?[id number] L Facility and Address:  ?[facility],  [address] [place], [place] ?     Provider Number: ?[id number]  ?Attending Physician Name and Address:  ?Mercy Riding, MD ? Relative Name and Phone Number:  ?Cathren Harsh (friend) Ph: [phone] ?   ?Current Level of Care: ?Hospital Recommended Level of Care: ?[place] Prior Approval Number: ?  ? ?Date Approved/Denied: ?  PASRR Number: ?[id number] ? ?Discharge Plan: ?SNF ?  ? ?Current Diagnoses: ?Patient Active Problem List  ? Diagnosis Date Noted  ? Hypokalemia and hyponatremia 08/29/2021  ? Kidney, perinephric abscess 08/29/2021  ? Renal abscess, right 08/29/2021  ? Normocytic anemia 08/26/2021  ? Elevated d-dimer 08/25/2021  ? History of anxiety/bipolar disorder 08/25/2021  ? Pleural effusion on right 08/25/2021  ? Possible RUL septic emboli 08/25/2021  ? Uncontrolled type 1 diabetes mellitus with hyperglycemia, with long-term current use of insulin ([place]) 08/24/2021  ? Dehydration 08/24/2021  ? Elevated serum creatinine 08/24/2021  ? Sepsis ([place]) 08/24/2021  ? Atypical chest pain 08/24/2021  ? Acute metabolic encephalopathy 0000000  ? Fever 08/24/2021  ? Right facial numbness 08/19/2021  ? Abnormal MRI, kidney 08/19/2021  ? Fall due to ambulatory dysfunction 08/18/2021  ? Neuropathy 08/18/2021  ? History of bacteremia 08/18/2021  ? History of COVID-19 08/18/2021  ? Unresponsive episode 08/18/2021  ? Chronic pain syndrome 08/17/2021  ? ? ?Orientation RESPIRATION BLADDER Height & Weight   ?  ?Self, Time, Situation, Place ? Normal Continent Weight: 216 lb 14.9 oz (98.4 kg) ?Height:  6' (182.9 cm)  ?BEHAVIORAL SYMPTOMS/MOOD NEUROLOGICAL BOWEL NUTRITION STATUS  ?   (N/A) Continent Diet (Carb modified)   ?AMBULATORY STATUS COMMUNICATION OF NEEDS Skin   ?Limited Assist Verbally Other (Comment) (Ecchymosis: abdomen, arm, leg) ?  ?  ?  ?    ?     ?     ? ? ?Personal Care Assistance Level of Assistance  ?Bathing, Feeding, Dressing Bathing Assistance: Limited assistance ?Feeding assistance: Independent ?Dressing Assistance: Limited assistance ?   ? ?Functional Limitations Info  ?Sight, Hearing, Speech Sight Info: Impaired ?Hearing Info: Adequate ?Speech Info: Adequate  ? ? ?SPECIAL CARE FACTORS FREQUENCY  ?PT (By licensed PT), OT (By licensed OT)   ?  ?PT Frequency: 5x's/week ?OT Frequency: 5x's/week ?  ?  ?  ?   ? ? ?Contractures Contractures Info: Not present  ? ? ?Additional Factors Info  ?Code Status, Allergies, Psychotropic, Insulin Sliding Scale Code Status Info: Full ?Allergies Info: NKA ?Psychotropic Info: Klonopin, Prozac, Seroquel ?Insulin Sliding Scale Info: See discharge summary ?  ?   ? ?Current Medications (08/30/2021):  This is the current hospital active medication list ?Current Facility-Administered Medications  ?Medication Dose Route Frequency Provider Last Rate Last Admin  ? 0.9 %  sodium chloride infusion   Intravenous PRN Wendee Beavers T, MD 10 mL/hr at 08/27/21 1200 Infusion Verify at 08/27/21 1200  ? acetaminophen (TYLENOL) tablet 650 mg  650 mg Oral Q6H PRN Mercy Riding, MD   650 mg at 08/29/21 2115  ? Chlorhexidine Gluconate Cloth 2 % PADS 6 each  6 each Topical Daily Mercy Riding, MD   6 each at 08/30/21 1037  ? clonazePAM (KLONOPIN) tablet 0.5 mg  0.5 mg Oral BID Mercy Riding, MD  0.5 mg at 08/30/21 1035  ? dextrose 50 % solution 0-50 mL  0-50 mL Intravenous PRN Wendee Beavers T, MD      ? diclofenac Sodium (VOLTAREN) 1 % topical gel 4 g  4 g Topical QID Wendee Beavers T, MD   4 g at 08/29/21 2115  ? ertapenem [facility]) 1,000 mg in sodium chloride 0.9 % 100 mL IVPB  1 g Intravenous Q24H Wendee Beavers T, MD 200 mL/hr at 08/30/21 0917 1,000 mg at 08/30/21 0917  ? feeding supplement (GLUCERNA SHAKE)  (GLUCERNA SHAKE) liquid 237 mL  237 mL Oral BID BM Gonfa, Taye T, MD   237 mL at 08/30/21 1036  ? FLUoxetine (PROZAC) capsule 20 mg  20 mg Oral Daily PRN Wendee Beavers T, MD   20 mg at 08/28/21 0558  ? hydrALAZINE (APRESOLINE) injection 10 mg  10 mg Intravenous Q6H PRN Wendee Beavers T, MD   10 mg at 08/25/21 0111  ? hydrOXYzine (ATARAX) tablet 25 mg  25 mg Oral Q6H PRN Mercy Riding, MD   25 mg at 08/29/21 2205  ? insulin aspart (novoLOG) injection 0-15 Units  0-15 Units Subcutaneous TID WC Mercy Riding, MD   3 Units at 08/30/21 1259  ? insulin aspart (novoLOG) injection 0-5 Units  0-5 Units Subcutaneous QHS Mercy Riding, MD   2 Units at 08/29/21 2116  ? insulin aspart (novoLOG) injection 6 Units  6 Units Subcutaneous TID WC Mercy Riding, MD   6 Units at 08/30/21 1300  ? insulin detemir (LEVEMIR) injection 32 Units  32 Units Subcutaneous BID Mercy Riding, MD   32 Units at 08/30/21 1033  ? labetalol (NORMODYNE) injection 10 mg  10 mg Intravenous Q2H PRN Wendee Beavers T, MD   10 mg at 08/25/21 1637  ? multivitamin with minerals tablet 1 tablet  1 tablet Oral Daily Wendee Beavers T, MD   1 tablet at 08/30/21 1035  ? nitroGLYCERIN (NITROSTAT) SL tablet 0.4 mg  0.4 mg Sublingual Q5 min PRN Wendee Beavers T, MD      ? ondansetron (ZOFRAN) injection 4 mg  4 mg Intravenous Q6H PRN Mercy Riding, MD   4 mg at 08/26/21 0914  ? oxyCODONE (OXYCONTIN) 12 hr tablet 15 mg  15 mg Oral Q12H Wendee Beavers T, MD   15 mg at 08/30/21 1035  ? oxyCODONE-acetaminophen (PERCOCET/ROXICET) 5-325 MG per tablet 1 tablet  1 tablet Oral Q6H PRN Mercy Riding, MD   1 tablet at 08/30/21 0547  ? pregabalin (LYRICA) capsule 150 mg  150 mg Oral TID Wendee Beavers T, MD   150 mg at 08/30/21 1033  ? QUEtiapine (SEROQUEL) tablet 100 mg  100 mg Oral QHS Wendee Beavers T, MD   100 mg at 08/29/21 2114  ? senna-docusate (Senokot-S) tablet 1 tablet  1 tablet Oral BID Wendee Beavers T, MD   1 tablet at 08/30/21 1300  ? traMADol (ULTRAM) tablet 50 mg  50 mg Oral Q6H PRN Mercy Riding, MD   50 mg at 08/30/21 0917  ? traZODone (DESYREL) tablet 150 mg  150 mg Oral QHS Wendee Beavers T, MD   150 mg at 08/29/21 2114  ? ? ? ?Discharge Medications: ?Please see discharge summary for a list of discharge medications. ? ?Relevant Imaging Results: ? ?Relevant Lab Results: ? ? ?Additional Information ?SSN: [id number] ? ?Sherie Don, LCSW ? ? ? ? ?

## 2021-08-30 NOTE — Evaluation (Signed)
Occupational Therapy Evaluation ?Patient Details ?Name: Victor Matthews ?MRN: [id number] ?DOB: 1986-03-18 ?Today's Date: 08/30/2021 ? ? ?History of Present Illness 36 year old Male  admitted for falls x2, hyperglycemia with BS up to 715, AKI, acute metabolic encephalopathy, possible pneumonia and uncontrolled hypertension; also had elevated D-dimer,  CTA chest ordered to rule out PE and showed right renal and perinephric abscess, moderate right pleural effusion and possible septic emboli to RUL but negative for PE.  Marland Kitchen  Urology consulted.  CT abdomen and pelvis obtained and confirmed renal abscess.  IR consulted, and drain placed with bloody output on 4/13  PMH of DM-1 with peripheral neuropathy, chronic pain on chronic opiate, anxiety, BPD,  COVID-19 infection in 06/2021, and recent hospitalization from 4/4-4/7 for hyperglycemia and ambulatory dysfunction when he left AMA returned to [facility] ED as noted above  ? ?Clinical Impression ?  ?Patient is a 36 year old male who was admitted for above. Patient was noted to have increased pain in RUE compared to previous evaluations with reports that he had a fall on R side prior to admission. Patient's session was limited by pain on this date. Patient was noted to have decreased functional activity tolerance, decreased endurance, decreased standing balance, decreased safety awareness, decreased endurance and increased pain with movement. Patient would continue to benefit from skilled OT services at this time while admitted and after d/c to address noted deficits in order to improve overall safety and independence in ADLs.  ? ?   ? ?Recommendations for follow up therapy are one component of a multi-disciplinary discharge planning process, led by the attending physician.  Recommendations may be updated based on patient status, additional functional criteria and insurance authorization.  ? ?Follow Up Recommendations ? Skilled nursing-short term rehab (<3 hours/day)  ?  ?Assistance  Recommended at Discharge Frequent or constant Supervision/Assistance  ?Patient can return home with the following A lot of help with bathing/dressing/bathroom;Two people to help with walking and/or transfers;Assistance with cooking/housework;Assist for transportation;Help with stairs or ramp for entrance ? ?  ?Functional Status Assessment ? Patient has had a recent decline in their functional status and demonstrates the ability to make significant improvements in function in a reasonable and predictable amount of time.  ?Equipment Recommendations ? Tub/shower bench;BSC/3in1  ?  ?Recommendations for Other Services   ? ? ?  ?Precautions / Restrictions Precautions ?Precautions: Fall ?Precaution Comments: frequent falls, hx of neuropathy ?Restrictions ?Weight Bearing Restrictions: No  ? ?  ? ?Mobility Bed Mobility ?Overal bed mobility: Needs Assistance ?Bed Mobility: Supine to Sit ?  ?  ?Supine to sit: Supervision, HOB elevated ?Sit to supine: Min assist ?  ?General bed mobility comments: Increased time but no physical assistance to get to the edge of bed. patient needed physical assistance to get BLE back into bed at end of session ?  ? ?Transfers ?  ?  ?  ?  ?  ?  ?  ?  ?  ?  ?  ? ?  ?Balance Overall balance assessment: Needs assistance, History of Falls ?Sitting-balance support: Feet supported, No upper extremity supported ?Sitting balance-Leahy Scale: Good ?  ?  ?Standing balance support: Reliant on assistive device for balance, Bilateral upper extremity supported ?Standing balance-Leahy Scale: Poor ?  ?  ?  ?  ?  ?  ?  ?  ?  ?  ?  ?  ?   ? ?ADL either performed or assessed with clinical judgement  ? ?ADL Overall ADL's : Needs assistance/impaired ?Eating/Feeding: Independent ?  ?  Grooming: Set up;Sitting ?  ?Upper Body Bathing: Set up;Sitting ?  ?Lower Body Bathing: Sitting/lateral leans;Moderate assistance ?Lower Body Bathing Details (indicate cue type and reason): unable to reach lower legs ?Upper Body Dressing :  Sitting;Minimal assistance ?Upper Body Dressing Details (indicate cue type and reason): R shoulder pain ?Lower Body Dressing: Bed level;Maximal assistance ?Lower Body Dressing Details (indicate cue type and reason): unable to bring feet into lap for donning/doffing socks. ?  ?Toilet Transfer Details (indicate cue type and reason): patient was able to stand at edge of bed with min A with RW with increased time with noted flexed trunk and B knees with increased pain. patient was able to participate in few steps to head of bed with increased time and cues. ?Toileting- Clothing Manipulation and Hygiene: Maximal assistance;Sit to/from stand ?  ?  ?  ?  ?General ADL Comments: patient was noted to have slight edema in RLE compared to LLE on this date with increased pain with movements.  ? ? ? ?Vision Patient Visual Report: No change from baseline ?   ?   ?Perception   ?  ?Praxis   ?  ? ?Pertinent Vitals/Pain Pain Assessment ?Pain Assessment: 0-10 ?Pain Score: 10-Worst pain ever ?Pain Location: bilateral pain in feet, LEs, back, R shoulder with movement ?Pain Descriptors / Indicators: Discomfort, Grimacing, Guarding ?Pain Intervention(s): Limited activity within patient's tolerance, Monitored during session, Repositioned, Patient requesting pain meds-RN notified, RN gave pain meds during session, Ice applied  ? ? ? ?Hand Dominance Right ?  ?Extremity/Trunk Assessment Upper Extremity Assessment ?Upper Extremity Assessment: RUE deficits/detail ?RUE Deficits / Details: patient noted to have slight drop when asked to hold shoulder in forward flexion with increased pain. patient was unable to tolerate resistance to external rotation with significant increased pain. patient was noted to have some tenderness anterior glenohumeral joint. patient was able to ABDUCT to 90 degrees. FF to about 80 degrees. elbow and hand WFL. ?RUE: Unable to fully assess due to pain ?RUE Sensation: history of peripheral neuropathy ?LUE Deficits /  Details: WFL ROm, grossly 4+/5 strength ?  ?Lower Extremity Assessment ?Lower Extremity Assessment: Defer to PT evaluation ?  ?Cervical / Trunk Assessment ?Cervical / Trunk Assessment: Normal ?  ?Communication Communication ?Communication: No difficulties ?  ?Cognition Arousal/Alertness: Awake/alert ?Behavior During Therapy: [facility] for tasks assessed/performed ?Overall Cognitive Status: Within Functional Limits for tasks assessed ?  ?  ?  ?  ?  ?  ?  ?  ?  ?  ?  ?  ?  ?  ?  ?  ?  ?  ?  ?General Comments    ? ?  ?Exercises   ?  ?Shoulder Instructions    ? ? ?Home Living Family/patient expects to be discharged to:: Private residence ?Living Arrangements: Other (Comment) ?Available Help at Discharge: Friend(s);Available PRN/intermittently ?Type of Home: House ?Home Access: Stairs to enter ?Entrance Stairs-Number of Steps: 1 ?  ?Home Layout: Multi-level ?  ?Alternate Level Stairs-Rails: Right ?Bathroom Shower/Tub: Tub/shower unit ?  ?  ?  ?  ?  ?  ?  ?  ? ?  ?Prior Functioning/Environment Prior Level of Function : Needs assist ?  ?  ?  ?  ?  ?  ?  ?ADLs Comments: patient reported being independent in ADLs prior level with no AD> ?  ? ?  ?  ?OT Problem List: Decreased strength;Decreased range of motion;Decreased activity tolerance;Impaired balance (sitting and/or standing);Decreased knowledge of use of DME or AE;Decreased coordination;Impaired sensation ?  ?   ?  OT Treatment/Interventions: Self-care/ADL training;Therapeutic exercise;DME and/or AE instruction;Therapeutic activities;Patient/family education;Balance training;Neuromuscular education  ?  ?OT Goals(Current goals can be found in the care plan section) Acute Rehab OT Goals ?Patient Stated Goal: to go to therapy ?OT Goal Formulation: With patient ?Time For Goal Achievement: 09/13/21 ?Potential to Achieve Goals: Good  ?OT Frequency: Min 2X/week ?  ? ?Co-evaluation   ?  ?  ?  ?  ? ?  ?AM-PAC OT "6 Clicks" Daily Activity     ?Outcome Measure Help from another person  eating meals?: None ?Help from another person taking care of personal grooming?: A Little ?Help from another person toileting, which includes using toliet, bedpan, or urinal?: A Lot ?Help from another person bathi

## 2021-08-30 NOTE — Progress Notes (Signed)
?Subjective: ?Patient reports that he still has some flank/back pain.  No fevers.  Appetite fine. ? ?Objective: ?Vital signs in last 24 hours: ?Temp:  [98.9 ?F (37.2 ?C)-99.9 ?F (37.7 ?C)] 99.2 ?F (37.3 ?C) (04/17 0527) ?Pulse Rate:  [91-109] 91 (04/17 0527) ?Resp:  [14-18] 18 (04/17 0527) ?BP: (121-131)/(76-79) 121/77 (04/17 0527) ?SpO2:  [94 %-96 %] 94 % (04/17 0527) ? ?Intake/Output from previous day: ?04/16 0701 - 04/17 0700 ?In: 2900 [P.O.:2800; IV Piggyback:100] ?Out: [id number] [Urine:6500; Drains:140] ?Intake/Output this shift: ?Total I/O ?In: 120 [P.O.:120] ?Out: 910 [Urine:900; Drains:10] ? ?Physical Exam:  ?Constitutional: Vital signs reviewed. WD WN in NAD   ?Eyes: PERRL, No scleral icterus.   ?Cardiovascular: RRR ?Pulmonary/Chest: Normal effort ?Extremities: No cyanosis or edema  ? ?Lab Results: ?Recent Labs  ?  08/28/21 ?[id number] 08/29/21 ?[id number] 08/30/21 ?0434  ?HGB 7.8* 8.1* 8.0*  ?HCT 23.1* 23.9* 24.7*  ? ?BMET ?Recent Labs  ?  08/29/21 ?0434 08/30/21 ?0434  ?NA 135 134*  ?K 4.5 4.4  ?CL 99 98  ?CO2 28 27  ?GLUCOSE 208* 153*  ?BUN 18 19  ?CREATININE 1.60* 1.46*  ?CALCIUM 8.8* 9.1  ? ?No results for input(s): LABPT, INR in the last 72 hours. ?No results for input(s): LABURIN in the last 72 hours. ?Results for orders placed or performed during the hospital encounter of 08/24/21  ?MRSA Next Gen by PCR, Nasal     Status: None  ? Collection Time: 08/24/21  2:33 PM  ? Specimen: Nasal Mucosa; Nasal Swab  ?Result Value Ref Range Status  ? MRSA by PCR Next Gen NOT DETECTED NOT DETECTED Final  ?  Comment: (NOTE) ?The GeneXpert MRSA Assay (FDA approved for NASAL specimens only), ?is one component of a comprehensive MRSA colonization surveillance ?program. It is not intended to diagnose MRSA infection nor to guide ?or monitor treatment for MRSA infections. ?Test performance is not FDA approved in patients less than 2 years ?old. ?Performed at [facility], [address] ?[place], [place] [address] ?   ?Culture, blood (Routine X 2) w Reflex to ID Panel     Status: None  ? Collection Time: 08/25/21  3:35 PM  ? Specimen: BLOOD  ?Result Value Ref Range Status  ? Specimen Description   Final  ?  BLOOD BLOOD RIGHT FOREARM ?Performed at [facility], [place] [address] [place], [place] [address] ?  ? Special Requests   Final  ?  BOTTLES DRAWN AEROBIC ONLY Blood Culture adequate volume ?Performed at [facility], [place] [address], [place], [place] [address] ?  ? Culture   Final  ?  NO GROWTH 5 DAYS ?Performed at [facility], [place] [address], [place], [place] [address] ?  ? Report Status 08/30/2021 FINAL  Final  ?Culture, blood (Routine X 2) w Reflex to ID Panel     Status: None  ? Collection Time: 08/25/21  3:35 PM  ? Specimen: BLOOD  ?Result Value Ref Range Status  ? Specimen Description   Final  ?  BLOOD BLOOD RIGHT HAND ?Performed at [facility], [place] [address] [place], [place] [address] ?  ? Special Requests   Final  ?  BOTTLES DRAWN AEROBIC ONLY Blood Culture results may not be optimal due to an inadequate volume of blood received in culture bottles ?Performed at [facility], [place] [address] [place],  [address] ?  ? Culture   Final  ?  NO GROWTH 5 DAYS ?  Performed at [facility], [place] [address] [place], [place] [address] ?  ? Report Status 08/30/2021 FINAL  Final  ?Aerobic/Anaerobic Culture w Gram Stain (surgical/deep wound)     Status: None (Preliminary result)  ? Collection Time: 08/26/21  2:35 PM  ? Specimen: Abscess  ?Result Value Ref Range Status  ? Specimen Description   Final  ?  ABSCESS ?Performed at [facility], [place] [address] [place], [place] [address] ?  ? Special Requests   Final  ?  ABDOMEN ?Performed at [facility], [place] [address] [place], [place] [address] ?  ? Gram Stain   Final  ?  ABUNDANT WBC PRESENT,BOTH PMN AND MONONUCLEAR ?ABUNDANT GRAM NEGATIVE  RODS ?Performed at [facility], [place] [address], [place], [place] [address] ?  ? Culture   Final  ?  ABUNDANT ESCHERICHIA COLI ?Confirmed Extended Spectrum Beta-Lactamase Producer (ESBL).  In bloodstream infections from ESBL organisms, carbapenems are preferred over piperacillin/tazobactam. They are shown to have a lower risk of mortality. ?NO ANAEROBES ISOLATED; CULTURE IN PROGRESS FOR 5 DAYS ?  ? Report Status PENDING  Incomplete  ? Organism ID, Bacteria ESCHERICHIA COLI  Final  ?    Susceptibility  ? Escherichia coli - MIC*  ?  AMPICILLIN >=32 RESISTANT Resistant   ?  CEFAZOLIN >=64 RESISTANT Resistant   ?  CEFEPIME 0.5 SENSITIVE Sensitive   ?  CEFTAZIDIME RESISTANT Resistant   ?  CEFTRIAXONE 32 RESISTANT Resistant   ?  CIPROFLOXACIN 0.5 INTERMEDIATE Intermediate   ?  GENTAMICIN <=1 SENSITIVE Sensitive   ?  IMIPENEM <=0.25 SENSITIVE Sensitive   ?  TRIMETH/SULFA >=320 RESISTANT Resistant   ?  AMPICILLIN/SULBACTAM >=32 RESISTANT Resistant   ?  PIP/TAZO <=4 SENSITIVE Sensitive   ?  * ABUNDANT ESCHERICHIA COLI  ? ? ?Studies/Results: ?No results found. ? ?Assessment/Plan: ? ?Status post placement of perinephric drain on the right for abscess with associated gas.  He seems to be doing well with this.  Output still moderate. ? ?Will need long-term antibiotics and continued drain placement for now ? ?I will order repeat CT scan to see if there is been adequate drainage ? ?From my standpoint, with him being clinically stable, if CT reveals decrease abscess volume, I am okay for outpatient management. ? ? LOS: 6 days  ? ?Victor Matthews ?08/30/2021, 10:19 AM ? ? ? ?

## 2021-08-30 NOTE — Discharge Instructions (Signed)
Flush drain with 5cc Saline one time daily.  IR scheduler will reach out to you after discharge for 2 week follow up appointment.  May call [phone] with questions/concerns. ?

## 2021-08-30 NOTE — TOC Progression Note (Signed)
Transition of Care (TOC) - Progression Note  ? ?Patient Details  ?Name: Victor Matthews ?MRN: [id number] ?Date of Birth: 1985/12/26 ? ?Transition of Care (TOC) CM/SW Contact  ?Ewing Schlein, LCSW ?Phone Number: ?08/30/2021, 1:49 PM ? ?Clinical Narrative: PT evaluation recommended SNF and patient is agreeable to rehab. FL2 done; PASRR verified. Initial referral faxed out. TOC awaiting bed offers. ? ?Expected Discharge Plan: Skilled Nursing Facility ?Barriers to Discharge: Continued Medical Work up ? ?Expected Discharge Plan and Services ?Expected Discharge Plan: Skilled Nursing Facility ?Living arrangements for the past 2 months: Single Family Home ? ?Readmission Risk Interventions ? ?  08/25/2021  ?  3:07 PM  ?Readmission Risk Prevention Plan  ?Transportation Screening Complete  ?PCP or Specialist Appt within 5-7 Days Complete  ? ? ?

## 2021-08-30 NOTE — Progress Notes (Signed)
? ? ?Supervising Physician: Oley BalmHassell, Daniel ? ?[facility]  [facility] ? ?Chief Complaint: ? ?Peri-nephric abscess ? ?Subjective: ? ?Feeling sad.  Wants to be able to leave, but not AMA.  Expects admission to be another 9 days.  Thinks he could tolerate 9 days if he could get some fresh air.  Curious about what needs to be done for drain when he goes home. ? ?Allergies: ?Patient has no known allergies. ? ?Medications: ?Prior to Admission medications   ?Medication Sig Start Date End Date Taking? Authorizing Provider  ?FLUoxetine (PROZAC) 20 MG capsule Take 20 mg by mouth daily as needed (anxiety attacks).   Yes [provider]  ?insulin aspart protamine- aspart (NOVOLOG MIX 70/30) (70-30) 100 UNIT/ML injection Inject 0.4 mLs (40 Units total) into the skin with breakfast, with lunch, and with evening meal. ?Patient taking differently: Inject 30 Units into the skin with breakfast, with lunch, and with evening meal. 08/09/21  Yes Mesner, Barbara CowerJason, MD  ?insulin detemir (LEVEMIR) 100 UNIT/ML injection Inject 0.3 mLs (30 Units total) into the skin daily. ?Patient taking differently: Inject 40 Units into the skin at bedtime. 08/09/21  Yes Mesner, Barbara CowerJason, MD  ?pregabalin (LYRICA) 300 MG capsule Take 1 capsule (300 mg total) by mouth in the morning, at noon, and at bedtime. 08/09/21 09/08/21 Yes Mesner, Barbara CowerJason, MD  ?QUEtiapine (SEROQUEL) 100 MG tablet Take 100 mg by mouth at bedtime as needed (anxiety).   Yes [provider]  ?traZODone (DESYREL) 150 MG tablet Take 1 tablet (150 mg total) by mouth at bedtime. 08/09/21  Yes Mesner, Barbara CowerJason, MD  ? ? ? ?Vital Signs: ?BP (!) 143/91 (BP Location: Right Arm)   Pulse 90   Temp 99 ?F (37.2 ?C) (Oral)   Resp 16   Ht 6' (1.829 m)   Wt 216 lb 14.9 oz (98.4 kg)   SpO2 93%   BMI 29.42 kg/m?  ? ?Physical Exam ?Vitals reviewed.  ?HENT:  ?   Head: Normocephalic and atraumatic.  ?   Mouth/Throat:  ?   Pharynx: Oropharynx is clear.  ?Eyes:  ?   Extraocular Movements:  Extraocular movements intact.  ?Pulmonary:  ?   Effort: Pulmonary effort is normal.  ?Abdominal:  ?   General: Abdomen is flat.  ?   Palpations: Abdomen is soft.  ?Skin: ?   General: Skin is warm and dry.  ?   Findings: Rash present.  ?Neurological:  ?   General: No focal deficit present.  ?   Mental Status: He is alert.  ?Psychiatric:     ?   Attention and Perception: Attention normal.     ?   Mood and Affect: Mood is depressed.     ?   Speech: Speech normal.     ?   Behavior: Behavior is cooperative.     ?   Cognition and Memory: Cognition normal.     ?   Judgment: Judgment normal.  ? ?Drain Location: CVA right ?Size: Fr size: 10 Fr ?Date of placement: 08/26/21  ?Currently to: Drain collection device: gravity ?24 hour output:  ?Output by Drain (mL) 08/28/21 0701 - 08/28/21 1900 08/28/21 1901 - 08/29/21 0700 08/29/21 0701 - 08/29/21 1900 08/29/21 1901 - 08/30/21 0700 08/30/21 0701 - 08/30/21 1519  ?Closed System Drain 1 Inferior;Lateral;Left Back Other (Comment) 10 Fr. 30 30 30  110 10  ? ? ?Current examination: ?Flushes/aspirates easily.  ?Insertion site unremarkable. ?Suture and stat lock in place. ?Dressed appropriately.  ?Dark bloody OP in  bag ?  ? ?Imaging: ?CT ABDOMEN PELVIS WO CONTRAST ? ?Result Date: 08/30/2021 ?CLINICAL DATA:  Retroperitoneal abscess. EXAM: CT ABDOMEN AND PELVIS WITHOUT CONTRAST TECHNIQUE: Multidetector CT imaging of the abdomen and pelvis was performed following the standard protocol without IV contrast. RADIATION DOSE REDUCTION: This exam was performed according to the departmental dose-optimization program which includes automated exposure control, adjustment of the mA and/or kV according to patient size and/or use of iterative reconstruction technique. COMPARISON:  August 25, 2021 FINDINGS: Lower chest: Moderate right pleural effusion with adjacent relaxation atelectasis is similar prior. Round nodular consolidation in the perifissural posterior left upper lobe measures 14 mm, unchanged.  Hepatobiliary: Unremarkable noncontrast appearance of the hepatic parenchyma. Gallbladder is decompressed. No biliary ductal dilation. Pancreas: No pancreatic ductal dilation or evidence of acute inflammation. Spleen: No splenomegaly. Adrenals/Urinary Tract: Left adrenal gland appears normal. Right adrenal gland is not well identified. Interval insertion of a right posterior approach pigtail drainage catheter in the renal/perinephric gas and fluid collection with decrease in size of the collection now measuring 8.2 x 6.9 x 1.9 cm previously 8.3 x 8.0 x 3.9 cm. No hydronephrosis. No renal, ureteral or bladder calculi identified. Urinary bladder is unremarkable for degree of distension. Stomach/Bowel: No enteric contrast was administered. Stomach is unremarkable for degree of distension. No pathologic dilation of small or large bowel. Large volume of formed stool throughout the colon. Vascular/Lymphatic: Normal caliber abdominal aorta. No pathologically enlarged abdominal or pelvic lymph nodes. Scattered prominent abdominopelvic lymph nodes appears similar prior. Reproductive: Prostate is unremarkable. Other: Trace free fluid in the right pericolic gutter and pelvis likely reflect sequela of the renal abscess. Musculoskeletal: No acute osseous abnormality. IMPRESSION: 1. Interval insertion of a right posterior approach pigtail drainage catheter into the renal/perinephric abscess, with interval decreased abscess volume. 2. Moderate right pleural effusion with adjacent relaxation atelectasis is similar prior. 3. Round nodular consolidation in the perifissural posterior left upper lobe measures 14 mm, unchanged. 4. Large volume of formed stool throughout the colon. Electronically Signed   By: Maudry Mayhew M.D.   On: 08/30/2021 12:30   ? ?Labs: ? ?CBC: ?Recent Labs  ?  08/27/21 ?1311 08/28/21 ?0450 08/29/21 ?0434 08/30/21 ?0434  ?WBC 7.7 6.7 6.3 6.5  ?HGB 9.2* 7.8* 8.1* 8.0*  ?HCT 27.1* 23.1* 23.9* 24.7*  ?PLT 271 278  344 400  ? ? ?COAGS: ?No results for input(s): INR, APTT in the last 8760 hours. ? ?BMP: ?Recent Labs  ?  08/27/21 ?0307 08/28/21 ?0450 08/29/21 ?0434 08/30/21 ?0434  ?NA 133* 133* 135 134*  ?K 4.4 4.3 4.5 4.4  ?CL 102 98 99 98  ?CO2 23 24 28 27   ?GLUCOSE 179* 216* 208* 153*  ?BUN 14 17 18 19   ?CALCIUM 8.3* 8.7* 8.8* 9.1  ?CREATININE 1.72* 1.69* 1.60* 1.46*  ?GFRNONAA 53* 54* 58* >60  ? ? ?LIVER FUNCTION TESTS: ?Recent Labs  ?  08/17/21 ?1411 08/20/21 ?1450 08/24/21 ?10/20/21 08/26/21 ?0300 08/27/21 ?0307 08/28/21 ?0450 08/29/21 ?0434 08/30/21 ?0434  ?BILITOT 0.5 0.1* 0.6 0.5  --   --   --   --   ?AST 19 16 21 25   --   --   --   --   ?ALT 10 8 12 15   --   --   --   --   ?ALKPHOS 147* 113 150* 173*  --   --   --   --   ?PROT 8.7* 8.0 9.3* 6.7  --   --   --   --   ?  ALBUMIN 3.1* 2.9* 3.2* 2.3* 2.2* 2.2* 2.1* 2.4*  ? ? ?Assessment and Plan: ? ?Perinephric abscess ?4 days s/p drain placement, WBC normal, afebrile ?CT today with some improvement ?Discussed strategies with patient to make admission more palatable. ? ?Plan: ?Continue TID flushes with 5 cc NS. ?Record output Q shift. ?Dressing changes QD or PRN if soiled.  ?Call IR APP or on call IR MD if difficulty flushing or sudden change in drain output.  ?Repeat imaging/possible drain injection once output < 10 mL/QD (excluding flush material.) ? ?Discharge planning: ?Please contact IR APP or on call IR MD prior to patient d/c to ensure appropriate follow up plans are in place. Typically patient will follow up with IR clinic 10-14 days post d/c for repeat imaging/possible drain injection. IR scheduler will contact patient with date/time of appointment. Patient will need to flush drain QD with 5 cc NS, record output QD, dressing changes every 2-3 days or earlier if soiled.  ? ?IR will continue to follow - please call with questions or concerns. ? ?Electronically Signed: ?Sheliah Plane, PA ?08/30/2021, 3:10 PM ? ? ?I spent a total of 35 Minutes at the the patient's bedside  AND on the patient's hospital floor or unit, greater than 50% of which was counseling/coordinating care for medical planning associated with abscess drain. ? ? ? ? ? ?

## 2021-08-31 DIAGNOSIS — G9341 Metabolic encephalopathy: Secondary | ICD-10-CM | POA: Diagnosis not present

## 2021-08-31 DIAGNOSIS — E1065 Type 1 diabetes mellitus with hyperglycemia: Secondary | ICD-10-CM | POA: Diagnosis not present

## 2021-08-31 DIAGNOSIS — R262 Difficulty in walking, not elsewhere classified: Secondary | ICD-10-CM | POA: Diagnosis not present

## 2021-08-31 DIAGNOSIS — R0789 Other chest pain: Secondary | ICD-10-CM | POA: Diagnosis not present

## 2021-08-31 DIAGNOSIS — F17200 Nicotine dependence, unspecified, uncomplicated: Secondary | ICD-10-CM

## 2021-08-31 LAB — AEROBIC/ANAEROBIC CULTURE W GRAM STAIN (SURGICAL/DEEP WOUND)

## 2021-08-31 LAB — GLUCOSE, CAPILLARY
Glucose-Capillary: 88 mg/dL (ref 70–99)
Glucose-Capillary: 108 mg/dL — ABNORMAL HIGH (ref 70–99)
Glucose-Capillary: 133 mg/dL — ABNORMAL HIGH (ref 70–99)
Glucose-Capillary: 73 mg/dL (ref 70–99)

## 2021-08-31 MED ORDER — INSULIN DETEMIR 100 UNIT/ML ~~LOC~~ SOLN
25.0000 [IU] | Freq: Two times a day (BID) | SUBCUTANEOUS | Status: DC
  Administered 2021-09-01 – 2021-09-02 (×3): 25 [IU] via SUBCUTANEOUS
  Filled 2021-08-31 (×5): qty 0.25

## 2021-08-31 MED ORDER — INSULIN ASPART 100 UNIT/ML IJ SOLN
4.0000 [IU] | Freq: Three times a day (TID) | INTRAMUSCULAR | Status: DC
  Administered 2021-08-31 – 2021-09-02 (×7): 4 [IU] via SUBCUTANEOUS

## 2021-08-31 NOTE — Assessment & Plan Note (Signed)
Encouraged cessation but he is not ready. ?Declined nicotine patch. ?

## 2021-08-31 NOTE — Progress Notes (Signed)
Physical Therapy Treatment ?Patient Details ?Name: Victor Matthews ?MRN: [id number] ?DOB: [birth year] ?Today's Date: 08/31/2021 ? ? ?History of Present Illness 36 year old Male  admitted for falls x2, hyperglycemia with BS up to 715, AKI, acute metabolic encephalopathy, possible pneumonia and uncontrolled hypertension; also had elevated D-dimer,  CTA chest ordered to rule out PE and showed right renal and perinephric abscess, moderate right pleural effusion and possible septic emboli to RUL but negative for PE.  Marland Kitchen  Urology consulted.  CT abdomen and pelvis obtained and confirmed renal abscess.  IR consulted, and drain placed with bloody output on 4/13  PMH of DM-1 with peripheral neuropathy, chronic pain on chronic opiate, anxiety, BPD,  COVID-19 infection in 06/2021, and recent hospitalization from 4/4-4/7 for hyperglycemia and ambulatory dysfunction when he left AMA returned to [facility] ED as noted above ? ?  ?PT Comments  ? ? Pt sitting up in bed and agreeable to be seen; reporting 9/10 pain. Pt required min assist for bed mobility, min guard for transfers, and min guard for ambulation of ~125 feet with RW and +2 recliner follow; pt took one seated rest break after ~35ft. Pt very interested in going outdoors and voiced interest to PT; assured pt that the care team and MD are aware of his interest and are looking into it. Encouraged pt to keep positive attitude. Discharge destination remains appropriate. We will continue to follow him acutely to promote independence with functional mobility.  ?  ?Recommendations for follow up therapy are one component of a multi-disciplinary discharge planning process, led by the attending physician.  Recommendations may be updated based on patient status, additional functional criteria and insurance authorization. ? ?Follow Up Recommendations ? Skilled nursing-short term rehab (<3 hours/day) ?  ?  ?Assistance Recommended at Discharge Intermittent Supervision/Assistance  ?Patient can return  home with the following A little help with walking and/or transfers;A little help with bathing/dressing/bathroom;Assistance with cooking/housework;Assist for transportation;Help with stairs or ramp for entrance ?  ?Equipment Recommendations ? Other (comment) (defer to SNF)  ?  ?Recommendations for Other Services   ? ? ?  ?Precautions / Restrictions Precautions ?Precautions: Fall;Other (comment) (Contact) ?Precaution Comments: frequent falls, hx of neuropathy ?Restrictions ?Weight Bearing Restrictions: No  ?  ? ?Mobility ? Bed Mobility ?Overal bed mobility: Needs Assistance ?Bed Mobility: Supine to Sit, Sit to Supine ?  ?  ?Supine to sit: Supervision, HOB elevated ?Sit to supine: Min assist ?  ?General bed mobility comments: Increased time but no physical assistance to get to the edge of bed. Patient needed min assistto get RLE back into bed at end of session ?  ? ?Transfers ?Overall transfer level: Needs assistance ?Equipment used: Rolling walker (2 wheels) ?Transfers: Sit to/from Stand ?Sit to Stand: Min guard ?  ?  ?  ?  ?  ?General transfer comment: Pt min guard for sit to stand transfer, no physical assist or cuing required. ?  ? ?Ambulation/Gait ?Ambulation/Gait assistance: Min guard, +2 safety/equipment ?Gait Distance (Feet): 125 Feet ?Assistive device: Rolling walker (2 wheels), IV Pole ?Gait Pattern/deviations: Step-through pattern, Decreased stride length, Trunk flexed ?Gait velocity: decreased ?  ?  ?General Gait Details: Pt required min guard for safety only, no physical assist required, +2 for recliner follow. Pt demonstrated step-through pattern with forward trunk lean. Pt requierd seated rest break after ~34ft as the pt fatigues easily. Once pt returned to room he requested to use IV pole to walk to bathroom rather than RW, pt demonstrated increased anterior trunk lean and  more random stepping pattern than using RW, also used bed rails when returning to bed. ? ? ?Stairs ?  ?  ?  ?  ?  ? ? ?Wheelchair  Mobility ?  ? ?Modified Rankin (Stroke Patients Only) ?  ? ? ?  ?Balance Overall balance assessment: Needs assistance, History of Falls ?Sitting-balance support: Feet supported, No upper extremity supported ?Sitting balance-Leahy Scale: Good ?  ?  ?Standing balance support: Reliant on assistive device for balance, Bilateral upper extremity supported ?Standing balance-Leahy Scale: Poor ?  ?  ?  ?  ?  ?  ?  ?  ?  ?  ?  ?  ?  ? ?  ?Cognition Arousal/Alertness: Awake/alert ?Behavior During Therapy: [facility] for tasks assessed/performed ?Overall Cognitive Status: Within Functional Limits for tasks assessed ?  ?  ?  ?  ?  ?  ?  ?  ?  ?  ?  ?  ?  ?  ?  ?  ?  ?  ?  ? ?  ?Exercises   ? ?  ?General Comments General comments (skin integrity, edema, etc.): Pt requesting to go outdoors, acknowledged request and let pt know MD is aware. ?  ?  ? ?Pertinent Vitals/Pain Pain Assessment ?Pain Assessment: 0-10 ?Pain Score: 9  ?Faces Pain Scale: Hurts little more ?Pain Descriptors / Indicators: Discomfort ?Pain Intervention(s): Monitored during session, Repositioned  ? ? ?Home Living   ?  ?  ?  ?  ?  ?  ?  ?  ?  ?   ?  ?Prior Function    ?  ?  ?   ? ?PT Goals (current goals can now be found in the care plan section) Acute Rehab PT Goals ?Patient Stated Goal: to regain PLOF/independence ?PT Goal Formulation: With patient ?Time For Goal Achievement: 09/12/21 ?Potential to Achieve Goals: Good ?Progress towards PT goals: Progressing toward goals ? ?  ?Frequency ? ? ? Min 2X/week ? ? ? ?  ?PT Plan Current plan remains appropriate  ? ? ?Co-evaluation   ?  ?  ?  ?  ? ?  ?AM-PAC PT "6 Clicks" Mobility   ?Outcome Measure ? Help needed turning from your back to your side while in a flat bed without using bedrails?: None ?Help needed moving from lying on your back to sitting on the side of a flat bed without using bedrails?: A Little ?Help needed moving to and from a bed to a chair (including a wheelchair)?: A Little ?Help needed standing up from a  chair using your arms (e.g., wheelchair or bedside chair)?: A Little ?Help needed to walk in hospital room?: A Little ?Help needed climbing 3-5 steps with a railing? : A Lot ?6 Click Score: 18 ? ?  ?End of Session Equipment Utilized During Treatment: Gait belt ?Activity Tolerance: Patient tolerated treatment well;Patient limited by fatigue;Patient limited by pain ?Patient left: with call bell/phone within reach;in bed;with bed alarm set ?Nurse Communication: Mobility status ?PT Visit Diagnosis: Other abnormalities of gait and mobility (R26.89);History of falling (Z91.81);Muscle weakness (generalized) (M62.81) ?  ? ? ?Time: 1007-1219 ?PT Time Calculation (min) (ACUTE ONLY): 25 min ? ?Charges:  $Gait Training: 23-37 mins          ?          ? ?Jamesetta Geralds, PT, DPT ?[facility] Department ?Office: [phone] ?Pager: [phone] ? ? ?Jamesetta Geralds ?08/31/2021, 12:43 PM ? ?

## 2021-08-31 NOTE — Progress Notes (Signed)
?PROGRESS NOTE ? ?Victor Matthews [id number] DOB: 27-Nov-1985  ? ?PCP: System, Provider Not In ? ?Patient is from: Home.  Recently moved from [place] to [place].  Lives with his godfather. ? ?DOA: 08/24/2021 LOS: 7 ? ?Chief complaints ?Chief Complaint  ?Patient presents with  ? Hyperglycemia  ?  ? ?Brief Narrative / Interim history: ?36 year old M with PMH of DM-1 with peripheral neuropathy, chronic pain on chronic opiate, anxiety, BPD, E. coli bacteremia and COVID-19 infection in 06/2021, and recent hospitalization from 4/4-4/7 for hyperglycemia and ambulatory dysfunction when he left AMA returned to [facility] ED with hyperglycemia and falls x2, and admitted for hyperglycemia to 715, AKI, acute metabolic encephalopathy, possible pneumonia and uncontrolled hypertension.  He was started on insulin drip, IV ceftriaxone and IV fluid.  He also had elevated D-dimer in ED. CTA chest ordered to rule out PE and showed right renal and perinephric abscess, moderate right pleural effusion and possible septic emboli to RUL but negative for PE.  Antibiotics broadened to IV Zosyn.  Urology consulted.  CT abdomen and pelvis obtained confirmed renal abscess.  IR consulted, and drain placed with bloody output.  Abscess culture with ESBL E. Coli. ID recommended Invanz through 4/26 in house.  ? ?Therapy recommends SNF.  TOC notified.  ? ?Subjective: ?Seen and examined earlier this morning.  No major events overnight of this morning.  Continues to endorse right flank pain but improved.  He is asking if he is allowed to go outside on wheelchair at least for 10 minutes and get a fresh air.  He is agreeable to rehab placement.  No other complaints. ? ?Objective: ?Vitals:  ? 08/30/21 1225 08/30/21 2118 08/31/21 0509 08/31/21 1317  ?BP: (!) 143/91 124/84 124/79 129/78  ?Pulse: 90 100 90 92  ?Resp: 16 17 18 16   ?Temp: 99 ?F (37.2 ?C) 99.2 ?F (37.3 ?C) 99.2 ?F (37.3 ?C) 98.4 ?F (36.9 ?C)  ?TempSrc: Oral Oral Oral Oral  ?SpO2: 93% 97% 95% 97%   ?Weight:      ?Height:      ? ? ?Examination: ? ?GENERAL: No apparent distress.  Nontoxic. ?HEENT: MMM.  Vision and hearing grossly intact.  ?NECK: Supple.  No apparent JVD.  ?RESP:  No IWOB.  Fair aeration bilaterally. ?CVS:  RRR. Heart sounds normal.  ?ABD/GI/GU: BS+. Abd soft, NTND.  Drain over right back with serosanguineous fluid. ?MSK/EXT:  Moves extremities. No apparent deformity. No edema.  ?SKIN: no apparent skin lesion or wound ?NEURO: Awake and alert. Oriented appropriately.  No apparent focal neuro deficit. ?PSYCH: Calm. Normal affect.  ? ?Procedures:  ?4/13-IR placed drain for perinephric abscess ? ?Microbiology summarized: ?MRSA PCR screen negative. ?Blood cultures NGTD. ?Perinephric abscess culture with MDRO/ESBL E. coli ? ?Assessment and Plan: ?* Uncontrolled type 1 diabetes mellitus with hyperglycemia, with long-term current use of insulin ([place]) ?Suspect poor compliance contributing to this.  Does not meet criteria for HHS or DKA.  On Levemir 30 units daily and NovoLog 70/30 at 40 units 3 times daily at home?  A1c 14.6% ?Recent Labs  ?Lab 08/30/21 ?1222 08/30/21 ?1640 08/30/21 ?2126 08/31/21 ?0727 08/31/21 ?1149  ?GLUCAP 169* 141* 139* 88 108*  ?-Continue SSI-moderate. ?-Decrease NovoLog from 6-4 units 3 times daily with meals ?-Decrease Levemir from 32 to 25 units twice daily ?-Need to adjust home insulin on discharge ?-Appreciate input by diabetic coordinator. ? ? ?Sepsis ([place]) ?CTA C/A/P showed right renal and perinephric abscess with pneumoperitoneum, moderate right pleural effusion and possible septic emboli to  RUL.  Of note, patient had an MRI of L-spine on 4/6 that showed heterogeneous, partially cystic lesion of right kidney.  He was also hospitalized and treated for E. coli bacteremia 2 months ago.   Blood cultures NGTD but obtained after IV ceftriaxone.  MRSA PCR screen negative.  Spiked fever to 101.8 on 4/15. No leukocytosis ?-S/p IR drain placement. Fluid culture with ESBL E. coli ?-IV  CTX>>IV Zosyn>IV Invanz through 4/26 per ID.  Not a candidate for home infusion.  No p.o. option ?-Regardless, therapy recommended SNF.  He is on board. ?-Threatened to leave AMA on 4/17.  He has medical decision-making capacity ? ? ?Possible RUL septic emboli ?From renal abscess?  Blood cultures NGTD. ?-Antibiotics as above ? ?Pleural effusion on right ?Felt to be reactive secondary to renal abscess. IR thoracocentesis ordered but pleural effusion felt to be small on chest [place]. ? ?Acute metabolic encephalopathy ?Likely from polypharmacy and infection.  Resolved. ? ?Atypical chest pain ?Serial troponin and EKG negative.  Likely due to renal abscess and pleural effusion. ?-Management as above ? ? ?Fall due to ambulatory dysfunction ?Ongoing since January 2023.  Unclear etiology but patient is at risk for falls due to diabetic neuropathy and polypharmacy.  Multiple imaging including MRI brain, C, T and L spines, and CT head and cervical spine without significant finding to explain his symptoms.  Neuro exam nonfocal. ?-PT/OT-recommended SNF. ?-Fall precaution ? ?Grief ?He states he recently lost his father.  ?-Emotional support ? ?Normocytic anemia ?Recent Labs  ?  08/24/21 ?[id number] 08/26/21 ?0300 08/26/21 ?[id number] 08/26/21 ?2035 08/27/21 ?0307 08/27/21 ?[id number] 08/27/21 ?1311 08/28/21 ?0450 08/29/21 ?0434 08/30/21 ?0434  ?HGB 12.2* 7.8* 8.5* 8.3* 7.8* 8.0* 9.2* 7.8* 8.1* 8.0*  ?Baseline Hgb 10-11.  Slight drop likely from hemodilution and some perinephric hemorrhage.  H&H relatively stable.  He denies melena or hematochezia. ?-Continue monitoring ? ? ?History of anxiety/bipolar disorder ?Anxious and overwhelmed about prolonged hospitalization.  Repeatedly asking to go outside for fresh air but he is at risk of fall due to ambulatory dysfunction.  Threatening to leave AMA. ?-Continue home Prozac, trazodone and Seroquel. ?-Patient declined psychiatry consultation or help ?-Continue Klonopin 0.5 mg twice daily and Atarax.  Started  in house. ? ?Elevated serum creatinine ?Recent Labs  ?  08/24/21 ?1454 08/24/21 ?1919 08/24/21 ?2240 08/25/21 ?0304 08/25/21 ?[id number] 08/26/21 ?0300 08/27/21 ?[id number] 08/28/21 ?[id number] 08/29/21 ?[id number] 08/30/21 ?0434  ?BUN 19 16 15 14 14 15 14 17 18 19   ?CREATININE 1.72* 1.47* 1.48* 1.45* 1.59* 1.74* 1.72* 1.69* 1.60* 1.46*  ?Creatinine relatively stable.  AKI ruled out. ?-Continue monitoring off IV fluid. ?-Avoid nephrotoxic meds ? ? ?Chronic pain syndrome ?On  MS Contin, Percocet and Lyrica per narcotic database.  Last fill on 07/12/2021.  Reportedly could not fill last month due to illness and hospitalization. ?-Resume home Lyrica at reduced dose ?-Replaced home MS Contin with OxyContin  ?-Percocet 5/325 every 6 hours as needed ?-Continue Tylenol for mild pain ?-Tramadol as needed for moderate pain ? ?Elevated d-dimer ?LE Doppler negative for DVT.  CTA chest negative for PE but right perinephric abscess, possible septic emboli and right pleural effusion. ? ?Dehydration ?Likely from hyperglycemia.  Resolved. ? ? ? ?DVT prophylaxis:  ?Place and maintain sequential compression device Start: 08/26/21 1753 ? ?Code Status: full code ?Family Communication: None at bedside today. ?Level of care: Med-Surg ?Status is: Inpatient ?Remains inpatient appropriate because: Sepsis due to renal abscess and perinephric abscess and SNF bed ? ? ?Final disposition: SNF  once bed available. ?Consultants:  ?Urology ?Infectious disease ?Interventional radiology ? ?Sch Meds:  ?Scheduled Meds: ? Chlorhexidine Gluconate Cloth  6 each Topical Daily  ? clonazePAM  0.5 mg Oral BID  ? diclofenac Sodium  4 g Topical QID  ? feeding supplement (GLUCERNA SHAKE)  237 mL Oral BID BM  ? insulin aspart  0-15 Units Subcutaneous TID WC  ? insulin aspart  0-5 Units Subcutaneous QHS  ? insulin aspart  4 Units Subcutaneous TID WC  ? insulin detemir  25 Units Subcutaneous BID  ? multivitamin with minerals  1 tablet Oral Daily  ? oxyCODONE  15 mg Oral Q12H  ? pregabalin   150 mg Oral TID  ? QUEtiapine  100 mg Oral QHS  ? senna-docusate  1 tablet Oral BID  ? traZODone  150 mg Oral QHS  ? ?Continuous Infusions: ? sodium chloride 10 mL/hr at 08/27/21 1200  ? ertapenem

## 2021-08-31 NOTE — Progress Notes (Signed)
? ? ?Referring Physician(s): Dahlstedt, Annie Main  ? ?Supervising Physician: Michaelle Birks ? ?Patient Status:  [facility] - In-pt ? ?Chief Complaint: ? ?Right perinephric fluid collection, s/p aspiration of hemorrhagic material and drain placement by  Dr. Denna Haggard on 08/26/21.  ? ?Subjective: ? ?Patient laying in bed, NAD.  ?Reports soreness around the drain site.  ?Denies n/v.  ? ?Allergies: ?Patient has no known allergies. ? ?Medications: ?Prior to Admission medications   ?Medication Sig Start Date End Date Taking? Authorizing Provider  ?FLUoxetine (PROZAC) 20 MG capsule Take 20 mg by mouth daily as needed (anxiety attacks).   Yes [provider]  ?insulin aspart protamine- aspart (NOVOLOG MIX 70/30) (70-30) 100 UNIT/ML injection Inject 0.4 mLs (40 Units total) into the skin with breakfast, with lunch, and with evening meal. ?Patient taking differently: Inject 30 Units into the skin with breakfast, with lunch, and with evening meal. 08/09/21  Yes Mesner, Corene Cornea, MD  ?insulin detemir (LEVEMIR) 100 UNIT/ML injection Inject 0.3 mLs (30 Units total) into the skin daily. ?Patient taking differently: Inject 40 Units into the skin at bedtime. 08/09/21  Yes Mesner, Corene Cornea, MD  ?pregabalin (LYRICA) 300 MG capsule Take 1 capsule (300 mg total) by mouth in the morning, at noon, and at bedtime. 08/09/21 09/08/21 Yes Mesner, Corene Cornea, MD  ?QUEtiapine (SEROQUEL) 100 MG tablet Take 100 mg by mouth at bedtime as needed (anxiety).   Yes [provider]  ?traZODone (DESYREL) 150 MG tablet Take 1 tablet (150 mg total) by mouth at bedtime. 08/09/21  Yes Mesner, Corene Cornea, MD  ? ? ? ?Vital Signs: ?BP 124/79 (BP Location: Right Arm)   Pulse 90   Temp 99.2 ?F (37.3 ?C) (Oral)   Resp 18   Ht 6' (1.829 m)   Wt 216 lb 14.9 oz (98.4 kg)   SpO2 95%   BMI 29.42 kg/m?  ? ?Physical Exam ?Vitals reviewed.  ?Constitutional:   ?   General: He is not in acute distress. ?   Appearance: Normal appearance. He is not ill-appearing.  ?HENT:  ?   Head:  Normocephalic and atraumatic.  ?Pulmonary:  ?   Effort: Pulmonary effort is normal.  ?Abdominal:  ?   General: Abdomen is flat.  ?   Palpations: Abdomen is soft.  ?Skin: ?   General: Skin is warm and dry.  ?   Coloration: Skin is not jaundiced or pale.  ?   Comments: Positive right flank drain to a gravity bag. Dressing is clean, dry, and intact. 20 ml of  bloody fluid noted in the bag. Drain aspirates and flushes well.  ?  ?Neurological:  ?   Mental Status: He is alert and oriented to person, place, and time.  ?Psychiatric:     ?   Mood and Affect: Mood normal.     ?   Behavior: Behavior normal.  ? ? ?Imaging: ?CT ABDOMEN PELVIS WO CONTRAST ? ?Result Date: 08/30/2021 ?CLINICAL DATA:  Retroperitoneal abscess. EXAM: CT ABDOMEN AND PELVIS WITHOUT CONTRAST TECHNIQUE: Multidetector CT imaging of the abdomen and pelvis was performed following the standard protocol without IV contrast. RADIATION DOSE REDUCTION: This exam was performed according to the departmental dose-optimization program which includes automated exposure control, adjustment of the mA and/or kV according to patient size and/or use of iterative reconstruction technique. COMPARISON:  August 25, 2021 FINDINGS: Lower chest: Moderate right pleural effusion with adjacent relaxation atelectasis is similar prior. Round nodular consolidation in the perifissural posterior left upper lobe measures 14 mm, unchanged. Hepatobiliary: Unremarkable noncontrast  appearance of the hepatic parenchyma. Gallbladder is decompressed. No biliary ductal dilation. Pancreas: No pancreatic ductal dilation or evidence of acute inflammation. Spleen: No splenomegaly. Adrenals/Urinary Tract: Left adrenal gland appears normal. Right adrenal gland is not well identified. Interval insertion of a right posterior approach pigtail drainage catheter in the renal/perinephric gas and fluid collection with decrease in size of the collection now measuring 8.2 x 6.9 x 1.9 cm previously 8.3 x 8.0 x 3.9  cm. No hydronephrosis. No renal, ureteral or bladder calculi identified. Urinary bladder is unremarkable for degree of distension. Stomach/Bowel: No enteric contrast was administered. Stomach is unremarkable for degree of distension. No pathologic dilation of small or large bowel. Large volume of formed stool throughout the colon. Vascular/Lymphatic: Normal caliber abdominal aorta. No pathologically enlarged abdominal or pelvic lymph nodes. Scattered prominent abdominopelvic lymph nodes appears similar prior. Reproductive: Prostate is unremarkable. Other: Trace free fluid in the right pericolic gutter and pelvis likely reflect sequela of the renal abscess. Musculoskeletal: No acute osseous abnormality. IMPRESSION: 1. Interval insertion of a right posterior approach pigtail drainage catheter into the renal/perinephric abscess, with interval decreased abscess volume. 2. Moderate right pleural effusion with adjacent relaxation atelectasis is similar prior. 3. Round nodular consolidation in the perifissural posterior left upper lobe measures 14 mm, unchanged. 4. Large volume of formed stool throughout the colon. Electronically Signed   By: Dahlia Bailiff M.D.   On: 08/30/2021 12:30   ? ?Labs: ? ?CBC: ?Recent Labs  ?  08/27/21 ?1311 08/28/21 ?0450 08/29/21 ?0434 08/30/21 ?0434  ?WBC 7.7 6.7 6.3 6.5  ?HGB 9.2* 7.8* 8.1* 8.0*  ?HCT 27.1* 23.1* 23.9* 24.7*  ?PLT 271 278 344 400  ? ? ?COAGS: ?No results for input(s): INR, APTT in the last 8760 hours. ? ?BMP: ?Recent Labs  ?  08/27/21 ?0307 08/28/21 ?0450 08/29/21 ?0434 08/30/21 ?0434  ?NA 133* 133* 135 134*  ?K 4.4 4.3 4.5 4.4  ?CL 102 98 99 98  ?CO2 23 24 28 27   ?GLUCOSE 179* 216* 208* 153*  ?BUN 14 17 18 19   ?CALCIUM 8.3* 8.7* 8.8* 9.1  ?CREATININE 1.72* 1.69* 1.60* 1.46*  ?GFRNONAA 53* 54* 58* >60  ? ? ?LIVER FUNCTION TESTS: ?Recent Labs  ?  08/17/21 ?1411 08/20/21 ?1450 08/24/21 ?[id number] 08/26/21 ?0300 08/27/21 ?0307 08/28/21 ?0450 08/29/21 ?0434 08/30/21 ?0434  ?BILITOT  0.5 0.1* 0.6 0.5  --   --   --   --   ?AST 19 16 21 25   --   --   --   --   ?ALT 10 8 12 15   --   --   --   --   ?ALKPHOS 147* 113 150* 173*  --   --   --   --   ?PROT 8.7* 8.0 9.3* 6.7  --   --   --   --   ?ALBUMIN 3.1* 2.9* 3.2* 2.3* 2.2* 2.2* 2.1* 2.4*  ? ? ?Assessment and Plan: ? ?36 y.o. male with right perinephric fluid collection, s/p aspiration of hemorrhagic material and drain placement by  Dr. Denna Haggard on 08/26/21.  ? ?OP 30 mL overnight - bloody  ?Drain functional  ?Cx E coli  ? ?Drain Location: right flank ?Size: Fr size: 12 Fr ?Date of placement: 08/26/21  ?Currently to: Drain collection device: gravity ?24 hour output:  ?Output by Drain (mL) 08/29/21 0701 - 08/29/21 1900 08/29/21 1901 - 08/30/21 0700 08/30/21 0701 - 08/30/21 1900 08/30/21 1901 - 08/31/21 0700 08/31/21 0701 - 08/31/21 1136  ?Closed  System Drain 1 Inferior;Lateral;Left Back Other (Comment) 10 Fr. 30 110 20 10 5   ? ? ?Interval imaging/drain manipulation:  ?4/17 CT AP with out: ?1. Interval insertion of a right posterior approach pigtail drainage ?catheter into the renal/perinephric abscess, with interval decreased ?abscess volume. ?2. Moderate right pleural effusion with adjacent relaxation ?atelectasis is similar prior. ?3. Round nodular consolidation in the perifissural posterior left ?upper lobe measures 14 mm, unchanged. ?4. Large volume of formed stool throughout the colon ? ?Current examination: ?Flushes/aspirates easily.  ?Site not checked as patient did move for assessment  ?Dressed appropriately.  ? ?Plan: ?Continue TID flushes with 5 cc NS. ?Record output Q shift. ?Dressing changes QD or PRN if soiled.  ?Call IR APP or on call IR MD if difficulty flushing or sudden change in drain output.  ?Repeat imaging/possible drain injection once output < 10 mL/QD (excluding flush material.) ? ?Discharge planning: ?Please contact IR APP or on call IR MD prior to patient d/c to ensure appropriate follow up plans are in place. Typically patient  will follow up with IR clinic 10-14 days post d/c for repeat imaging/possible drain injection. IR scheduler will contact patient with date/time of appointment. Patient will need to flush drain QD with 5 cc NS, r

## 2021-08-31 NOTE — TOC Progression Note (Addendum)
Transition of Care (TOC) - Progression Note  ? ?Patient Details  ?Name: Victor Matthews ?MRN: [id number] ?Date of Birth: 1985/07/27 ? ?Transition of Care (TOC) CM/SW Contact  ?Ewing Schlein, LCSW ?Phone Number: ?08/31/2021, 3:23 PM ? ?Clinical Narrative: Patient selected [facility] for SNF and CSW confirmed bed with Kitty in admissions. Facility can accept the patient tomorrow pending insurance approval. CSW started insurance authorization in the [facility] portal. Reference ID # is: [id number]. TOC awaiting insurance approval. ? ?Addendum: CSW received call from [facility] in admissions stating the that DON will have to review the patient in the morning due to his age and she will notify CSW in the morning if the facility can take him tomorrow or not. ? ?Expected Discharge Plan: Skilled Nursing Facility ?Barriers to Discharge: Continued Medical Work up ? ?Expected Discharge Plan and Services ?Expected Discharge Plan: Skilled Nursing Facility ?Living arrangements for the past 2 months: Single Family Home ? ?Readmission Risk Interventions ? ?  08/25/2021  ?  3:07 PM  ?Readmission Risk Prevention Plan  ?Transportation Screening Complete  ?PCP or Specialist Appt within 5-7 Days Complete  ? ?

## 2021-08-31 NOTE — Progress Notes (Signed)
Chaplain assisted Victor Matthews in understanding and filling out advance directive paperwork. Chaplain gathered witnesses and notary and the document was signed and notarized.  Chaplain made copies for patient, scanned and sent a copy to [facility] and placed a paper copy in his chart. ? ?[employer], Bcc ?Pager, [phone] ? ?

## 2021-09-01 ENCOUNTER — Inpatient Hospital Stay (HOSPITAL_COMMUNITY): Payer: Medicare Other

## 2021-09-01 DIAGNOSIS — E1065 Type 1 diabetes mellitus with hyperglycemia: Secondary | ICD-10-CM | POA: Diagnosis not present

## 2021-09-01 LAB — CBC
HCT: 29.4 % — ABNORMAL LOW (ref 39.0–52.0)
Hemoglobin: 9.5 g/dL — ABNORMAL LOW (ref 13.0–17.0)
MCH: 26.2 pg (ref 26.0–34.0)
MCHC: 32.3 g/dL (ref 30.0–36.0)
MCV: 81 fL (ref 80.0–100.0)
Platelets: 565 10*3/uL — ABNORMAL HIGH (ref 150–400)
RBC: 3.63 MIL/uL — ABNORMAL LOW (ref 4.22–5.81)
RDW: 14.3 % (ref 11.5–15.5)
WBC: 6 10*3/uL (ref 4.0–10.5)
nRBC: 0 % (ref 0.0–0.2)

## 2021-09-01 LAB — GLUCOSE, CAPILLARY
Glucose-Capillary: 124 mg/dL — ABNORMAL HIGH (ref 70–99)
Glucose-Capillary: 138 mg/dL — ABNORMAL HIGH (ref 70–99)
Glucose-Capillary: 144 mg/dL — ABNORMAL HIGH (ref 70–99)
Glucose-Capillary: 173 mg/dL — ABNORMAL HIGH (ref 70–99)
Glucose-Capillary: 199 mg/dL — ABNORMAL HIGH (ref 70–99)

## 2021-09-01 LAB — RENAL FUNCTION PANEL
Albumin: 2.8 g/dL — ABNORMAL LOW (ref 3.5–5.0)
Anion gap: 11 (ref 5–15)
BUN: 22 mg/dL — ABNORMAL HIGH (ref 6–20)
CO2: 27 mmol/L (ref 22–32)
Calcium: 9.5 mg/dL (ref 8.9–10.3)
Chloride: 97 mmol/L — ABNORMAL LOW (ref 98–111)
Creatinine, Ser: 1.69 mg/dL — ABNORMAL HIGH (ref 0.61–1.24)
GFR, Estimated: 54 mL/min — ABNORMAL LOW (ref >60)
Glucose, Bld: 122 mg/dL — ABNORMAL HIGH (ref 70–99)
Phosphorus: 5.8 mg/dL — ABNORMAL HIGH (ref 2.5–4.6)
Potassium: 4.4 mmol/L (ref 3.5–5.1)
Sodium: 135 mmol/L (ref 135–145)

## 2021-09-01 LAB — MAGNESIUM: Magnesium: 2.4 mg/dL (ref 1.7–2.4)

## 2021-09-01 IMAGING — DX DG CHEST 1V PORT
1 series · 1 of 1 positions shown · non-contrast
Comparison: Chest x-ray [DATE] and chest CT [DATE].

CLINICAL DATA: Shortness of breath.

EXAM:
PORTABLE CHEST 1 VIEW

[chest ap]
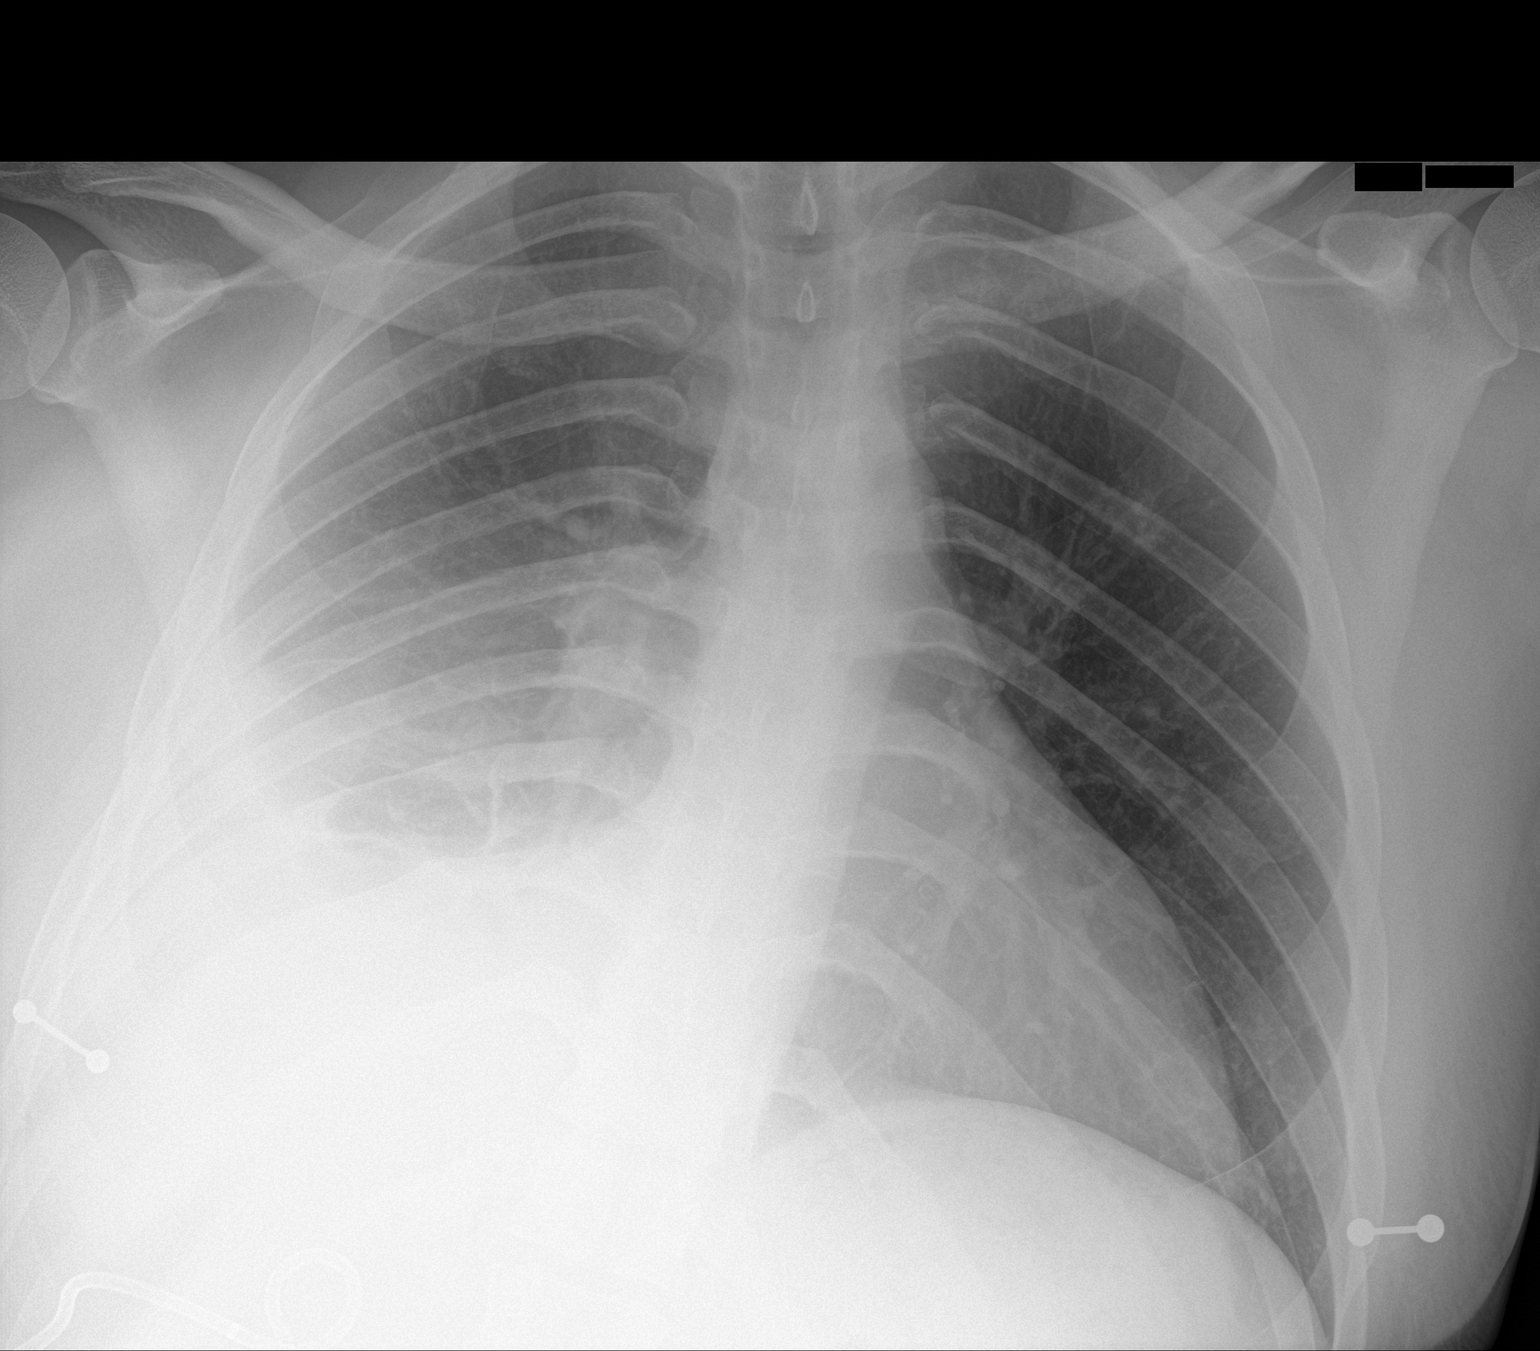

[1 of 1 positions shown; findings below may reference images not displayed]

FINDINGS: The cardiac silhouette, mediastinal and hilar contours are within
normal limits and stable. There is a persistent moderate-sized right
pleural effusion with overlying atelectasis. The left lung is clear.
The bony thorax is intact.
IMPRESSION: Persistent moderate-sized right pleural effusion with overlying
atelectasis.

## 2021-09-01 MED ORDER — DICLOFENAC SODIUM 1 % EX GEL: 4.0000 g | Freq: Four times a day (QID) | CUTANEOUS | Status: DC | PRN

## 2021-09-01 NOTE — Progress Notes (Signed)
? ? ?Referring Physician(s): ?Dahlstedt,S ? ?Supervising Physician: Gilmer Mor ? ?Patient Status:  [facility] - In-pt ? ?Chief Complaint: ? ?Right flank/chest pain, perinephric/retroperitoneal abscess ? ?Subjective: ?Pt states that he is having some rt upper chest discomfort, more so with deep breathing along with some persistent but sl improved rt flank discomfort; denies worsening dyspnea, N/V ? ? ?Allergies: ?Patient has no known allergies. ? ?Medications: ?Prior to Admission medications   ?Medication Sig Start Date End Date Taking? Authorizing Provider  ?FLUoxetine (PROZAC) 20 MG capsule Take 20 mg by mouth daily as needed (anxiety attacks).   Yes [provider]  ?insulin aspart protamine- aspart (NOVOLOG MIX 70/30) (70-30) 100 UNIT/ML injection Inject 0.4 mLs (40 Units total) into the skin with breakfast, with lunch, and with evening meal. ?Patient taking differently: Inject 30 Units into the skin with breakfast, with lunch, and with evening meal. 08/09/21  Yes Mesner, Barbara Cower, MD  ?insulin detemir (LEVEMIR) 100 UNIT/ML injection Inject 0.3 mLs (30 Units total) into the skin daily. ?Patient taking differently: Inject 40 Units into the skin at bedtime. 08/09/21  Yes Mesner, Barbara Cower, MD  ?pregabalin (LYRICA) 300 MG capsule Take 1 capsule (300 mg total) by mouth in the morning, at noon, and at bedtime. 08/09/21 09/08/21 Yes Mesner, Barbara Cower, MD  ?QUEtiapine (SEROQUEL) 100 MG tablet Take 100 mg by mouth at bedtime as needed (anxiety).   Yes [provider]  ?traZODone (DESYREL) 150 MG tablet Take 1 tablet (150 mg total) by mouth at bedtime. 08/09/21  Yes Mesner, Barbara Cower, MD  ? ? ? ?Vital Signs: ?BP 114/78 (BP Location: Right Arm)   Pulse 86   Temp 98.4 ?F (36.9 ?C) (Oral)   Resp 16   Ht 6' (1.829 m)   Wt 216 lb 14.9 oz (98.4 kg)   SpO2 94%   BMI 29.42 kg/m?  ? ?Physical exam: awake/alert; rt flank drain intact, insertion site ok, sl tender, output  ?65 cc yesterday, 10 cc today bloody fluid; drain flushed  without difficulty ? ? ?Imaging: ?CT ABDOMEN PELVIS WO CONTRAST ? ?Result Date: 08/30/2021 ?CLINICAL DATA:  Retroperitoneal abscess. EXAM: CT ABDOMEN AND PELVIS WITHOUT CONTRAST TECHNIQUE: Multidetector CT imaging of the abdomen and pelvis was performed following the standard protocol without IV contrast. RADIATION DOSE REDUCTION: This exam was performed according to the departmental dose-optimization program which includes automated exposure control, adjustment of the mA and/or kV according to patient size and/or use of iterative reconstruction technique. COMPARISON:  August 25, 2021 FINDINGS: Lower chest: Moderate right pleural effusion with adjacent relaxation atelectasis is similar prior. Round nodular consolidation in the perifissural posterior left upper lobe measures 14 mm, unchanged. Hepatobiliary: Unremarkable noncontrast appearance of the hepatic parenchyma. Gallbladder is decompressed. No biliary ductal dilation. Pancreas: No pancreatic ductal dilation or evidence of acute inflammation. Spleen: No splenomegaly. Adrenals/Urinary Tract: Left adrenal gland appears normal. Right adrenal gland is not well identified. Interval insertion of a right posterior approach pigtail drainage catheter in the renal/perinephric gas and fluid collection with decrease in size of the collection now measuring 8.2 x 6.9 x 1.9 cm previously 8.3 x 8.0 x 3.9 cm. No hydronephrosis. No renal, ureteral or bladder calculi identified. Urinary bladder is unremarkable for degree of distension. Stomach/Bowel: No enteric contrast was administered. Stomach is unremarkable for degree of distension. No pathologic dilation of small or large bowel. Large volume of formed stool throughout the colon. Vascular/Lymphatic: Normal caliber abdominal aorta. No pathologically enlarged abdominal or pelvic lymph nodes. Scattered prominent abdominopelvic lymph nodes appears similar  prior. Reproductive: Prostate is unremarkable. Other: Trace free fluid in the  right pericolic gutter and pelvis likely reflect sequela of the renal abscess. Musculoskeletal: No acute osseous abnormality. IMPRESSION: 1. Interval insertion of a right posterior approach pigtail drainage catheter into the renal/perinephric abscess, with interval decreased abscess volume. 2. Moderate right pleural effusion with adjacent relaxation atelectasis is similar prior. 3. Round nodular consolidation in the perifissural posterior left upper lobe measures 14 mm, unchanged. 4. Large volume of formed stool throughout the colon. Electronically Signed   By: Maudry Mayhew M.D.   On: 08/30/2021 12:30   ? ?Labs: ? ?CBC: ?Recent Labs  ?  08/28/21 ?0450 08/29/21 ?0434 08/30/21 ?0434 09/01/21 ?0506  ?WBC 6.7 6.3 6.5 6.0  ?HGB 7.8* 8.1* 8.0* 9.5*  ?HCT 23.1* 23.9* 24.7* 29.4*  ?PLT 278 344 400 565*  ? ? ?COAGS: ?No results for input(s): INR, APTT in the last 8760 hours. ? ?BMP: ?Recent Labs  ?  08/28/21 ?0450 08/29/21 ?0434 08/30/21 ?0434 09/01/21 ?0506  ?NA 133* 135 134* 135  ?K 4.3 4.5 4.4 4.4  ?CL 98 99 98 97*  ?CO2 24 28 27 27   ?GLUCOSE 216* 208* 153* 122*  ?BUN 17 18 19  22*  ?CALCIUM 8.7* 8.8* 9.1 9.5  ?CREATININE 1.69* 1.60* 1.46* 1.69*  ?GFRNONAA 54* 58* >60 54*  ? ? ?LIVER FUNCTION TESTS: ?Recent Labs  ?  08/17/21 ?1411 08/20/21 ?1450 08/24/21 ?10/20/21 08/26/21 ?0300 08/27/21 ?08/28/21 08/28/21 ?0450 08/29/21 ?0434 08/30/21 ?08/31/21 09/01/21 ?5361  ?BILITOT 0.5 0.1* 0.6 0.5  --   --   --   --   --   ?AST 19 16 21 25   --   --   --   --   --   ?ALT 10 8 12 15   --   --   --   --   --   ?ALKPHOS 147* 113 150* 173*  --   --   --   --   --   ?PROT 8.7* 8.0 9.3* 6.7  --   --   --   --   --   ?ALBUMIN 3.1* 2.9* 3.2* 2.3*   < > 2.2* 2.1* 2.4* 2.8*  ? < > = values in this interval not displayed.  ? ? ?Assessment and Plan: ?Pt with hx DM/neuropathy, recent encephalopathy, ambulatory dysfunction, chronic pain syndrome, rt pleural effusion, rt perinephric abscess; s/p 12 fr drain placement to bag on 4/13; afebrile; WBC nl; hgb  9.5(8), creat 1.69(1.46), drain fluid cx- e coli; cont drain irrigation, labs checks, close output monitoring; latest CT on 4/17 revealed:  ?1. Interval insertion of a right posterior approach pigtail drainage ?catheter into the renal/perinephric abscess, with interval decreased ?abscess volume. ?2. Moderate right pleural effusion with adjacent relaxation ?atelectasis is similar prior. ?3. Round nodular consolidation in the perifissural posterior left ?upper lobe measures 14 mm, unchanged. ?4. Large volume of formed stool throughout the colon. ? ?Pt will be set up for IR clinic f/u in 2 weeks after dc ; other plans as per TRH/urology ? ?Electronically Signed: ? , PA-C ?09/01/2021, 11:23 AM ? ? ?I spent a total of 15 Minutes at the the patient's bedside AND on the patient's hospital floor or unit, greater than 50% of which was counseling/coordinating care for right perinephric abscess drain ? ? ? ?Patient ID: Victor Matthews, male   DOB: [birth year], 36 y.o.   MRN: 09/03/2021 ? ?

## 2021-09-01 NOTE — Progress Notes (Signed)
?PROGRESS NOTE ?Victor Matthews  YBW:[id number] DOB: 04/24/1986 DOA: 08/24/2021 ?PCP: System, Provider Not In  ? ?Brief Narrative/Hospital Course: ?36 year old M with PMH of DM-1 with peripheral neuropathy, chronic pain on chronic opiate, tobacco use disorder, anxiety, BPD, E. coli bacteremia and COVID-19 infection in 06/2021, and recent hospitalization from 4/4-4/7 for hyperglycemia and ambulatory dysfunction when he left AMA returned to [facility] ED with hyperglycemia and falls x2, and admitted for hyperglycemia to 715, AKI, acute metabolic encephalopathy, possible pneumonia and uncontrolled hypertension.  He was started on insulin drip, IV ceftriaxone and IV fluid.  He also had elevated D-dimer in ED- s/p CTA chest -showed right renal and perinephric abscess, moderate right pleural effusion and possible septic emboli to RUL but negative for PE.  Antibiotics broadened to IV Zosyn.  Urology consulted.  CT abdomen and pelvis obtained confirmed renal abscess.  IR consulted, and drain placed with bloody output.  Abscess culture with ESBL E. Coli. ID recommended Invanz through 4/26 in house.  PT OT seen and recommending skilled nursing facility, TOC following  ?  ?Subjective: ?Seen and examined this morning.  Resting comfortably, complains of intermittent right-sided pain ?Overnight no fever BP stable. ? ?Assessment and Plan: ? ?Principal Problem: ?  Uncontrolled type 1 diabetes mellitus with hyperglycemia, with long-term current use of insulin (HCC) ?Active Problems: ?  Sepsis (HCC) ?  Fall due to ambulatory dysfunction ?  Atypical chest pain ?  Acute metabolic encephalopathy ?  Pleural effusion on right ?  Possible RUL septic emboli ?  Chronic pain syndrome ?  Elevated serum creatinine ?  History of anxiety/bipolar disorder ?  Normocytic anemia ?  Grief ?  Dehydration ?  Fever ?  Elevated d-dimer ?  Hypokalemia and hyponatremia ?  Kidney, perinephric abscess ?  Renal abscess, right ?  Tobacco use disorder ?  ?Uncontrolled  type 1 diabetes mellitus with hyperglycemia, with long-term current use of insulin: ?Likely from poor compliance.no evidence of HHS or DKA.  Blood sugar at this time fairly controlled on Levemir 25 units twice daily, NovoLog 4 units 3 times daily and SSI. On Levemir 30 units daily and NovoLog 70/30 at 40 units 3 times daily at home??-On discharge would recommend only Levemir or mix insulin. A1c 14.6% indicating poor outpatient control.  Diabetic coordinator following ?Recent Labs  ?Lab 01-Sep-2021 ?1222 Sep 01, 2021 ?1640 09-01-2021 ?09-01-2124 08/31/21 ?0727 08/31/21 ?1149  ?GLUCAP 169* 141* 139* 88 108*  ? ? ?Sepsis  ?Right renal and perinephric abscess with pneumoperitoneum from esbl e coli: ?Patient had CTA C/A/P -showing abscess pleural effusion septic emboli. recent E. coli bacteremia 2 months ago.  Blood culture no growth so far but after IV antibiotics.  MRSA PCR negative.  Last fever spike on 4/15, patient is a S/P IR drain  and resulting fluid culture with ESBL E. Coli.  Antibiotics switched to Invanz and to be continued through 4/26 as per ID.  Not a candidate for home infusion.  TOC looking into placement. ?Tendency to leave against AMA threatened to leave AMA 4/17-patient has been counseled extensively he needs to complete IV antibiotics or else he will risk worsening of infection which could lead into sepsis, multiorgan failure leading to his disability and death . ?LAST CT ABD 02-Sep-2022- 1. Interval insertion of a right posterior approach pigtail drainage catheter into the renal/perinephric abscess, with interval decreased abscess volume. 2. Moderate right pleural effusion with adjacent relaxation atelectasis is similar prior. 3. Round nodular consolidation in the perifissural posterior left upper lobe measures  14 mm, unchanged. 4. Large volume of formed stool throughout the colon ? ?Moderate right pleural effusion-Per IR too small to be drained, likely in the setting of sepsis and reactive effusion ?Possible septic  emboli OEU:MPNTIR from renal abscess?.  Blood culture no growth.  Continue antibiotics as above ? ?Acute metabolic encephalopathy likely from polypharmacy and infection.  Resolved ?Atypical chest pain serial troponin and EKG have been negative likely from renal abscess and pleural effusion its mostly on the right side ? ?Fall due to ambulatory dysfunction ongoing since January 2023, at risk for falls due to diabetic neuropathy and polypharmacy.  Multiple imaging including MRI brain, C, T and L spines, and CT head and cervical spine without significant finding to explain his symptoms.  Grossly nonfocal on exam.  Continue PT OT recommending skilled nursing facility.  Keep on fall precaution ? ?Grief:He states he recently lost his father.  Cont emotional support ? ?Normocytic anemia:Baseline Hgb 10-11.  Slight drop likely in the setting of hemodilution and some perinephric hemorrhage, likely from chronic illness as well.  Monitor H&H overall stable and improving ?Recent Labs  ?Lab 08/27/21 ?1311 08/28/21 ?0450 08/29/21 ?0434 08/30/21 ?0434 09/01/21 ?0506  ?HGB 9.2* 7.8* 8.1* 8.0* 9.5*  ?HCT 27.1* 23.1* 23.9* 24.7* 29.4*  ?  ?History of anxiety/BPD: ?Anxious and overwhelmed about prolonged hospitalization.  Repeatedly asking to go outside for fresh air but he is at risk of fall due to ambulatory dysfunction.  Threatening to leave AMA at times.  We will continue the current home Prozac trazodone Seroquel, he declined psychiatry consultation for further help.  Continue  on new- Klonopin 0.5 twice daily and Atarax. ? ?Elevated serum creatinine:Creatinine 1.4-1.7. monitor ?Recent Labs  ?Lab 08/27/21 ?0307 08/28/21 ?0450 08/29/21 ?0434 08/30/21 ?0434 09/01/21 ?0506  ?BUN 14 17 18 19  22*  ?CREATININE 1.72* 1.69* 1.60* 1.46* 1.69*  ?  ?Chronic pain syndrome:On  MS Contin, Percocet and Lyrica per narcotic database. Last fill on 07/12/2021.  Reportedly could not fill last month due to illness and hospitalization.  Continue home  Lyrica at reduced dose along with MS Contin, OxyContin-and pain control, Tylenol tramadol.  Minimize narcotics. ? ?Tobacco use disorder: Encourage cessation previously.Declined nicotine patch. ? ?Elevated d-dimer: LE Doppler negative for DVT.  CTA chest negative for PE but right perinephric abscess.   ? ?Dehydration: Likely from hyperglycemia.  Resolved. ? ?Nutritional status increased nutrient needs ?Nutrition Problem: Increased nutrient needs ?Etiology: acute illness ?Signs/Symptoms: estimated needs ?Interventions: Premier Protein, MVI ? ?DVT prophylaxis: Not much ambulatory at risk of DVT resume Lovenox Place and maintain sequential compression device Start: 08/26/21 1753 ?Code Status:   Code Status: Full Code ?Family Communication: plan of care discussed with patient at bedside. ?Patient status is: inpatient Level of care: Med-Surg  ?Remains inpatient because: Ongoing IV antibiotics through 4/26 ?Patient currently not stable ? ?Dispo: The patient is from: home ?           Anticipated disposition: SNF once bed available ? ?Mobility Assessment (last 72 hours)   ? ? Mobility Assessment   ? ? Row Name 08/31/21 2020 08/31/21 1200 08/31/21 0903 08/30/21 2021 08/30/21 1551  ? Does patient have an order for bedrest or is patient medically unstable No - Continue assessment -- No - Continue assessment No - Continue assessment --  ? What is the highest level of mobility based on the progressive mobility assessment? Level 5 (Walks with assist in room/hall) - Balance while stepping forward/back and can walk in room with assist -  Complete Level 5 (Walks with assist in room/hall) - Balance while stepping forward/back and can walk in room with assist - Complete Level 4 (Walks with assist in room) - Balance while marching in place and cannot step forward and back - Complete Level 3 (Stands with assist) - Balance while standing  and cannot march in place Level 3 (Stands with assist) - Balance while standing  and cannot march in  place  ? Is the above level different from baseline mobility prior to current illness? Yes - Recommend PT order -- Yes - Recommend PT order Yes - Recommend PT order --  ? ? Row Name 08/29/21 2001 08/29/21 [id number]

## 2021-09-01 NOTE — TOC Progression Note (Signed)
Transition of Care (TOC) - Progression Note  ? ?Patient Details  ?Name: Victor Matthews ?MRN: [id number] ?Date of Birth: Apr 17, 1986 ? ?Transition of Care (TOC) CM/SW Contact  ?Ewing Schlein, LCSW ?Phone Number: ?09/01/2021, 12:30 PM ? ?Clinical Narrative: [facility]. CSW called [facility] to cancel insurance authorization. CSW followed up with Delorise Shiner in admissions at [facility]. Per Delorise Shiner, she will need to review the patient and his medications before she can offer a bed to the patient. CSW faxed out patient to additional SNFs as patient's age will be a barrier to placement. CSW updated hospitalist. TOC to follow. ? ?Expected Discharge Plan: Skilled Nursing Facility ?Barriers to Discharge: Continued Medical Work up ? ?Expected Discharge Plan and Services ?Expected Discharge Plan: Skilled Nursing Facility ?Living arrangements for the past 2 months: Single Family Home ? ?Readmission Risk Interventions ? ?  08/25/2021  ?  3:07 PM  ?Readmission Risk Prevention Plan  ?Transportation Screening Complete  ?PCP or Specialist Appt within 5-7 Days Complete  ?PCP or Specialist Appt within 3-5 Days Not Complete  ?Not Complete comments Patient will discharge to SNF.  ?HRI or Home Care Consult Complete  ?Social Work Consult for Recovery Care Planning/Counseling Complete  ?Palliative Care Screening Not Applicable  ?Medication Review Oceanographer) Complete  ? ? ?

## 2021-09-01 NOTE — Care Management Important Message (Signed)
Important Message ? ?Patient Details IM Letter placed in Patients room. ?Name: Victor Matthews ?MRN: [id number] ?Date of Birth: 03/07/1986 ? ? ?Medicare Important Message Given:  Yes ? ? ? ? ?Caren Macadam ?09/01/2021, 3:07 PM ?

## 2021-09-01 NOTE — Progress Notes (Signed)
?   09/01/21 1311  ?Family/Significant other security password  ?Patient security password Victor [email or web address]  ? ? ?

## 2021-09-01 NOTE — Progress Notes (Signed)
ANTICOAGULATION CONSULT NOTE - Follow Up Consult ? ?Pharmacy Consult for Enoxaparin ?Indication: VTE prophylaxis ? ?No Known Allergies ? ?Patient Measurements: ?Height: 6' (182.9 cm) ?Weight: 98.4 kg (216 lb 14.9 oz) ?IBW/kg (Calculated) : 77.6 ? ?Vital Signs: ?Temp: 98.4 ?F (36.9 ?C) (04/19 [id number]) ?Temp Source: Oral (04/19 [id number]) ?BP: 114/78 (04/19 [id number]) ?Pulse Rate: 86 (04/19 0648) ? ?Labs: ?Recent Labs  ?  08/30/21 ?0434 09/01/21 ?0506  ?HGB 8.0* 9.5*  ?HCT 24.7* 29.4*  ?PLT 400 565*  ?CREATININE 1.46* 1.69*  ? ? ?Estimated Creatinine Clearance: 74.8 mL/min (A) (by C-G formula based on SCr of 1.69 mg/dL (H)). ? ? ?Medications:  ?Medications Prior to Admission  ?Medication Sig Dispense Refill Last Dose  ? FLUoxetine (PROZAC) 20 MG capsule Take 20 mg by mouth daily as needed (anxiety attacks).   08/24/2021  ? insulin aspart protamine- aspart (NOVOLOG MIX 70/30) (70-30) 100 UNIT/ML injection Inject 0.4 mLs (40 Units total) into the skin with breakfast, with lunch, and with evening meal. (Patient taking differently: Inject 30 Units into the skin with breakfast, with lunch, and with evening meal.) 10 mL 11 08/23/2021  ? insulin detemir (LEVEMIR) 100 UNIT/ML injection Inject 0.3 mLs (30 Units total) into the skin daily. (Patient taking differently: Inject 40 Units into the skin at bedtime.) 10 mL 11 08/23/2021  ? pregabalin (LYRICA) 300 MG capsule Take 1 capsule (300 mg total) by mouth in the morning, at noon, and at bedtime. 90 capsule 0 08/23/2021  ? QUEtiapine (SEROQUEL) 100 MG tablet Take 100 mg by mouth at bedtime as needed (anxiety).   08/23/2021  ? traZODone (DESYREL) 150 MG tablet Take 1 tablet (150 mg total) by mouth at bedtime. 30 tablet 0 08/23/2021  ? ? ?Assessment: ?Pharmacy consulted to dose enoxaparin for VTE prophylaxis for this 36 yo M with PMH of DM-1 with peripheral neuropathy, chronic pain on chronic opiate, tobacco use disorder, anxiety, BPD, E. coli bacteremia and COVID-19 infection in 06/2021, and recent  hospitalization from 4/4-4/7 for hyperglycemia and ambulatory dysfunction when he left AMA returned to [facility] ED with hyperglycemia and falls x2, and admitted for hyperglycemia to 715, AKI, acute metabolic encephalopathy, possible pneumonia and uncontrolled hypertension.   ? ?4/11 Elevated d-dimer - LE Doppler negative for DVT.  CTA chest negative for PE but right perinephric abscess, possible septic emboli and right pleural effusion. ? ?Estimated CrCl greater than 30 ml/min ?BMI less than 30 kg/m2 ? ?Goal of Therapy:  ?DVT prophylaxis ?Monitor platelets by anticoagulation protocol: Yes ?  ?Plan:  ?Lovenox 40 mg subq daily ?Pharmacy will formally sign off and continue to monitor CBC, signs/symptoms of bleeding ? ? ?Royetta Asal, PharmD, BCPS ?Clinical Pharmacist ?[place] ?Please utilize Amion for appropriate phone number to reach the unit pharmacist ([place]) ?09/01/2021 10:38 AM ? ? ? ?

## 2021-09-01 NOTE — Plan of Care (Signed)
  Problem: Pain Managment: Goal: General experience of comfort will improve Outcome: Progressing   Problem: Safety: Goal: Ability to remain free from injury will improve Outcome: Progressing   Problem: Skin Integrity: Goal: Risk for impaired skin integrity will decrease Outcome: Progressing   

## 2021-09-02 ENCOUNTER — Inpatient Hospital Stay (HOSPITAL_COMMUNITY): Payer: Medicare Other

## 2021-09-02 LAB — RESP PANEL BY RT-PCR (FLU A&B, COVID) ARPGX2
Influenza A by PCR: NEGATIVE
Influenza B by PCR: NEGATIVE
SARS Coronavirus 2 by RT PCR: NEGATIVE

## 2021-09-02 LAB — CBC
HCT: 24.4 % — ABNORMAL LOW (ref 39.0–52.0)
Hemoglobin: 8.1 g/dL — ABNORMAL LOW (ref 13.0–17.0)
MCH: 26.6 pg (ref 26.0–34.0)
MCHC: 33.2 g/dL (ref 30.0–36.0)
MCV: 80.3 fL (ref 80.0–100.0)
Platelets: 573 10*3/uL — ABNORMAL HIGH (ref 150–400)
RBC: 3.04 MIL/uL — ABNORMAL LOW (ref 4.22–5.81)
RDW: 14 % (ref 11.5–15.5)
WBC: 5.5 10*3/uL (ref 4.0–10.5)
nRBC: 0 % (ref 0.0–0.2)

## 2021-09-02 LAB — BODY FLUID CELL COUNT WITH DIFFERENTIAL
Eos, Fluid: 1 %
Lymphs, Fluid: 35 %
Monocyte-Macrophage-Serous Fluid: 32 % — ABNORMAL LOW (ref 50–90)
Neutrophil Count, Fluid: 32 % — ABNORMAL HIGH (ref 0–25)
Total Nucleated Cell Count, Fluid: 1895 cu mm — ABNORMAL HIGH (ref 0–1000)

## 2021-09-02 LAB — BASIC METABOLIC PANEL
Anion gap: 9 (ref 5–15)
BUN: 24 mg/dL — ABNORMAL HIGH (ref 6–20)
CO2: 28 mmol/L (ref 22–32)
Calcium: 9.3 mg/dL (ref 8.9–10.3)
Chloride: 98 mmol/L (ref 98–111)
Creatinine, Ser: 1.61 mg/dL — ABNORMAL HIGH (ref 0.61–1.24)
GFR, Estimated: 57 mL/min — ABNORMAL LOW (ref >60)
Glucose, Bld: 123 mg/dL — ABNORMAL HIGH (ref 70–99)
Potassium: 4.2 mmol/L (ref 3.5–5.1)
Sodium: 135 mmol/L (ref 135–145)

## 2021-09-02 LAB — PROTEIN, PLEURAL OR PERITONEAL FLUID: Total protein, fluid: 6.3 g/dL

## 2021-09-02 LAB — GLUCOSE, CAPILLARY
Glucose-Capillary: 133 mg/dL — ABNORMAL HIGH (ref 70–99)
Glucose-Capillary: 148 mg/dL — ABNORMAL HIGH (ref 70–99)
Glucose-Capillary: 224 mg/dL — ABNORMAL HIGH (ref 70–99)

## 2021-09-02 LAB — LACTATE DEHYDROGENASE, PLEURAL OR PERITONEAL FLUID: LD, Fluid: 337 U/L — ABNORMAL HIGH (ref 3–23)

## 2021-09-02 IMAGING — DX DG CHEST 1V
1 series · 1 of 1 positions shown · non-contrast
Comparison: [DATE]

CLINICAL DATA: Right pleural effusion. Status post right
thoracentesis.

EXAM:
CHEST  1 VIEW

[chest ap]
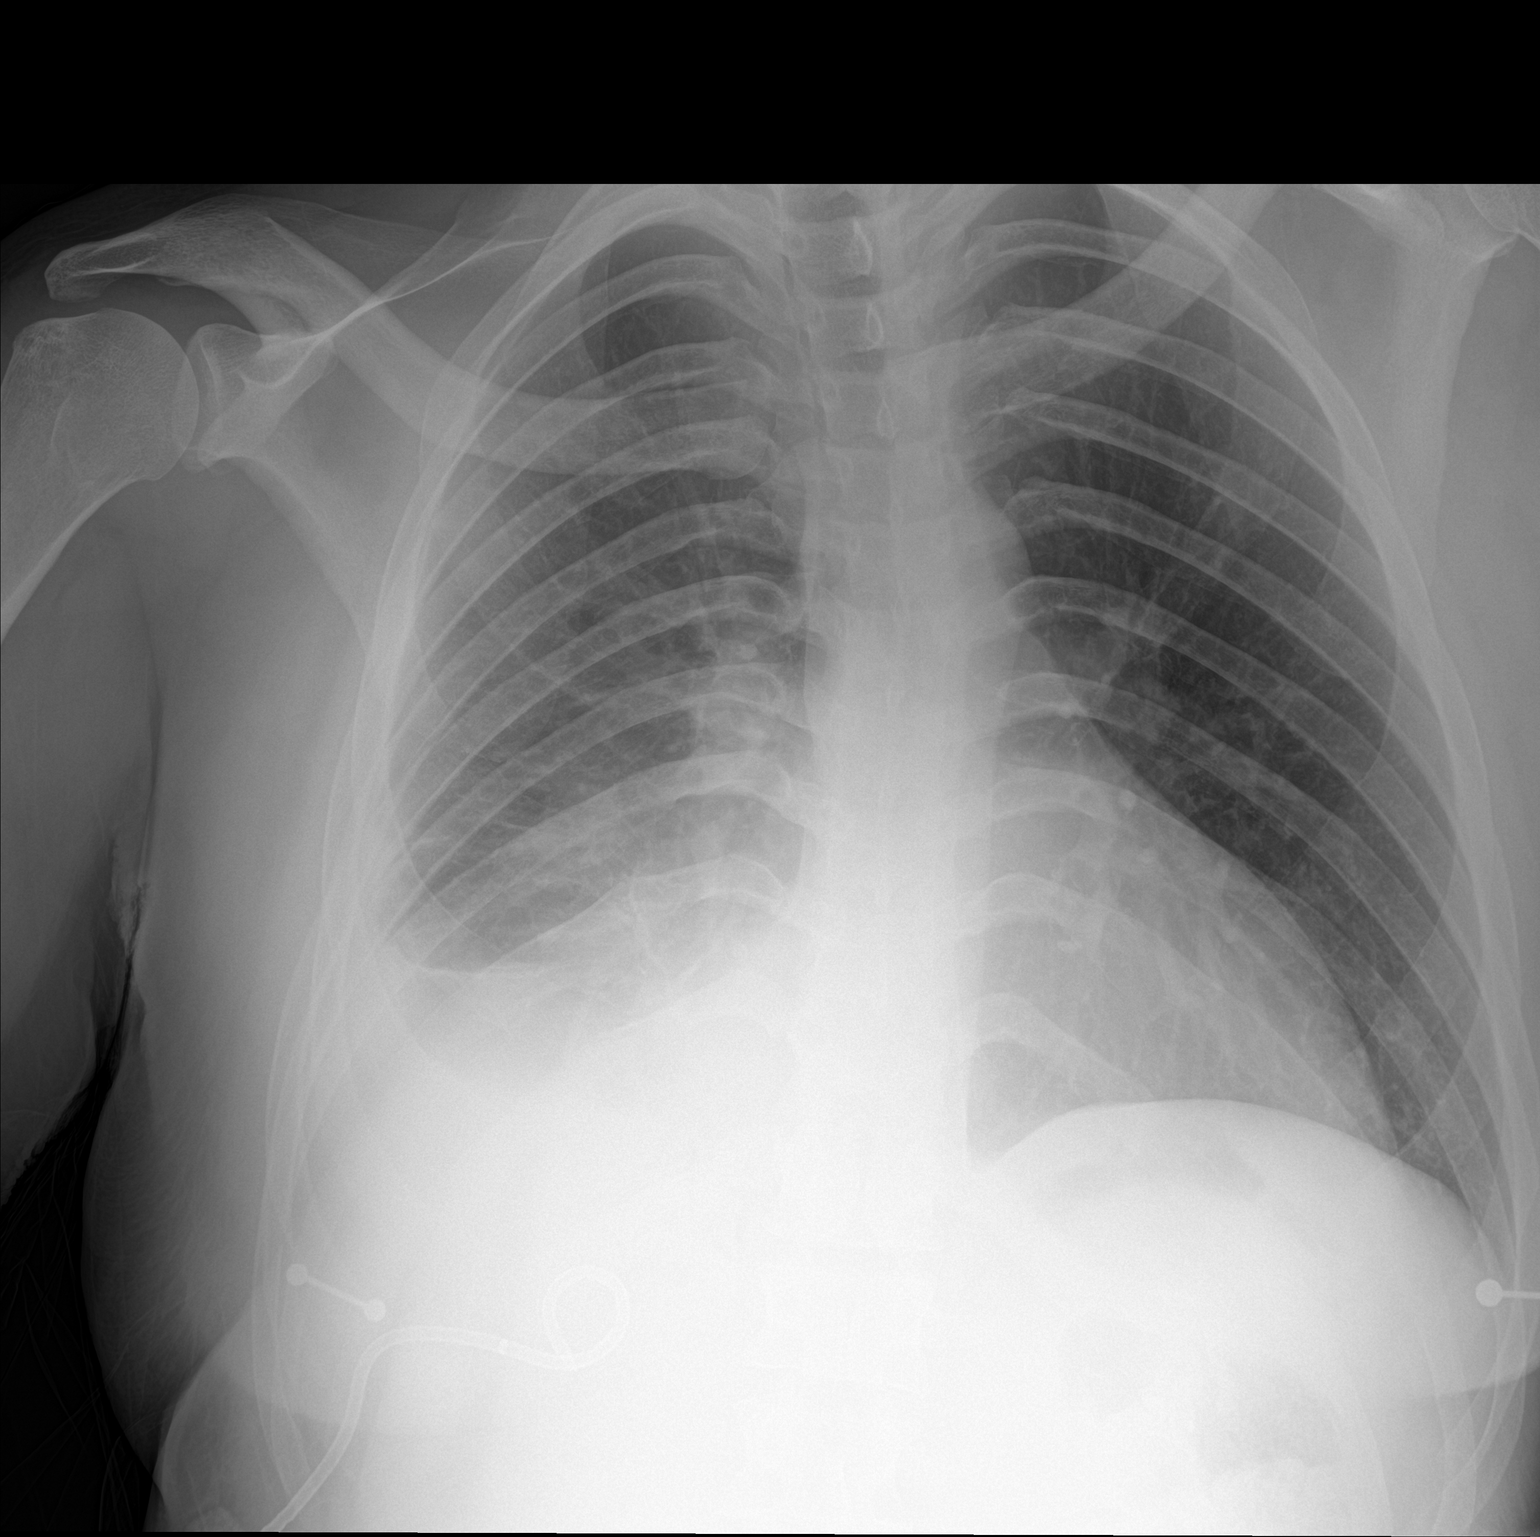

[1 of 1 positions shown; findings below may reference images not displayed]

FINDINGS: Small to moderate right pleural effusion shows decreased in size
since previous study. Decreased atelectasis noted in the right lung
base. No pneumothorax visualized. Right lung is clear. Heart size is
within normal limits. Right upper quadrant pigtail catheter is again
noted.
IMPRESSION: Decreased right pleural effusion and right basilar atelectasis. No
pneumothorax visualized.

## 2021-09-02 IMAGING — US US THORACENTESIS ASP PLEURAL SPACE W/IMG GUIDE
1 series · 6 of 6 positions shown · non-contrast
Comparison: none

INDICATION: Patient with history of right perinephric abscess with prior
drainage, right pleural effusion, dyspnea; request received for
diagnostic and therapeutic right thoracentesis.

[Series 1: us thoracentesis asp pleural s mc & wl · 6 of 6 slices shown]
[im 1/6]
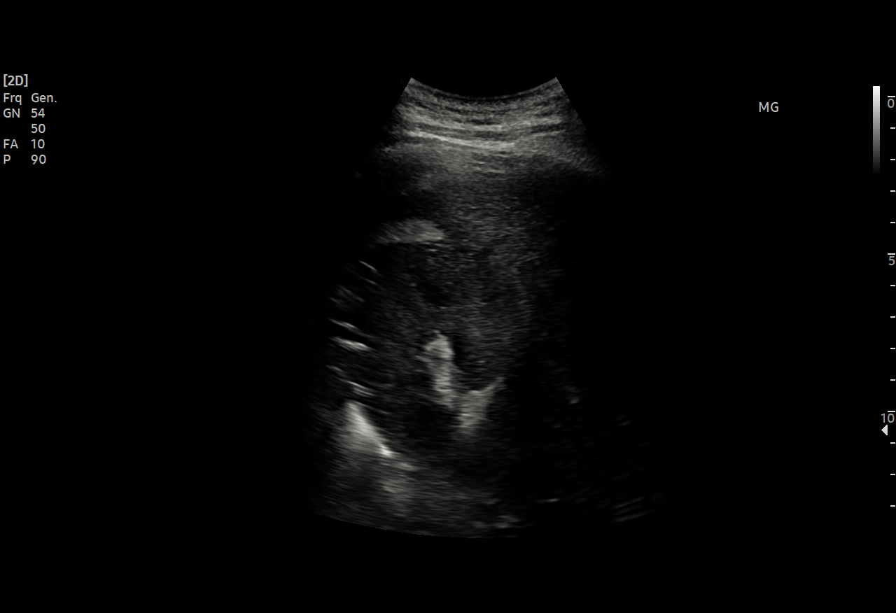
[im 2/6]
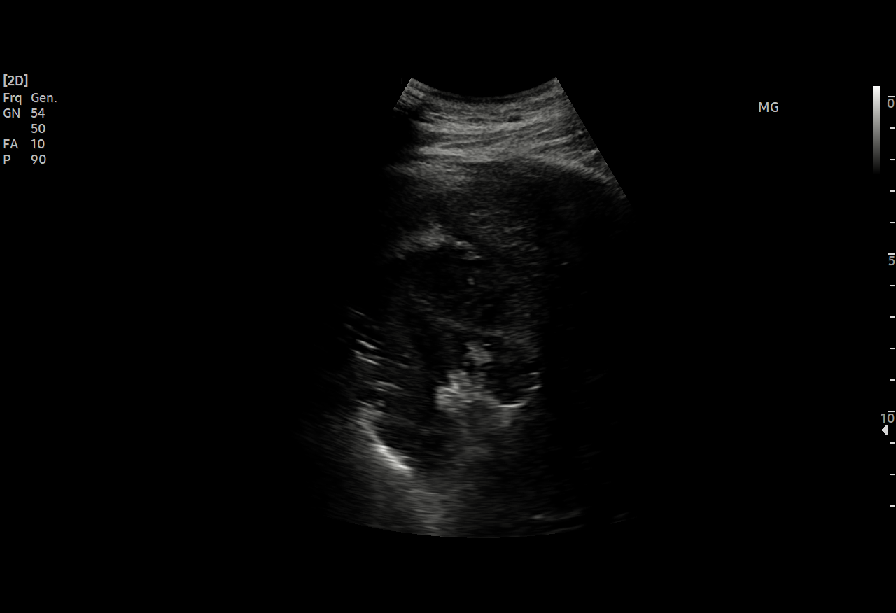
[im 3/6]
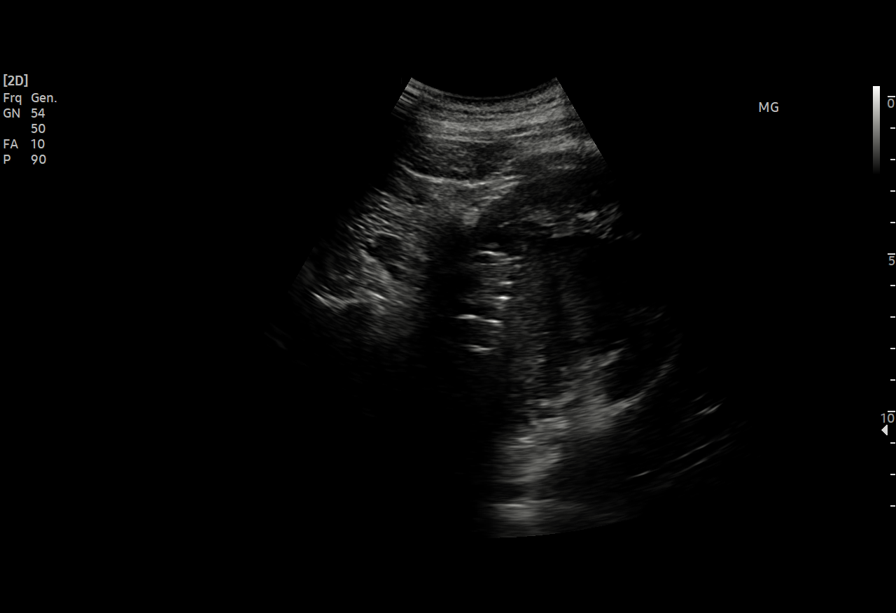
[im 4/6]
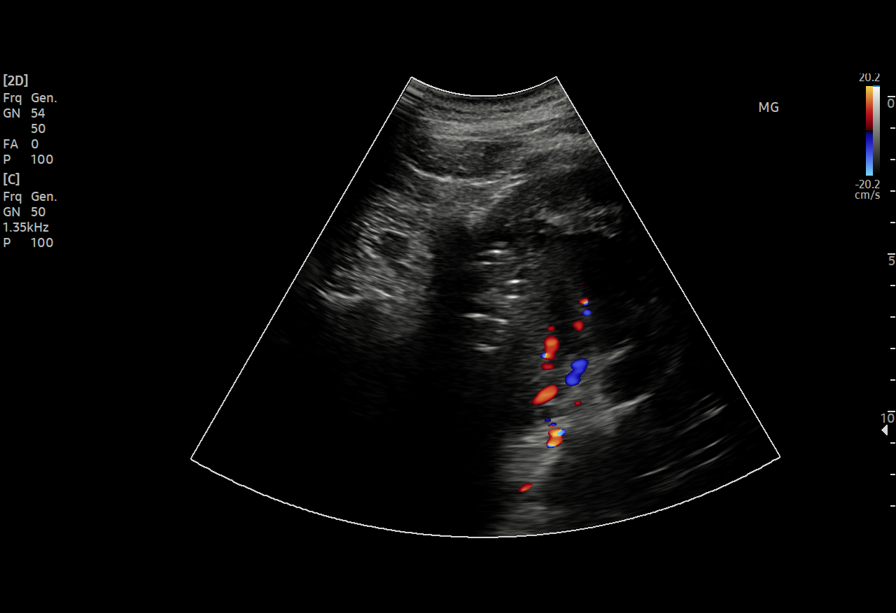
[im 5/6]
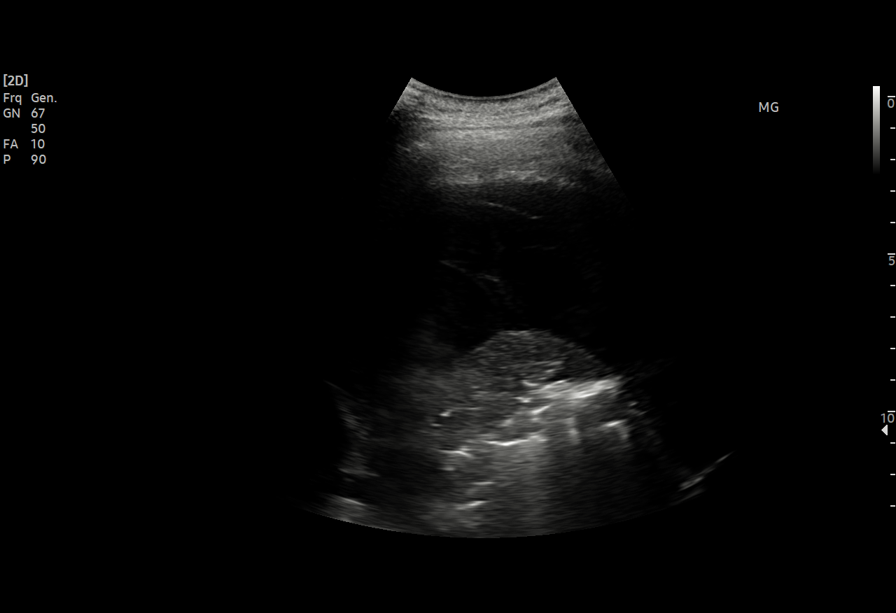
[im 6/6]
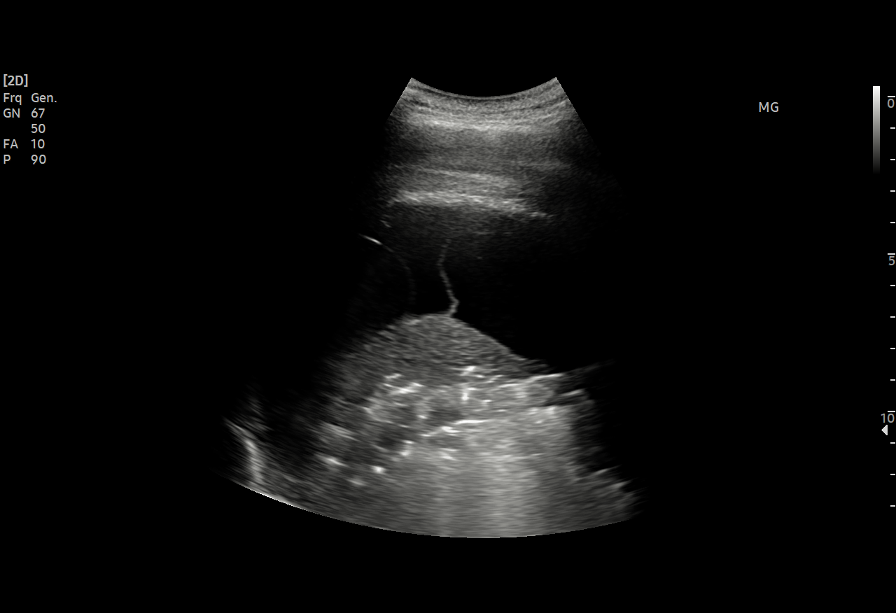

[6 of 6 positions shown; findings below may reference images not displayed]

EXAM:
ULTRASOUND GUIDED DIAGNOSTIC AND THERAPEUTIC RIGHT THORACENTESIS

MEDICATIONS:
10 mL 1% lidocaine

COMPLICATIONS:
None immediate.

PROCEDURE:
An ultrasound guided thoracentesis was thoroughly discussed with the
patient and questions answered. The benefits, risks, alternatives
and complications were also discussed. The patient understands and
wishes to proceed with the procedure. Written consent was obtained.

Ultrasound was performed to localize and mark an adequate pocket of
fluid in the right chest. The area was then prepped and draped in
the normal sterile fashion. 1% Lidocaine was used for local
anesthesia. Under ultrasound guidance a 6 Fr Safe-T-Centesis
catheter was introduced. Thoracentesis was performed. The catheter
was removed and a dressing applied.
FINDINGS: A total of approximately 520 cc of turbid, bloody fluid was removed.
Samples were sent to the laboratory as requested by the clinical
team.
IMPRESSION: Successful ultrasound guided diagnostic and therapeutic right
thoracentesis yielding 520 cc of pleural fluid.

## 2021-09-02 MED ORDER — PREGABALIN 150 MG PO CAPS: 150.0000 mg | ORAL_CAPSULE | Freq: Three times a day (TID) | ORAL | 0 refills | Status: DC

## 2021-09-02 MED ORDER — HYDROXYZINE HCL 25 MG PO TABS: 25.0000 mg | ORAL_TABLET | Freq: Four times a day (QID) | ORAL | 0 refills | Status: DC | PRN

## 2021-09-02 MED ORDER — OXYCODONE-ACETAMINOPHEN 5-325 MG PO TABS: 1.0000 | ORAL_TABLET | Freq: Four times a day (QID) | ORAL | 0 refills | Status: DC | PRN

## 2021-09-02 MED ORDER — INSULIN DETEMIR 100 UNIT/ML ~~LOC~~ SOLN: 25.0000 [IU] | Freq: Two times a day (BID) | SUBCUTANEOUS | 11 refills | Status: DC

## 2021-09-02 MED ORDER — SENNOSIDES-DOCUSATE SODIUM 8.6-50 MG PO TABS: 1.0000 | ORAL_TABLET | Freq: Two times a day (BID) | ORAL | Status: DC

## 2021-09-02 MED ORDER — DICLOFENAC SODIUM 1 % EX GEL: 4.0000 g | Freq: Four times a day (QID) | CUTANEOUS | Status: DC | PRN

## 2021-09-02 MED ORDER — GLUCERNA SHAKE PO LIQD
237.0000 mL | Freq: Two times a day (BID) | ORAL | 0 refills | Status: DC
Start: 2021-09-02 — End: 2021-09-30

## 2021-09-02 MED ORDER — SODIUM CHLORIDE 0.9 % IV SOLN: 1.0000 g | INTRAVENOUS | Status: AC

## 2021-09-02 MED ORDER — CLONAZEPAM 0.5 MG PO TABS
0.2500 mg | ORAL_TABLET | Freq: Two times a day (BID) | ORAL | 0 refills | Status: DC
Start: 2021-09-02 — End: 2021-09-30

## 2021-09-02 MED ORDER — INSULIN ASPART 100 UNIT/ML IJ SOLN: 4.0000 [IU] | Freq: Three times a day (TID) | INTRAMUSCULAR | 11 refills | Status: DC

## 2021-09-02 MED ORDER — LIDOCAINE HCL 1 % IJ SOLN
INTRAMUSCULAR | Status: AC
  Administered 2021-09-02: 20 mL
  Filled 2021-09-02: qty 20

## 2021-09-02 NOTE — Progress Notes (Signed)
?PROGRESS NOTE ?Victor Victor Matthews  WUJ:[id number] DOB: 03/01/1986 DOA: 08/24/2021 ?PCP: System, Provider Not In  ? ?Brief Narrative/Hospital Course: ?36 year old M with PMH of DM-1 with peripheral neuropathy, chronic pain on chronic opiate, tobacco use disorder, anxiety, BPD, E. coli bacteremia and COVID-19 infection in 06/2021, and recent hospitalization from 4/4-4/7 for hyperglycemia and ambulatory dysfunction when he left AMA returned to [facility] ED with hyperglycemia and falls x2, and admitted for hyperglycemia to 715, AKI, acute metabolic encephalopathy, possible pneumonia and uncontrolled hypertension.  He was started on insulin drip, IV ceftriaxone and IV fluid.  He also had elevated D-dimer in ED- s/p CTA chest -showed right renal and perinephric abscess, moderate right pleural effusion and possible septic emboli to RUL but negative for PE.  Antibiotics broadened to IV Zosyn.  Urology consulted.  CT abdomen and pelvis obtained confirmed renal abscess.  IR consulted, and drain placed with bloody output.  Abscess culture with ESBL E. Coli. ID recommended Invanz through 4/26 in house.  PT OT seen and recommending skilled nursing facility, TOC following  ?  ?Subjective: ?Seen this morning.  No new complaints.  Some right-sided chest discomfort flank pain ?Tmax 99.  Overnight no acute changes. ?Cxr 4/19 showed persistent moderate right pleural effusion ?Asking if he can go outside ?Assessment and Plan: ? ?Principal Problem: ?  Uncontrolled type 1 diabetes mellitus with hyperglycemia, with long-term current use of insulin (HCC) ?Active Problems: ?  Sepsis (HCC) ?  Fall due to ambulatory dysfunction ?  Atypical chest pain ?  Acute metabolic encephalopathy ?  Pleural effusion on right ?  Possible RUL septic emboli ?  Chronic pain syndrome ?  Elevated serum creatinine ?  History of anxiety/bipolar disorder ?  Normocytic anemia ?  Grief ?  Dehydration ?  Fever ?  Elevated d-dimer ?  Hypokalemia and hyponatremia ?  Kidney,  perinephric abscess ?  Renal abscess, right ?  Tobacco use disorder ?  ?Uncontrolled T1DM with hyperglycemia, with long-term current use of insulin: ?Likely from poor compliance.no evidence of HHS or DKA.  Overall stable on current insulin regimen followed by DM coordinator.  Continue Levemir 25 units twice daily, NovoLog 4 units 3 times daily and SSI. At home on Levemir 30 units daily and NovoLog 70/30 at 40 units 3 times daily at home??-On discharge would recommend only Levemir or mix insulin. A1c 14.6% indicating poor outpatient control.  Diabetic coordinator following ?Recent Labs  ?Lab 08/30/21 ?1222 08/30/21 ?1640 08/30/21 ?2126 08/31/21 ?0727 08/31/21 ?1149  ?GLUCAP 169* 141* 139* 88 108*  ? ?Sepsis  ?Right renal and perinephric abscess with pneumoperitoneum from ESBL E coli: ?CTA C/A/P -showing abscess pleural effusion septic emboli. recent E. coli bacteremia 2 months ago.Blood culture no growth so far but after IV antibiotics.MRSA PCR negative ?S/P IR drain  and fluid culture with ESBL E. Coli. Cont current Invanz through 4/26 as per ID.  Not a candidate for home infusion. TOC looking into placement. ?Tendency to leave against AMA threatened to leave AMA 4/17-patient has been counseled extensively he needs to complete IV antibiotics or else he will risk worsening of infection which could lead into sepsis, multiorgan failure leading to his disability and death . ?Last CT ABD 08/30/21: Interval insertion of a right posterior approach pigtail drainage catheter into the renal/perinephric abscess, with interval decreased abscess volume. 2. Moderate right pleural effusion with adjacent relaxation atelectasis is similar prior. 3. Round nodular consolidation in the perifissural posterior left upper lobe measures 14 mm, unchanged. 4. Large volume  of formed stool throughout the colon ? ?Moderate right pleural effusion-Per IR too small to be drained, ?Persistent moderate right pleural effusion, will order US  thoracentesis.  Effusion likely in the setting of sepsis and reactive effusion ?Possible septic emboli ZOX:WRUEAV from renal abscess?.  Blood culture no growth.  Continue antibiotics as above ? ?Acute metabolic encephalopathy likely from polypharmacy and infection.  Resolved.   ?Atypical chest pain serial troponin and EKG have been negative likely from renal abscess and pleural effusion its mostly on the right side-suspect in the setting of #2 right pleural effusion. ? ?Fall due to ambulatory dysfunction ongoing since January 2023, at risk for falls due to diabetic neuropathy and polypharmacy.  Multiple imaging including MRI brain, C, T and L spines, and CT head and cervical spine without significant finding to explain his symptoms.  Grossly nonfocal on exam.  Continue PT OT recommending skilled nursing facility.  Keep on fall precaution ? ?Grief:He states he recently lost his father.  Cont emotional support ? ?Normocytic anemia:Baseline Hgb 10-11.  some drop likely in the setting of hemodilution and some perinephric hemorrhage, likely from chronic illness as well.  Hh mostly in 8s.Monitor. ?Recent Labs  ?Lab 08/28/21 ?0450 08/29/21 ?0434 08/30/21 ?0434 09/01/21 ?4098 09/02/21 ?0432  ?HGB 7.8* 8.1* 8.0* 9.5* 8.1*  ?HCT 23.1* 23.9* 24.7* 29.4* 24.4*  ? ?  ?History of anxiety/BPD: ?Anxious and overwhelmed about prolonged hospitalization.  Repeatedly asking to go outside for fresh air but he is at risk of fall due to ambulatory dysfunction.  Threatening to leave AMA at times.  We will continue the current home Prozac trazodone Seroquel, he declined psychiatry consultation for further help.Continue  on new- Klonopin 0.5 twice daily and Atarax. ? ?Elevated serum creatinine:Creatinine 1.4-1.7. monitor ?Recent Labs  ?Lab 08/28/21 ?0450 08/29/21 ?0434 08/30/21 ?0434 09/01/21 ?1191 09/02/21 ?0432  ?BUN 17 18 19  22* 24*  ?CREATININE 1.69* 1.60* 1.46* 1.69* 1.61*  ? ?  ?Chronic pain syndrome: he is on MS Contin, Percocet and  Lyrica per narcotic database. Last fill on 07/12/2021.  Reportedly could not fill last month due to illness and hospitalization.  Continue home Lyrica at reduced dose along with MS Contin, OxyContin-and pain control, Tylenol tramadol.  Minimize narcotics. ? ?Tobacco use disorder: has been enncouraged cessation previously.Declined nicotine patch. ? ?Elevated d-dimer: LE Doppler negative for DVT.  CTA chest negative for PE but right perinephric abscess.   ? ?Dehydration: Likely from hyperglycemia.  Resolved..  Increase oral ? ?Nutritional status increased nutrient needs ?Nutrition Problem: Increased nutrient needs ?Etiology: acute illness ?Signs/Symptoms: estimated needs ?Interventions: Premier Protein, MVI ? ?DVT prophylaxis: Not much ambulatory at risk of DVT resume Lovenox Place and maintain sequential compression device Start: 08/26/21 1753 ?Code Status:   Code Status: Full Code ?Family Communication: plan of care discussed with patient at bedside. ?Patient status is: inpatient Level of care: Med-Surg  ?Remains inpatient because: Ongoing IV antibiotics through 4/26 ?Patient currently not stable ? ?Dispo: The patient is from: home ?           Anticipated disposition: SNF once bed available ? ?Mobility Assessment (last 72 hours)   ? ? Mobility Assessment   ? ? Row Name 09/01/21 1901 08/31/21 2020 08/31/21 1200 08/31/21 0903 08/30/21 2021  ? Does patient have an order for bedrest or is patient medically unstable No - Continue assessment No - Continue assessment -- No - Continue assessment No - Continue assessment  ? What is the highest level of mobility based on  the progressive mobility assessment? Level 5 (Walks with assist in room/hall) - Balance while stepping forward/back and can walk in room with assist - Complete Level 5 (Walks with assist in room/hall) - Balance while stepping forward/back and can walk in room with assist - Complete Level 5 (Walks with assist in room/hall) - Balance while stepping forward/back  and can walk in room with assist - Complete Level 4 (Walks with assist in room) - Balance while marching in place and cannot step forward and back - Complete Level 3 (Stands with assist) - Balance while standing  and

## 2021-09-02 NOTE — Procedures (Signed)
Ultrasound-guided diagnostic and therapeutic right thoracentesis performed yielding 520 cc of turbid, bloody fluid. No immediate complications. Follow-up chest x-ray pending. The fluid was sent to the lab for preordered studies. EBL none.       ? ?

## 2021-09-02 NOTE — TOC Transition Note (Signed)
Transition of Care (TOC) - CM/SW Discharge Note ? ?Patient Details  ?Name: Victor Matthews ?MRN: [id number] ?Date of Birth: 03/28/1986 ? ?Transition of Care (TOC) CM/SW Contact:  ?Ewing Schlein, LCSW ?Phone Number: ?09/02/2021, 2:27 PM ? ?Clinical Narrative: [place] made a bed offer and patient accepted it. CSW completed insurance authorization. Reference ID # is: [id number]. Patient has been approved for 09/02/21-09/06/21. Discharge summary, discharge orders, and SNF transfer report faxed to facility in hub. Facility is requesting a negative rapid COVID test. Patient will go to room 114 and the number for report is [phone]. ? ?Medical necessity form done; PTAR scheduled. Discharge packet completed. Patient notified of discharge. RN updated. TOC signing off. ? ?Final next level of care: Skilled Nursing Facility ?Barriers to Discharge: Barriers Resolved ? ?Patient Goals and CMS Choice ?Patient states their goals for this hospitalization and ongoing recovery are:: Go to short term rehab and then return home ?CMS Medicare.gov Compare Post Acute Care list provided to:: Patient ?Choice offered to / list presented to : Patient ? ?Discharge Placement ?Existing PASRR number confirmed : 08/30/21          ?Patient chooses bed at: Other - please specify in the comment section below: [facility]) ?Patient to be transferred to facility by: PTAR ?Patient and family notified of of transfer: 09/02/21 ? ?Discharge Plan and Services        ?DME Arranged: N/A ?DME Agency: NA ? ?Readmission Risk Interventions ? ?  08/25/2021  ?  3:07 PM  ?Readmission Risk Prevention Plan  ?Transportation Screening Complete  ?PCP or Specialist Appt within 5-7 Days Complete  ?PCP or Specialist Appt within 3-5 Days Not Complete  ?Not Complete comments Patient will discharge to SNF.  ?HRI or Home Care Consult Complete  ?Social Work Consult for Recovery Care Planning/Counseling Complete  ?Palliative Care Screening Not Applicable   ?Medication Review Oceanographer) Complete  ? ?

## 2021-09-02 NOTE — Progress Notes (Signed)
? ? ?Referring Physician(s): ?Dahlstedt,S ? ?Supervising Physician: Richarda Overlie ? ?Patient Status:  [facility] - In-pt ? ?Chief Complaint: ? ?Right perinephric abscess ? ?Subjective: ?Pt doing ok, had rt thoracentesis earlier; still has some rt flank discomfort at drain site ? ? ?Allergies: ?Patient has no known allergies. ? ?Medications: ?Prior to Admission medications   ?Medication Sig Start Date End Date Taking? Authorizing Provider  ?FLUoxetine (PROZAC) 20 MG capsule Take 20 mg by mouth daily as needed (anxiety attacks).   Yes [provider]  ?insulin aspart protamine- aspart (NOVOLOG MIX 70/30) (70-30) 100 UNIT/ML injection Inject 0.4 mLs (40 Units total) into the skin with breakfast, with lunch, and with evening meal. ?Patient taking differently: Inject 30 Units into the skin with breakfast, with lunch, and with evening meal. 08/09/21  Yes Mesner, Barbara Cower, MD  ?insulin detemir (LEVEMIR) 100 UNIT/ML injection Inject 0.3 mLs (30 Units total) into the skin daily. ?Patient taking differently: Inject 40 Units into the skin at bedtime. 08/09/21  Yes Mesner, Barbara Cower, MD  ?pregabalin (LYRICA) 300 MG capsule Take 1 capsule (300 mg total) by mouth in the morning, at noon, and at bedtime. 08/09/21 09/08/21 Yes Mesner, Barbara Cower, MD  ?QUEtiapine (SEROQUEL) 100 MG tablet Take 100 mg by mouth at bedtime as needed (anxiety).   Yes [provider]  ?traZODone (DESYREL) 150 MG tablet Take 1 tablet (150 mg total) by mouth at bedtime. 08/09/21  Yes Mesner, Barbara Cower, MD  ?clonazePAM (KLONOPIN) 0.5 MG tablet Take 0.5 tablets (0.25 mg total) by mouth 2 (two) times daily for 4 doses. 09/02/21 09/04/21  Lanae Boast, MD  ?diclofenac Sodium (VOLTAREN) 1 % GEL Apply 4 g topically 4 (four) times daily as needed (Chest wall pain). 09/02/21   Lanae Boast, MD  ?ertapenem 1,000 mg in sodium chloride 0.9 % 100 mL Inject 1,000 mg into the vein daily for 5 days. 09/03/21 09/08/21  Lanae Boast, MD  ?feeding supplement, GLUCERNA SHAKE, (GLUCERNA SHAKE) LIQD  Take 237 mLs by mouth 2 (two) times daily between meals. 09/02/21   Lanae Boast, MD  ?hydrOXYzine (ATARAX) 25 MG tablet Take 1 tablet (25 mg total) by mouth every 6 (six) hours as needed for anxiety or itching. 09/02/21   Lanae Boast, MD  ?insulin aspart (NOVOLOG) 100 UNIT/ML injection Inject 4 Units into the skin 3 (three) times daily with meals. 09/02/21   Lanae Boast, MD  ?insulin detemir (LEVEMIR) 100 UNIT/ML injection Inject 0.25 mLs (25 Units total) into the skin 2 (two) times daily. 09/02/21   Lanae Boast, MD  ?oxyCODONE-acetaminophen (PERCOCET/ROXICET) 5-325 MG tablet Take 1 tablet by mouth every 6 (six) hours as needed for up to 4 doses for severe pain. 09/02/21   Lanae Boast, MD  ?pregabalin (LYRICA) 150 MG capsule Take 1 capsule (150 mg total) by mouth 3 (three) times daily for 4 doses. 09/02/21 09/04/21  Lanae Boast, MD  ?senna-docusate (SENOKOT-S) 8.6-50 MG tablet Take 1 tablet by mouth 2 (two) times daily. 09/02/21   Lanae Boast, MD  ? ? ? ?Vital Signs: ?BP 118/76 (BP Location: Right Arm)   Pulse 88   Temp 98.7 ?F (37.1 ?C) (Oral)   Resp 18   Ht 6' (1.829 m)   Wt 216 lb 14.9 oz (98.4 kg)   SpO2 100%   BMI 29.42 kg/m?  ? ?Physical Exam awake/alert; rt flank drain intact, dressing clean and dry, mildly tender, output 35 cc turbid, bloody fluid ? ?Imaging: ?CT ABDOMEN PELVIS WO CONTRAST ? ?Result Date: 08/30/2021 ?CLINICAL DATA:  Retroperitoneal abscess. EXAM: CT ABDOMEN AND PELVIS WITHOUT CONTRAST TECHNIQUE: Multidetector CT imaging of the abdomen and pelvis was performed following the standard protocol without IV contrast. RADIATION DOSE REDUCTION: This exam was performed according to the departmental dose-optimization program which includes automated exposure control, adjustment of the mA and/or kV according to patient size and/or use of iterative reconstruction technique. COMPARISON:  August 25, 2021 FINDINGS: Lower chest: Moderate right pleural effusion with adjacent relaxation atelectasis is similar prior.  Round nodular consolidation in the perifissural posterior left upper lobe measures 14 mm, unchanged. Hepatobiliary: Unremarkable noncontrast appearance of the hepatic parenchyma. Gallbladder is decompressed. No biliary ductal dilation. Pancreas: No pancreatic ductal dilation or evidence of acute inflammation. Spleen: No splenomegaly. Adrenals/Urinary Tract: Left adrenal gland appears normal. Right adrenal gland is not well identified. Interval insertion of a right posterior approach pigtail drainage catheter in the renal/perinephric gas and fluid collection with decrease in size of the collection now measuring 8.2 x 6.9 x 1.9 cm previously 8.3 x 8.0 x 3.9 cm. No hydronephrosis. No renal, ureteral or bladder calculi identified. Urinary bladder is unremarkable for degree of distension. Stomach/Bowel: No enteric contrast was administered. Stomach is unremarkable for degree of distension. No pathologic dilation of small or large bowel. Large volume of formed stool throughout the colon. Vascular/Lymphatic: Normal caliber abdominal aorta. No pathologically enlarged abdominal or pelvic lymph nodes. Scattered prominent abdominopelvic lymph nodes appears similar prior. Reproductive: Prostate is unremarkable. Other: Trace free fluid in the right pericolic gutter and pelvis likely reflect sequela of the renal abscess. Musculoskeletal: No acute osseous abnormality. IMPRESSION: 1. Interval insertion of a right posterior approach pigtail drainage catheter into the renal/perinephric abscess, with interval decreased abscess volume. 2. Moderate right pleural effusion with adjacent relaxation atelectasis is similar prior. 3. Round nodular consolidation in the perifissural posterior left upper lobe measures 14 mm, unchanged. 4. Large volume of formed stool throughout the colon. Electronically Signed   By: Maudry Mayhew M.D.   On: 08/30/2021 12:30  ? ?DG Chest 1 View ? ?Result Date: 09/02/2021 ?CLINICAL DATA:  Right pleural effusion.  Status post right thoracentesis. EXAM: CHEST  1 VIEW COMPARISON:  09/01/2021 FINDINGS: Small to moderate right pleural effusion shows decreased in size since previous study. Decreased atelectasis noted in the right lung base. No pneumothorax visualized. Right lung is clear. Heart size is within normal limits. Right upper quadrant pigtail catheter is again noted. IMPRESSION: Decreased right pleural effusion and right basilar atelectasis. No pneumothorax visualized. Electronically Signed   By: Danae Orleans M.D.   On: 09/02/2021 12:21  ? ?DG Chest Port 1 View ? ?Result Date: 09/01/2021 ?CLINICAL DATA:  Shortness of breath. EXAM: PORTABLE CHEST 1 VIEW COMPARISON:  Chest x-ray 08/24/2021 and chest CT 08/25/2021. FINDINGS: The cardiac silhouette, mediastinal and hilar contours are within normal limits and stable. There is a persistent moderate-sized right pleural effusion with overlying atelectasis. The left lung is clear. The bony thorax is intact. IMPRESSION: Persistent moderate-sized right pleural effusion with overlying atelectasis. Electronically Signed   By: Rudie Meyer M.D.   On: 09/01/2021 13:33  ? ?US THORACENTESIS ASP PLEURAL SPACE W/IMG GUIDE ? ?Result Date: 09/02/2021 ?INDICATION: Patient with history of right perinephric abscess with prior drainage, right pleural effusion, dyspnea; request received for diagnostic and therapeutic right thoracentesis. EXAM: ULTRASOUND GUIDED DIAGNOSTIC AND THERAPEUTIC RIGHT THORACENTESIS MEDICATIONS: 10 mL 1% lidocaine COMPLICATIONS: None immediate. PROCEDURE: An ultrasound guided thoracentesis was thoroughly discussed with the patient and questions answered. The benefits, risks, alternatives and complications  were also discussed. The patient understands and wishes to proceed with the procedure. Written consent was obtained. Ultrasound was performed to localize and mark an adequate pocket of fluid in the right chest. The area was then prepped and draped in the normal sterile  fashion. 1% Lidocaine was used for local anesthesia. Under ultrasound guidance a 6 Fr Safe-T-Centesis catheter was introduced. Thoracentesis was performed. The catheter was removed and a dressing applied. FINDING

## 2021-09-02 NOTE — Plan of Care (Signed)

## 2021-09-02 NOTE — Discharge Summary (Signed)
Physician Discharge Summary  ?Charlotte SanesLarry Hogate OZH:[id number] DOB: Dec 28, 1985 DOA: 08/24/2021 ? ?PCP: System, Provider Not In ? ?Admit date: 08/24/2021 ?Discharge date: 09/02/2021 ?Recommendations for Outpatient Follow-up:  ?Follow up with PCP and ID doc in 1 weeks-call for appointment ?Please obtain CMP/CBC in one week ? ?Discharge Dispo: SNF ?Discharge Condition: Stable ?Code Status:   Code Status: Full Code ?Diet recommendation:  ?Diet Order   ? ?       ?  Diet Carb Modified Fluid consistency: Thin; Room service appropriate? Yes  Diet effective now       ?  ? ?  ?  ? ?  ?  ? ?Brief/Interim Summary: ?36 year old M with PMH of DM-1 with peripheral neuropathy, chronic pain on chronic opiate, tobacco use disorder, anxiety, BPD, E. coli bacteremia and COVID-19 infection in 06/2021, and recent hospitalization from 4/4-4/7 for hyperglycemia and ambulatory dysfunction when he left AMA returned [facility] ED with hyperglycemia and falls x2, and admitted for hyperglycemia to 715, AKI, acute metabolic encephalopathy, possible pneumonia and uncontrolled hypertension.  He was started on insulin drip, IV ceftriaxone and IV fluid.  He also had elevated D-dimer in ED- s/p CTA chest -showed right renal and perinephric abscess, moderate right pleural effusion and possible septic emboli to RUL but negative for PE.  Antibiotics broadened to IV Zosyn.  Urology consulted.  CT abdomen and pelvis obtained confirmed renal abscess.  IR consulted, and drain placed with bloody output.  Abscess culture with ESBL E. Coli. ID recommended Invanz through 4/26 in house.  Patient had persistent moderate pleural effusion on the right with pleuritic chest pain underwent thoracentesis with 520 cc removal and feeling much improved after that.  PT OT seen and recommending skilled nursing facility, TOC following.  He has a place available today 4/20 and is being discharged to skilled nursing facility in medically stable condition  ? ?Discharge Diagnoses:   ?Principal Problem: ?  Uncontrolled type 1 diabetes mellitus with hyperglycemia, with long-term current use of insulin (HCC) ?Active Problems: ?  Sepsis (HCC) ?  Fall due to ambulatory dysfunction ?  Atypical chest pain ?  Acute metabolic encephalopathy ?  Pleural effusion on right ?  Possible RUL septic emboli ?  Chronic pain syndrome ?  Elevated serum creatinine ?  History of anxiety/bipolar disorder ?  Normocytic anemia ?  Grief ?  Dehydration ?  Fever ?  Elevated d-dimer ?  Hypokalemia and hyponatremia ?  Kidney, perinephric abscess ?  Renal abscess, right ?  Tobacco use disorder ? ?Uncontrolled T1DM with hyperglycemia, with long-term current use of insulin: ?Likely from poor compliance.no evidence of HHS or DKA.  Overall stable on current insulin regimen followed by DM coordinator.  Continue Levemir 25 units twice daily, NovoLog 4 units 3 times daily and SSI. At home on Levemir 30 units daily and NovoLog 70/30 at 40 units 3 times daily at home??-On discharge would recommend current insulin regimen . A1c 14.6% indicating poor outpatient control.  Diabetic coordinator following ?       ?Recent Labs  ?Lab 08/30/21 ?1222 08/30/21 ?1640 08/30/21 ?2126 08/31/21 ?0727 08/31/21 ?1149  ?GLUCAP 169* 141* 139* 88 108*  ?  ?Sepsis  ?Right renal and perinephric abscess with pneumoperitoneum from ESBL E coli: ?CTA C/A/P -showing abscess pleural effusion septic emboli. recent E. coli bacteremia 2 months ago.Blood culture no growth so far but after IV antibiotics.MRSA PCR negative ?S/P IR drain  and fluid culture with ESBL E. Coli. Cont current Invanz through 4/26 as  per ID.  Not a candidate for home infusion. TOC looking into placement. ?Tendency to leave against AMA threatened to leave AMA 4/17-patient has been counseled extensively he needs to complete IV antibiotics or else he will risk worsening of infection which could lead into sepsis, multiorgan failure leading to his disability and death . ?Last CT ABD Sep 27, 2021:  Interval insertion of a right posterior approach pigtail drainage catheter into the renal/perinephric abscess, with interval decreased abscess volume. 2. Moderate right pleural effusion with adjacent relaxation atelectasis is similar prior. 3. Round nodular consolidation in the perifissural posterior left upper lobe measures 14 mm, unchanged. 4. Large volume of formed stool throughout the colon ?  ?Moderate right pleural effusion-Per IR too small to be drained, ?Persistent moderate right pleural effusion, will order US thoracentesis.  Effusion likely in the setting of sepsis and reactive effusion-s/p thoracentesis with 520 cc removal 4/20-pleural fluid analysis result not available at the time of discharge. ? ?Possible septic emboli STM:HDQQIW from renal abscess?.  Blood culture no growth.  Continue antibiotics as above ?  ?Acute metabolic encephalopathy likely from polypharmacy and infection.  Resolved.   ?Atypical chest pain serial troponin and EKG have been negative likely from renal abscess and pleural effusion its mostly on the right side-suspect in the setting of #2 right pleural effusion. ?  ?Fall due to ambulatory dysfunction ongoing since January 2023, at risk for falls due to diabetic neuropathy and polypharmacy.  Multiple imaging including MRI brain, C, T and L spines, and CT head and cervical spine without significant finding to explain his symptoms.  Grossly nonfocal on exam.  Continue PT OT recommending skilled nursing facility.  Keep on fall precaution ?  ?Grief:He states he recently lost his father.  Cont emotional support ?  ?Normocytic anemia:Baseline Hgb 10-11.  some drop likely in the setting of hemodilution and some perinephric hemorrhage, likely from chronic illness as well.  Hh mostly in 8s.Monitor. ?Last Labs   ?       ?Recent Labs  ?Lab 08/28/21 ?0450 08/29/21 ?0434 09-27-21 ?0434 09/01/21 ?9798 09/02/21 ?0432  ?HGB 7.8* 8.1* 8.0* 9.5* 8.1*  ?HCT 23.1* 23.9* 24.7* 29.4* 24.4*  ?   ?   ?History of anxiety/BPD: ?Anxious and overwhelmed about prolonged hospitalization.  Repeatedly asking to go outside for fresh air but he is at risk of fall due to ambulatory dysfunction.  Threatening to leave AMA at times.  We will continue the current home Prozac trazodone Seroquel, Klonopin ?and Atarax. ?  ?Elevated serum creatinine:Creatinine 1.4-1.7. monitor ?Last Labs   ?       ?Recent Labs  ?Lab 08/28/21 ?0450 08/29/21 ?0434 Sep 27, 2021 ?0434 09/01/21 ?9211 09/02/21 ?0432  ?BUN 17 18 19  22* 24*  ?CREATININE 1.69* 1.60* 1.46* 1.69* 1.61*  ?   ?  ?Chronic pain syndrome: he is on MS Contin, Percocet and Lyrica per narcotic database. Last fill on 07/12/2021.  Reportedly could not fill last month due to illness and hospitalization.  Continue home Lyrica at reduced dose, send out on percocet prn,minimize narcotics. ?  ?Tobacco use disorder: has been enncouraged cessation previously.Declined nicotine patch. ?  ?Elevated d-dimer: LE Doppler negative for DVT.  CTA chest negative for PE but right perinephric abscess.   ?  ?Dehydration: Likely from hyperglycemia.  Resolved..  Increase oral ?  ?Nutritional status increased nutrient needs ?Nutrition Problem: Increased nutrient needs ?Etiology: acute illness ?Signs/Symptoms: estimated needs ?Interventions: Premier Protein, MVI ? ?Consults: ?ID, IR ?Subjective: ?Alert awake oriented.  Resting comfortably feels  much improved after thoracentesis.  Agreeable for discharge today ? ?Discharge Exam: ?Vitals:  ? 09/02/21 1203 09/02/21 1250  ?BP: (!) 100/59 118/76  ?Pulse:  88  ?Resp:  18  ?Temp:  98.7 ?F (37.1 ?C)  ?SpO2:  100%  ? ?General: Pt is alert, awake, not in acute distress ?Cardiovascular: RRR, S1/S2 +, no rubs, no gallops ?Respiratory: CTA bilaterally, no wheezing, no rhonchi ?Abdominal: Soft, NT, ND, bowel sounds + ?Extremities: no edema, no cyanosis ? ?Discharge Instructions ? ?Discharge Instructions   ? ? Discharge instructions   Complete by: As directed ?  ? Weekly CBC  CMP while on IV antibiotics, follow-up with ID doctor, primary care doctor, ? ?Please call call MD or return to ER for similar or worsening recurring problem that brought you to hospital or if any fever,nausea/vomiting,abdo

## 2021-09-02 NOTE — Progress Notes (Signed)
Occupational Therapy Treatment ?Patient Details ?Name: Victor Matthews ?MRN: [id number] ?DOB: 11-02-1985 ?Today's Date: 09/02/2021 ? ? ?History of present illness 36 year old Male  admitted for falls x2, hyperglycemia with BS up to 715, AKI, acute metabolic encephalopathy, possible pneumonia and uncontrolled hypertension; also had elevated D-dimer,  CTA chest ordered to rule out PE and showed right renal and perinephric abscess, moderate right pleural effusion and possible septic emboli to RUL but negative for PE.  Marland Kitchen  Urology consulted.  CT abdomen and pelvis obtained and confirmed renal abscess.  IR consulted, and drain placed with bloody output on 4/13  PMH of DM-1 with peripheral neuropathy, chronic pain on chronic opiate, anxiety, BPD,  COVID-19 infection in 06/2021, and recent hospitalization from 4/4-4/7 for hyperglycemia and ambulatory dysfunction when he left AMA returned to [facility] ED as noted above ?  ?OT comments ? Patient progressing towards goals. Reports pain in right shoulder is resolved. Patient demonstrates ability to ambulate to bathroom and stand at sink with min guard and use of RW. Patent able to take his hands off walker and maintain balance for grooming task. No overt loss of balance today. Cont pOC.  ? ?Recommendations for follow up therapy are one component of a multi-disciplinary discharge planning process, led by the attending physician.  Recommendations may be updated based on patient status, additional functional criteria and insurance authorization. ?   ?Follow Up Recommendations ? Skilled nursing-short term rehab (<3 hours/day)  ?  ?Assistance Recommended at Discharge Frequent or constant Supervision/Assistance  ?Patient can return home with the following ? A little help with walking and/or transfers;A lot of help with bathing/dressing/bathroom;Assistance with cooking/housework;Help with stairs or ramp for entrance;Assist for transportation ?  ?Equipment Recommendations ? Tub/shower  bench;BSC/3in1  ?  ?Recommendations for Other Services   ? ?  ?Precautions / Restrictions Precautions ?Precautions: Fall ?Precaution Comments: frequent falls, hx of neuropathy, rt side drain ?Restrictions ?Weight Bearing Restrictions: No  ? ? ?  ? ?Mobility Bed Mobility ?  ?  ?  ?  ?  ?  ?  ?General bed mobility comments: up in chiar ?  ? ?Transfers ?Overall transfer level: Needs assistance ?Equipment used: Rolling walker (2 wheels) ?  ?Sit to Stand: Min guard ?  ?  ?  ?  ?  ?General transfer comment: Min guard for in room ambulation. ?  ?  ?Balance Overall balance assessment: Needs assistance ?Sitting-balance support: No upper extremity supported, Feet supported ?Sitting balance-Leahy Scale: Fair ?  ?  ?Standing balance support: No upper extremity supported ?Standing balance-Leahy Scale: Fair ?Standing balance comment: able to take hands off of walker to stand at sink ?  ?  ?  ?  ?  ?  ?  ?  ?  ?  ?  ?   ? ?ADL either performed or assessed with clinical judgement  ? ?ADL Overall ADL's : Needs assistance/impaired ?  ?  ?Grooming: Wash/dry hands;Standing ?Grooming Details (indicate cue type and reason): Patient ambulated to bathroom and stood at sink to wash hands in preparation for lunch. ?  ?  ?  ?  ?  ?  ?  ?  ?  ?  ?  ?  ?  ?  ?Functional mobility during ADLs: Min guard;Rolling walker (2 wheels) ?  ?  ? ?Extremity/Trunk Assessment Upper Extremity Assessment ?Upper Extremity Assessment: Overall WFL for tasks assessed ?  ?  ?  ?  ?  ? ?Vision Patient Visual Report: No change from baseline ?  ?  ?  Perception   ?  ?Praxis   ?  ? ?Cognition Arousal/Alertness: Awake/alert ?Behavior During Therapy: [facility] for tasks assessed/performed ?Overall Cognitive Status: Within Functional Limits for tasks assessed ?  ?  ?  ?  ?  ?  ?  ?  ?  ?  ?  ?  ?  ?  ?  ?  ?  ?  ?  ?   ?Exercises   ? ?  ?Shoulder Instructions   ? ? ?  ?General Comments    ? ? ?Pertinent Vitals/ Pain       Pain Assessment ?Pain Assessment: No/denies pain ? ?Home  Living   ?  ?  ?  ?  ?  ?  ?  ?  ?  ?  ?  ?  ?  ?  ?  ?  ?  ?  ? ?  ?Prior Functioning/Environment    ?  ?  ?  ?   ? ?Frequency ? Min 2X/week  ? ? ? ? ?  ?Progress Toward Goals ? ?OT Goals(current goals can now be found in the care plan section) ? Progress towards OT goals: Progressing toward goals ? ?Acute Rehab OT Goals ?Patient Stated Goal: get stronger ?OT Goal Formulation: With patient ?Time For Goal Achievement: 09/13/21 ?Potential to Achieve Goals: Good  ?Plan Discharge plan remains appropriate   ? ?Co-evaluation ? ? ?   ?  ?  ?  ?  ? ?  ?AM-PAC OT "6 Clicks" Daily Activity     ?Outcome Measure ? ? Help from another person eating meals?: None ?Help from another person taking care of personal grooming?: A Little ?Help from another person toileting, which includes using toliet, bedpan, or urinal?: A Little ?Help from another person bathing (including washing, rinsing, drying)?: A Little ?Help from another person to put on and taking off regular upper body clothing?: A Little ?Help from another person to put on and taking off regular lower body clothing?: A Lot ?6 Click Score: 18 ? ?  ?End of Session Equipment Utilized During Treatment: Rolling walker (2 wheels) ? ?OT Visit Diagnosis: Unsteadiness on feet (R26.81);Other abnormalities of gait and mobility (R26.89);Repeated falls (R29.6);Muscle weakness (generalized) (M62.81) ?  ?Activity Tolerance Patient tolerated treatment well ?  ?Patient Left in chair;with call bell/phone within reach ?  ?Nurse Communication Mobility status ?  ? ?   ? ?Time: 3149-7026 ?OT Time Calculation (min): 8 min ? ?Charges: OT General Charges ?$OT Visit: 1 Visit ?OT Treatments ?$Self Care/Home Management : 8-22 mins ? ?Inmer Nix, OTR/L ?Acute Care Rehab Services  ?Office [phone] ?Pager: [phone]  ? ?Lulani Bour L Mansa Willers ?09/02/2021, 4:11 PM ?

## 2021-09-02 NOTE — Progress Notes (Signed)
Report called to [place]  ?

## 2021-09-02 NOTE — Progress Notes (Signed)
?Subjective: ?CT reveals some resolution of large perinephric abscess.  Still some gas in perinephric area.  Patient stable ?Objective: ?Vital signs in last 24 hours: ?Temp:  [98.7 ?F (37.1 ?C)-99.5 ?F (37.5 ?C)] 98.7 ?F (37.1 ?C) (04/20 1250) ?Pulse Rate:  [88-99] 88 (04/20 1250) ?Resp:  [16-18] 18 (04/20 1250) ?BP: (100-133)/(59-79) 118/76 (04/20 1250) ?SpO2:  [94 %-100 %] 100 % (04/20 1250) ? ?Intake/Output from previous day: ?04/19 0701 - 04/20 0700 ?In: 1900 [P.O.:1800; IV Piggyback:100] ?Out: 2335 [Urine:2300; Drains:35] ?Intake/Output this shift: ?Total I/O ?In: -  ?Out: 600 [Urine:600] ? ? ? ?Lab Results: ?Recent Labs  ?  09/01/21 ?0506 09/02/21 ?0432  ?HGB 9.5* 8.1*  ?HCT 29.4* 24.4*  ? ?BMET ?Recent Labs  ?  09/01/21 ?0506 09/02/21 ?0432  ?NA 135 135  ?K 4.4 4.2  ?CL 97* 98  ?CO2 27 28  ?GLUCOSE 122* 123*  ?BUN 22* 24*  ?CREATININE 1.69* 1.61*  ?CALCIUM 9.5 9.3  ? ?No results for input(s): LABPT, INR in the last 72 hours. ?No results for input(s): LABURIN in the last 72 hours. ?Results for orders placed or performed during the hospital encounter of 08/24/21  ?MRSA Next Gen by PCR, Nasal     Status: None  ? Collection Time: 08/24/21  2:33 PM  ? Specimen: Nasal Mucosa; Nasal Swab  ?Result Value Ref Range Status  ? MRSA by PCR Next Gen NOT DETECTED NOT DETECTED Final  ?  Comment: (NOTE) ?The GeneXpert MRSA Assay (FDA approved for NASAL specimens only), ?is one component of a comprehensive MRSA colonization surveillance ?program. It is not intended to diagnose MRSA infection nor to guide ?or monitor treatment for MRSA infections. ?Test performance is not FDA approved in patients less than 2 years ?old. ?Performed at [facility], [address] ?[place], [place] [address] ?  ?Culture, blood (Routine X 2) w Reflex to ID Panel     Status: None  ? Collection Time: 08/25/21  3:35 PM  ? Specimen: BLOOD  ?Result Value Ref Range Status  ? Specimen Description   Final  ?  BLOOD BLOOD RIGHT  FOREARM ?Performed at [facility], [address] [place], [place] [address] ?  ? Special Requests   Final  ?  BOTTLES DRAWN AEROBIC ONLY Blood Culture adequate volume ?Performed at [facility], [address] [place], [place] [address] ?  ? Culture   Final  ?  NO GROWTH 5 DAYS ?Performed at [facility], [address] [place], [place] [address] ?  ? Report Status 08/30/2021 FINAL  Final  ?Culture, blood (Routine X 2) w Reflex to ID Panel     Status: None  ? Collection Time: 08/25/21  3:35 PM  ? Specimen: BLOOD  ?Result Value Ref Range Status  ? Specimen Description   Final  ?  BLOOD BLOOD RIGHT HAND ?Performed at [facility], [address] [place], [place] [address] ?  ? Special Requests   Final  ?  BOTTLES DRAWN AEROBIC ONLY Blood Culture results may not be optimal due to an inadequate volume of blood received in culture bottles ?Performed at [facility], [address] [place], [place] [address] ?  ? Culture   Final  ?  NO GROWTH 5 DAYS ?Performed at [facility], [address] [place], [place] [address] ?  ? Report Status 08/30/2021 FINAL  Final  ?Aerobic/Anaerobic Culture w Gram Stain (surgical/deep wound)  Status: None  ? Collection Time: 08/26/21  2:35 PM  ? Specimen: Abscess  ?Result Value Ref Range Status  ? Specimen Description   Final  ?  ABSCESS ?Performed at [facility], [address], [place], [place] [address] ?  ? Special Requests   Final  ?  ABDOMEN ?Performed at [facility], [address], [place], [place] [address] ?  ? Gram Stain   Final  ?  ABUNDANT WBC PRESENT,BOTH PMN AND MONONUCLEAR ?ABUNDANT GRAM NEGATIVE RODS ?  ? Culture   Final  ?  ABUNDANT ESCHERICHIA COLI ?Confirmed Extended Spectrum Beta-Lactamase Producer (ESBL).  In bloodstream infections from ESBL organisms, carbapenems are preferred over piperacillin/tazobactam. They are shown to have a  lower risk of mortality. ?NO ANAEROBES ISOLATED ?Performed at [facility], [address], [place], [place] [address] ?  ? Report Status 08/31/2021 FINAL  Final  ? Organism ID, Bacteria ESCHERICHIA COLI  Final  ?    Susceptibility  ? Escherichia coli - MIC*  ?  AMPICILLIN >=32 RESISTANT Resistant   ?  CEFAZOLIN >=64 RESISTANT Resistant   ?  CEFEPIME 0.5 SENSITIVE Sensitive   ?  CEFTAZIDIME RESISTANT Resistant   ?  CEFTRIAXONE 32 RESISTANT Resistant   ?  CIPROFLOXACIN 0.5 INTERMEDIATE Intermediate   ?  GENTAMICIN <=1 SENSITIVE Sensitive   ?  IMIPENEM <=0.25 SENSITIVE Sensitive   ?  TRIMETH/SULFA >=320 RESISTANT Resistant   ?  AMPICILLIN/SULBACTAM >=32 RESISTANT Resistant   ?  PIP/TAZO <=4 SENSITIVE Sensitive   ?  * ABUNDANT ESCHERICHIA COLI  ?Resp Panel by RT-PCR (Flu A&B, Covid) Nasopharyngeal Swab     Status: None  ? Collection Time: 09/02/21  2:17 PM  ? Specimen: Nasopharyngeal Swab; Nasopharyngeal(NP) swabs in vial transport medium  ?Result Value Ref Range Status  ? SARS Coronavirus 2 by RT PCR NEGATIVE NEGATIVE Final  ?  Comment: (NOTE) ?SARS-CoV-2 target nucleic acids are NOT DETECTED. ? ?The SARS-CoV-2 RNA is generally detectable in upper respiratory ?specimens during the acute phase of infection. The lowest ?concentration of SARS-CoV-2 viral copies this assay can detect is ?138 copies/mL. A negative result does not preclude SARS-Cov-2 ?infection and should not be used as the sole basis for treatment or ?other patient management decisions. A negative result may occur with  ?improper specimen collection/handling, submission of specimen other ?than nasopharyngeal swab, presence of viral mutation(s) within the ?areas targeted by this assay, and inadequate number of viral ?copies(<138 copies/mL). A negative result must be combined with ?clinical observations, patient history, and epidemiological ?information. The expected result is Negative. ? ?Fact Sheet for Patients:   ?BloggerCourse.com ? ?Fact Sheet for Healthcare Providers:  ?SeriousBroker.it ? ?This test is no t yet approved or cleared by the [place] FDA and  ?has been authorized for detection and/or diagnosis of SARS-CoV-2 by ?FDA under an Emergency Use Authorization (EUA). This EUA will remain  ?in effect (meaning this test can be used) for the duration of the ?COVID-19 declaration under Section 564(b)(1) of the Act, 21 ?U.S.C.section 360bbb-3(b)(1), unless the authorization is terminated  ?or revoked sooner.  ? ? ?  ? Influenza A by PCR NEGATIVE NEGATIVE Final  ? Influenza B by PCR NEGATIVE NEGATIVE Final  ?  Comment: (NOTE) ?The Xpert Xpress SARS-CoV-2/FLU/RSV plus assay is intended as an aid ?in the diagnosis of influenza from Nasopharyngeal swab specimens and ?should not be used as a sole basis for treatment. Nasal washings and ?aspirates are unacceptable for Xpert Xpress  SARS-CoV-2/FLU/RSV ?testing. ? ?Fact Sheet for Patients: ?BloggerCourse.com ? ?Fact Sheet for Healthcare Providers: ?SeriousBroker.it ? ?This test is not yet approved or cleared by the [place] FDA and ?has been authorized for detection and/or diagnosis of SARS-CoV-2 by ?FDA under an Emergency Use Authorization (EUA). This EUA will remain ?in effect (meaning this test can be used) for the duration of the ?COVID-19 declaration under Section 564(b)(1) of the Act, 21 U.S.C. ?section 360bbb-3(b)(1), unless the authorization is terminated or ?revoked. ? ?Performed at [facility], [address] ?[place], [place] [address] ?  ? ? ?Studies/Results: ?DG Chest 1 View ? ?Result Date: 09/02/2021 ?CLINICAL DATA:  Right pleural effusion. Status post right thoracentesis. EXAM: CHEST  1 VIEW COMPARISON:  09/01/2021 FINDINGS: Small to moderate right pleural effusion shows decreased in size since previous study. Decreased atelectasis noted in  the right lung base. No pneumothorax visualized. Right lung is clear. Heart size is within normal limits. Right upper quadrant pigtail catheter is again noted. IMPRESSION: Decreased right pleural effusion and right basilar atelectasis. No pneum

## 2021-09-03 ENCOUNTER — Other Ambulatory Visit: Payer: Self-pay | Admitting: Internal Medicine

## 2021-09-03 DIAGNOSIS — N151 Renal and perinephric abscess: Secondary | ICD-10-CM

## 2021-09-03 LAB — CYTOLOGY - NON PAP

## 2021-09-04 NOTE — Discharge Summary (Signed)
Physician Discharge Summary  ?Victor Matthews FWY:[id number] DOB: 09-Feb-1986 DOA: 08/17/2021 ? ?PCP: System, Provider Not In ? ?Admit date: 08/17/2021 ?Discharge date: 09/04/2021 ? ?Time spent: 10 minutes ? ?Recommendations for Outpatient Follow-up:  ?Patient left AMA  ?See below and previous notes ? ? ?Discharge Diagnoses:  ?Active Problems: ?  Fall due to ambulatory dysfunction ?  Chronic pain syndrome ?  Right facial numbness ?  Neuropathy ?  Unresponsive episode ?  History of bacteremia ?  History of COVID-19 ?  Abnormal MRI, kidney ? ? ?Discharge Condition: left AMA ? ?Diet recommendation: left AMA ? ?Filed Weights  ? 08/17/21 1355 08/17/21 1825  ?Weight: 99.3 kg 98.4 kg  ? ? 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   ?  ?Patient left AMA last night.  Unclear exact reasons, but based on my discussion with nursing this AM there were issues with compliance with diabetic diet/bringing in outside food and he left as Victor Matthews result of issues with this.  Not clear if he left with help/assistance given ambulatory dysfunction we were working up, sounds like per discussion with RN he walked off floor.  See previous notes for additional details.     ? ?Hospital Course (last note Victor Matthews&P below):  ?* Hyperglycemia ?Transitioned to subcutaneous insulin today ?He's on 40 units levemir at home and 30 units humalog TID ac (it says 70/30 in his med rec, which doesn't seem right) ?Adjust basal/bolus prn ?He was eating sweet tea, [employer] earlier today -> education ?  ?Right facial numbness ?To light touch, MRI brain with ambulatory dysfunction -> negative ?  ?Ambulatory dysfunction ?Notes progressively worsening ambulatory dysfunction since January ?Worse in the past month or so, says he can't walk to bathroom/care for himself ?Hx  "bulging disc" on R.  He notes saddle anesthesia for 2 months and urinary incontinence maybe 6 months.  ?4/5 strength to LE's bilaterally L>R ?Plain films with mild anterior wedging of T12, likely developmental ?Lumbar MRI with small L subarticular disc protrusion at L5-S1 ?MRI T and L spine pending  ?Neuropathy probably significant contributor as well.  May need to consult neurology if unrevealing. ?  ?Chronic pain syndrome ?Pt seeing pain management for chronic BLE pain. ?Did verify with PMP aware that pt does take: ?Percocet 7.5 mg TID PRN ?MS Contin 15 mg BID scheduled ?Lyrica 300 mg PO TID ?He understands need to get refills for chronic pain meds from primary prescriber ?  ?Unresponsive episode ?Notes they had issues waking him up on day of admission, unclear, I don't see documentation regarding this -> ? Related to pain meds/AKI.  Will monitor for now. ?  ?Neuropathy ?Peripheral neuropathy from DM, likely contributing to ambulatory dysfunction ?  ?History of bacteremia ?E coli in 2/10 blood cx ?Treated and blood cx since negative ?  ?History of COVID-19 ?2/27, continued ageusia  ?Feels like he's declined since COVID infection ?  ?Addendum -> BPA for suicide screen noted, patient denies SI ? ?Discharge Exam: ?Vitals:  ? 08/19/21 1401 08/19/21 2114  ?BP: 121/80 133/84  ?Pulse: 90 91  ?Resp: 16 16  ?Temp: 98.7 ?F (37.1 ?C) 98.3 ?F (36.8 ?C)  ?SpO2: 100% 100%  ? ? ?Left AMA, no exam ? ?Discharge Instructions ? ? ? ?Allergies as of 08/20/2021   ?No Known Allergies ?  ? ?  ?Medication  List  ?Patient left AMA, unable to reconcile meds at discharge ? ?No Known Allergies ? ? ?The results of significant diagnostics from this hospitalization (including imaging, microbiology, ancillary and laboratory) are listed below for reference.   ? ?Significant Diagnostic Studies: ?See previous notes and chart for labs and imaging studies ? ?Signed: ? ?Victor Nicks MD.  ?[facility] ?09/04/2021, 8:13 AM ? ?  ?

## 2021-09-05 LAB — BODY FLUID CULTURE W GRAM STAIN
Culture: NO GROWTH
Gram Stain: NONE SEEN

## 2021-09-16 ENCOUNTER — Ambulatory Visit
Admission: RE | Admit: 2021-09-16 | Discharge: 2021-09-16 | Disposition: A | Discharge status: Home / Self Care | Payer: Medicare Other | Source: Ambulatory Visit | Attending: Physician Assistant | Admitting: Physician Assistant

## 2021-09-16 ENCOUNTER — Ambulatory Visit
Admission: RE | Admit: 2021-09-16 | Discharge: 2021-09-16 | Disposition: A | Discharge status: Home / Self Care | Payer: Medicare Other | Source: Ambulatory Visit | Attending: Internal Medicine | Admitting: Internal Medicine

## 2021-09-16 DIAGNOSIS — N151 Renal and perinephric abscess: Secondary | ICD-10-CM

## 2021-09-16 HISTORY — PX: IR RADIOLOGIST EVAL & MGMT: IMG5224

## 2021-09-16 IMAGING — CT CT ABD-PELV W/O CM
2 of 4 series · 12 of 46 positions shown, 14 images · non-contrast
Comparison: [DATE]

CLINICAL DATA: Renal abscess, status post percutaneous drainage



[Series 2: routine abdomen pelvis without 5.00 br40 s3 axial · axial · non-contrast · 0.62mm/px · z∈[+1131,+1526]mm · 9 of 95 slices shown, 11 images]
[im 8/95  soft-tissue]
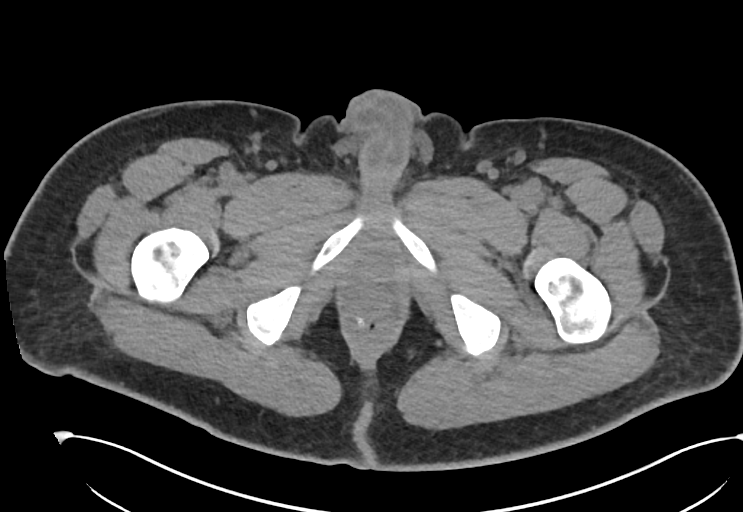
[im 8/95  bone]
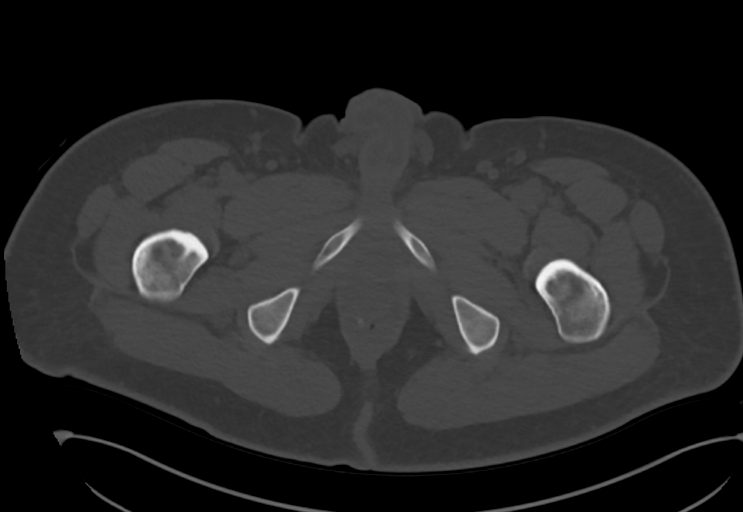
[im 19/95  soft-tissue]
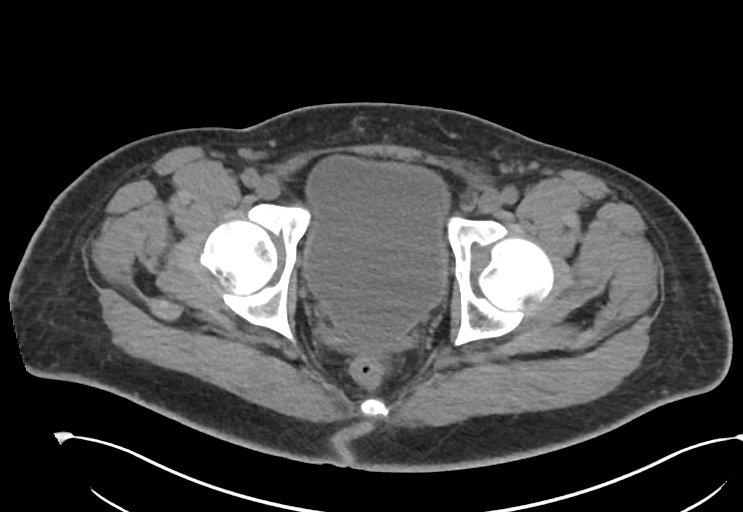
[im 27/95  soft-tissue]
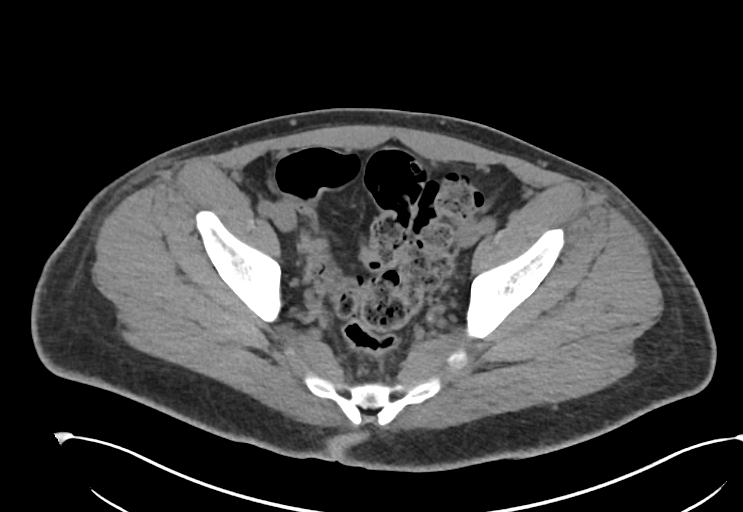
[im 38/95  soft-tissue]
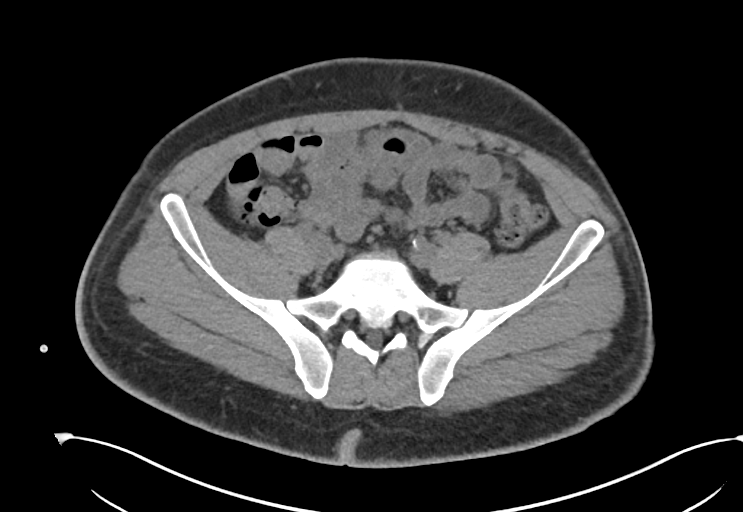
[im 49/95  soft-tissue]
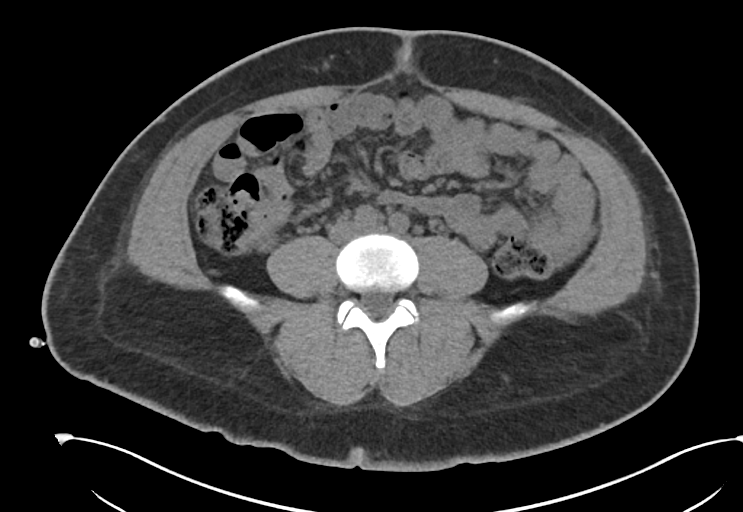
[im 57/95  soft-tissue]
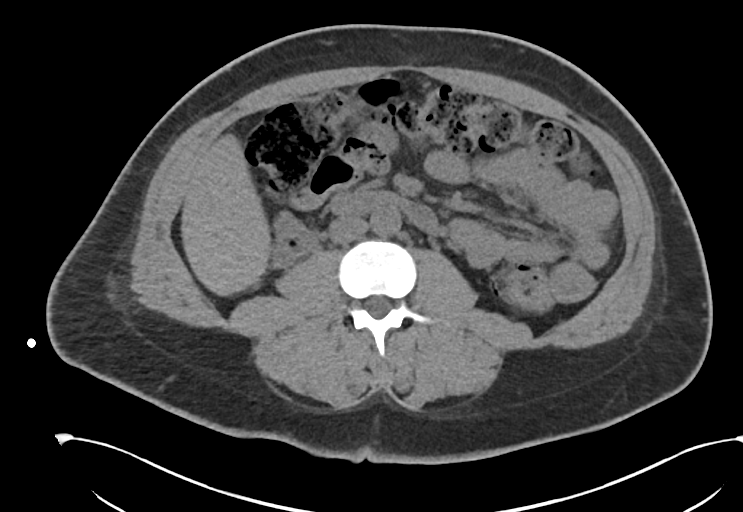
[im 68/95  soft-tissue]
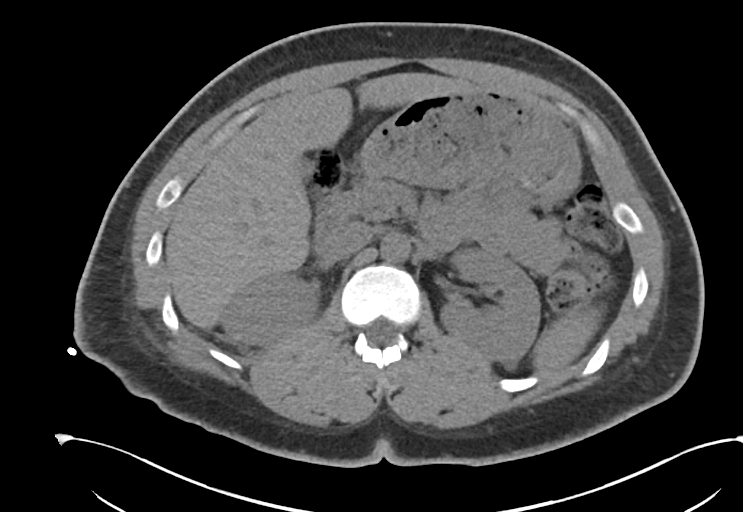
[im 79/95  soft-tissue]
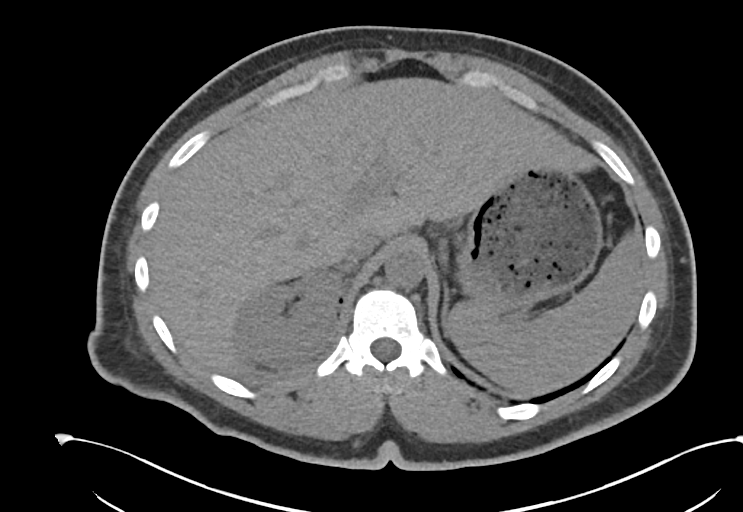
[im 87/95  soft-tissue]
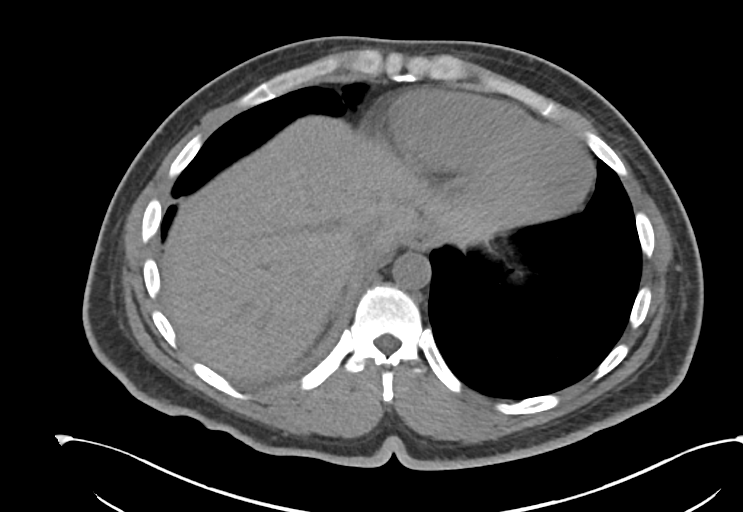
[im 87/95  bone]
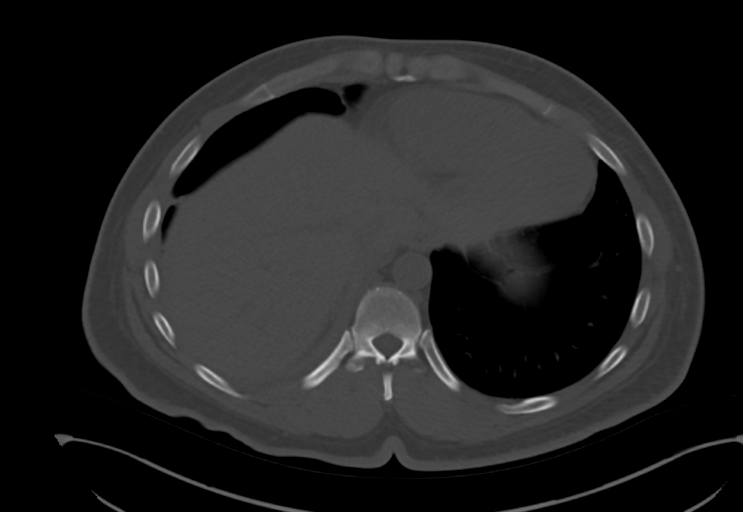

[Series 4: routine abdomen pelvis without 2.00 br40 s3 cor · coronal · non-contrast · 0.90mm/px · 3 of 159 slices shown]
[im 53/159  soft-tissue]
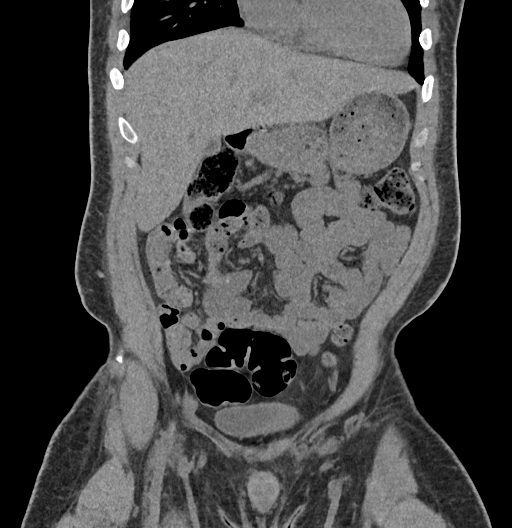
[im 71/159  soft-tissue]
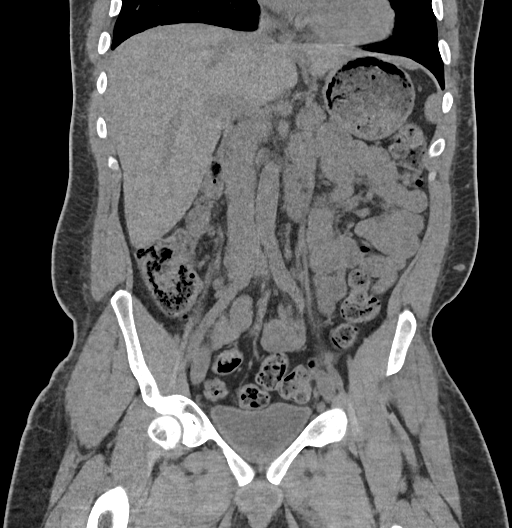
[im 88/159  soft-tissue]
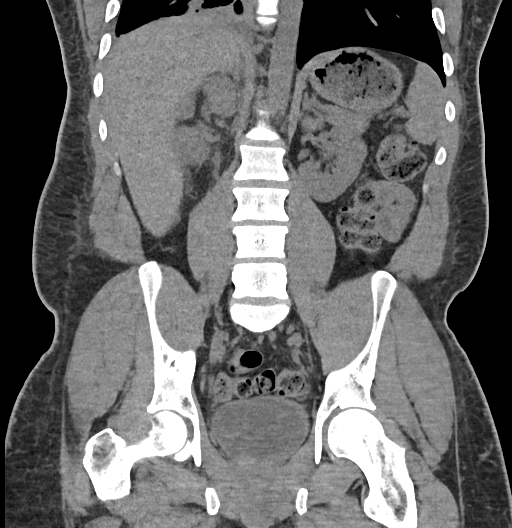

[12 of 46 positions shown; findings below may reference images not displayed]

FINDINGS: Lower chest: Small right pleural effusion, significantly decreased
from prior study. Improved consolidation/atelectasis at the right
lung base.

Hepatobiliary: No focal liver abnormality is seen. No gallstones,
gallbladder wall thickening, or biliary dilatation.

Pancreas: Unremarkable. No pancreatic ductal dilatation or
surrounding inflammatory changes.

Spleen: Normal in size without focal abnormality.

Adrenals/Urinary Tract: No adrenal mass. Unremarkable kidneys.
Stable pigtail drain catheter posterior to the right kidney. A few
small gas bubbles are noted in the retroperitoneum just medial to
the right kidney, significantly improved since previous. No residual
undrained fluid collection. Urinary bladder physiologically
distended.

Stomach/Bowel: Stomach is partially distended by ingested material.
Small bowel is nondilated. Normal appendix. The colon is nondilated.
Anastomotic staple line in the distal rectum.

Vascular/Lymphatic: No significant vascular findings are present. No
enlarged abdominal or pelvic lymph nodes.

Reproductive: Prostate is unremarkable.

Other: Stable left pelvic phlebolith.  No ascites.  No free air.

Musculoskeletal: No acute or significant osseous findings.
IMPRESSION: 1. Resolution of right retroperitoneal abscess post drain catheter
placement.
2. Resolving right pleural effusion with small residual.
3. No acute findings.

## 2021-09-16 NOTE — Progress Notes (Signed)
? ?Referring Physician(s): ?Andjela Wickes ? ?Chief Complaint: ?The patient is seen in follow up today s/p perinephric abscess with drain placement 08/26/21 by Dr. Juliette Alcide ? ?History of present illness: ?36 year old M with PMH of DM-1 with peripheral neuropathy, chronic pain on chronic opiate, tobacco use disorder, anxiety, BPD, E. coli bacteremia and COVID-19 infection in 06/2021, and recent hospitalization from 4/4-4/7 for hyperglycemia and ambulatory dysfunction when he left AMA returned to [facility] ED with hyperglycemia and falls x2, and admitted for hyperglycemia to 715, AKI, acute metabolic encephalopathy, possible pneumonia and uncontrolled hypertension.  He was started on insulin drip, IV ceftriaxone and IV fluid.  He also had elevated D-dimer in ED- s/p CTA chest -showed right renal and perinephric abscess, moderate right pleural effusion and possible septic emboli to RUL but negative for PE.  Antibiotics broadened to IV Zosyn.  Urology consulted.  CT abdomen and pelvis obtained confirmed renal abscess.  IR consulted, and drain placed with bloody output.  Abscess culture with ESBL E. Coli. ID recommended Invanz through 4/26 in house.  Patient had persistent moderate pleural effusion on the right with pleuritic chest pain underwent thoracentesis with 520 cc removal and feeling much improved after that.  He was discharged to skilled nursing facility in medically stable condition on 09/02/21 ? ?He presents to IR drain clinic for follow up appointment.  He reports feeling well, denies fever or chills.  Drain no longer has output and he is not flushing it..  Expresses pain at insertion site.  He has completed antibiotics and has follow up appointment with ID next week.   ? ? ?Past Medical History:  ?Diagnosis Date  ? Diabetes mellitus without complication (HCC)   ? Neuropathy   ? ? ?Past Surgical History:  ?Procedure Laterality Date  ? HEMORRHOID SURGERY    ? ? ?Allergies: ?Patient has no known  allergies. ? ?Medications: ?Prior to Admission medications   ?Medication Sig Start Date End Date Taking? Authorizing Provider  ?clonazePAM (KLONOPIN) 0.5 MG tablet Take 0.5 tablets (0.25 mg total) by mouth 2 (two) times daily for 4 doses. 09/02/21 09/04/21  Lanae Boast, MD  ?diclofenac Sodium (VOLTAREN) 1 % GEL Apply 4 g topically 4 (four) times daily as needed (Chest wall pain). 09/02/21   Lanae Boast, MD  ?feeding supplement, GLUCERNA SHAKE, (GLUCERNA SHAKE) LIQD Take 237 mLs by mouth 2 (two) times daily between meals. 09/02/21   Lanae Boast, MD  ?FLUoxetine (PROZAC) 20 MG capsule Take 20 mg by mouth daily as needed (anxiety attacks).    [provider]  ?hydrOXYzine (ATARAX) 25 MG tablet Take 1 tablet (25 mg total) by mouth every 6 (six) hours as needed for anxiety or itching. 09/02/21   Lanae Boast, MD  ?insulin aspart (NOVOLOG) 100 UNIT/ML injection Inject 4 Units into the skin 3 (three) times daily with meals. 09/02/21   Lanae Boast, MD  ?insulin detemir (LEVEMIR) 100 UNIT/ML injection Inject 0.25 mLs (25 Units total) into the skin 2 (two) times daily. 09/02/21   Lanae Boast, MD  ?oxyCODONE-acetaminophen (PERCOCET/ROXICET) 5-325 MG tablet Take 1 tablet by mouth every 6 (six) hours as needed for up to 4 doses for severe pain. 09/02/21   Lanae Boast, MD  ?pregabalin (LYRICA) 150 MG capsule Take 1 capsule (150 mg total) by mouth 3 (three) times daily for 4 doses. 09/02/21 09/04/21  Lanae Boast, MD  ?QUEtiapine (SEROQUEL) 100 MG tablet Take 100 mg by mouth at bedtime as needed (anxiety).    [provider]  ?senna-docusate (SENOKOT-S)  8.6-50 MG tablet Take 1 tablet by mouth 2 (two) times daily. 09/02/21   Lanae BoastKc, Ramesh, MD  ?traZODone (DESYREL) 150 MG tablet Take 1 tablet (150 mg total) by mouth at bedtime. 08/09/21   Mesner, Barbara CowerJason, MD  ?  ? ?No family history on file. ? ?Social History  ? ?Socioeconomic History  ? Marital status: Single  ?  Spouse name: Not on file  ? Number of children: Not on file  ? Years of  education: Not on file  ? Highest education level: Not on file  ?Occupational History  ? Not on file  ?Tobacco Use  ? Smoking status: Every Day  ?  Types: Cigarettes  ? Smokeless tobacco: Never  ?Vaping Use  ? Vaping Use: Never used  ?Substance and Sexual Activity  ? Alcohol use: Yes  ? Drug use: Never  ? Sexual activity: Not on file  ?Other Topics Concern  ? Not on file  ?Social History Narrative  ? Not on file  ? ?Social Determinants of Health  ? ?Financial Resource Strain: Not on file  ?Food Insecurity: Not on file  ?Transportation Needs: Not on file  ?Physical Activity: Not on file  ?Stress: Not on file  ?Social Connections: Not on file  ? ? ? ?Vital Signs: ?There were no vitals taken for this visit. ? ?Physical Exam ?Constitutional:   ?   Appearance: He is not ill-appearing.  ?HENT:  ?   Head: Normocephalic and atraumatic.  ?   Mouth/Throat:  ?   Pharynx: Oropharynx is clear.  ?Eyes:  ?   Extraocular Movements: Extraocular movements intact.  ?   Conjunctiva/sclera: Conjunctivae normal.  ?Pulmonary:  ?   Effort: Pulmonary effort is normal.  ?Abdominal:  ?   General: Abdomen is flat.  ?   Palpations: Abdomen is soft.  ?Skin: ?   General: Skin is warm and dry.  ?Neurological:  ?   General: No focal deficit present.  ?   Mental Status: He is alert and oriented to person, place, and time.  ?Psychiatric:     ?   Mood and Affect: Mood normal.     ?   Behavior: Behavior normal.  ? ?Drain: present in right flank with scant yellow OP in bag.  Site is not erythematous but slightly tender to the touch.  Drain, suture, and stat lock in place.   ? ?Imaging: ?Refer to same day CT imaging/report ? ?Labs: ? ?CBC: ?Recent Labs  ?  08/29/21 ?0434 08/30/21 ?0434 09/01/21 ?0506 09/02/21 ?0432  ?WBC 6.3 6.5 6.0 5.5  ?HGB 8.1* 8.0* 9.5* 8.1*  ?HCT 23.9* 24.7* 29.4* 24.4*  ?PLT 344 400 565* 573*  ? ? ?COAGS: ?No results for input(s): INR, APTT in the last 8760 hours. ? ?BMP: ?Recent Labs  ?  08/29/21 ?0434 08/30/21 ?0434  09/01/21 ?0506 09/02/21 ?0432  ?NA 135 134* 135 135  ?K 4.5 4.4 4.4 4.2  ?CL 99 98 97* 98  ?CO2 28 27 27 28   ?GLUCOSE 208* 153* 122* 123*  ?BUN 18 19 22* 24*  ?CALCIUM 8.8* 9.1 9.5 9.3  ?CREATININE 1.60* 1.46* 1.69* 1.61*  ?GFRNONAA 58* >60 54* 57*  ? ? ?LIVER FUNCTION TESTS: ?Recent Labs  ?  08/17/21 ?1411 08/20/21 ?1450 08/24/21 ?[id number] 08/26/21 ?0300 08/27/21 ?[id number] 08/28/21 ?0450 08/29/21 ?0434 08/30/21 ?[id number] 09/01/21 ?[id number]  ?BILITOT 0.5 0.1* 0.6 0.5  --   --   --   --   --   ?AST 19 16 21 25   --   --   --   --   --   ?  ALT 10 8 12 15   --   --   --   --   --   ?ALKPHOS 147* 113 150* 173*  --   --   --   --   --   ?PROT 8.7* 8.0 9.3* 6.7  --   --   --   --   --   ?ALBUMIN 3.1* 2.9* 3.2* 2.3*   < > 2.2* 2.1* 2.4* 2.8*  ? < > = values in this interval not displayed.  ? ? ?Assessment: ? ?Right perinephric abscess ?--clinically improved ?--CT shows much resolution ?--Drain removed without incident in clinic ?--Pt instructed to keep f/u appointments and to reach out with questions/concerns ? ?Signed: , PA ?09/16/2021, 2:12 PM ? ? ?Please refer to Dr. 11/16/2021 attestation of this note for management and plan.  ? ? ? ? ?  ?

## 2021-09-17 ENCOUNTER — Encounter: Payer: Self-pay | Admitting: *Deleted

## 2021-09-20 ENCOUNTER — Inpatient Hospital Stay: Payer: Medicare Other | Admitting: Internal Medicine

## 2021-09-23 ENCOUNTER — Inpatient Hospital Stay: Payer: Medicare Other | Admitting: Internal Medicine

## 2021-09-29 ENCOUNTER — Emergency Department (HOSPITAL_BASED_OUTPATIENT_CLINIC_OR_DEPARTMENT_OTHER): Payer: Medicare Other

## 2021-09-29 ENCOUNTER — Other Ambulatory Visit: Payer: Self-pay

## 2021-09-29 ENCOUNTER — Observation Stay (HOSPITAL_BASED_OUTPATIENT_CLINIC_OR_DEPARTMENT_OTHER)
Admission: EM | Admit: 2021-09-29 | Discharge: 2021-09-30 | Disposition: A | Discharge status: Home / Self Care | Payer: Medicare Other | Attending: Internal Medicine | Admitting: Internal Medicine

## 2021-09-29 ENCOUNTER — Encounter (HOSPITAL_BASED_OUTPATIENT_CLINIC_OR_DEPARTMENT_OTHER): Payer: Self-pay | Admitting: Emergency Medicine

## 2021-09-29 DIAGNOSIS — F39 Unspecified mood [affective] disorder: Secondary | ICD-10-CM | POA: Diagnosis not present

## 2021-09-29 DIAGNOSIS — E871 Hypo-osmolality and hyponatremia: Secondary | ICD-10-CM | POA: Insufficient documentation

## 2021-09-29 DIAGNOSIS — F17210 Nicotine dependence, cigarettes, uncomplicated: Secondary | ICD-10-CM | POA: Diagnosis not present

## 2021-09-29 DIAGNOSIS — E1100 Type 2 diabetes mellitus with hyperosmolarity without nonketotic hyperglycemic-hyperosmolar coma (NKHHC): Secondary | ICD-10-CM

## 2021-09-29 DIAGNOSIS — N179 Acute kidney failure, unspecified: Secondary | ICD-10-CM | POA: Diagnosis not present

## 2021-09-29 DIAGNOSIS — N151 Renal and perinephric abscess: Secondary | ICD-10-CM | POA: Insufficient documentation

## 2021-09-29 DIAGNOSIS — Z79899 Other long term (current) drug therapy: Secondary | ICD-10-CM | POA: Insufficient documentation

## 2021-09-29 DIAGNOSIS — Z794 Long term (current) use of insulin: Secondary | ICD-10-CM | POA: Insufficient documentation

## 2021-09-29 DIAGNOSIS — G894 Chronic pain syndrome: Secondary | ICD-10-CM | POA: Diagnosis not present

## 2021-09-29 DIAGNOSIS — E1065 Type 1 diabetes mellitus with hyperglycemia: Secondary | ICD-10-CM | POA: Diagnosis not present

## 2021-09-29 DIAGNOSIS — E1069 Type 1 diabetes mellitus with other specified complication: Secondary | ICD-10-CM

## 2021-09-29 DIAGNOSIS — R739 Hyperglycemia, unspecified: Secondary | ICD-10-CM | POA: Diagnosis present

## 2021-09-29 LAB — CBG MONITORING, ED
Glucose-Capillary: >600 mg/dL (ref 70–99)
Glucose-Capillary: >600 mg/dL (ref 70–99)
Glucose-Capillary: 499 mg/dL — ABNORMAL HIGH (ref 70–99)
Glucose-Capillary: 426 mg/dL — ABNORMAL HIGH (ref 70–99)
Glucose-Capillary: 260 mg/dL — ABNORMAL HIGH (ref 70–99)

## 2021-09-29 LAB — CBC WITH DIFFERENTIAL/PLATELET
Abs Immature Granulocytes: 0 10*3/uL (ref 0.00–0.07)
Basophils Absolute: 0 10*3/uL (ref 0.0–0.1)
Basophils Relative: 0 %
Eosinophils Absolute: 0 10*3/uL (ref 0.0–0.5)
Eosinophils Relative: 1 %
HCT: 32.2 % — ABNORMAL LOW (ref 39.0–52.0)
Hemoglobin: 10.8 g/dL — ABNORMAL LOW (ref 13.0–17.0)
Immature Granulocytes: 0 %
Lymphocytes Relative: 29 %
Lymphs Abs: 1.2 10*3/uL (ref 0.7–4.0)
MCH: 26.7 pg (ref 26.0–34.0)
MCHC: 33.5 g/dL (ref 30.0–36.0)
MCV: 79.7 fL — ABNORMAL LOW (ref 80.0–100.0)
Monocytes Absolute: 0.3 10*3/uL (ref 0.1–1.0)
Monocytes Relative: 7 %
Neutro Abs: 2.5 10*3/uL (ref 1.7–7.7)
Neutrophils Relative %: 63 %
Platelets: 234 10*3/uL (ref 150–400)
RBC: 4.04 MIL/uL — ABNORMAL LOW (ref 4.22–5.81)
RDW: 15 % (ref 11.5–15.5)
WBC: 4.1 10*3/uL (ref 4.0–10.5)
nRBC: 0 % (ref 0.0–0.2)

## 2021-09-29 LAB — URINALYSIS, ROUTINE W REFLEX MICROSCOPIC
Bilirubin Urine: NEGATIVE
Glucose, UA: >=500 mg/dL — AB
Hgb urine dipstick: NEGATIVE
Ketones, ur: NEGATIVE mg/dL
Leukocytes,Ua: NEGATIVE
Nitrite: NEGATIVE
Protein, ur: NEGATIVE mg/dL
Specific Gravity, Urine: <=1.005 (ref 1.005–1.030)
pH: 5 (ref 5.0–8.0)

## 2021-09-29 LAB — BASIC METABOLIC PANEL
Anion gap: 11 (ref 5–15)
Anion gap: 8 (ref 5–15)
BUN: 13 mg/dL (ref 6–20)
BUN: 11 mg/dL (ref 6–20)
CO2: 22 mmol/L (ref 22–32)
CO2: 27 mmol/L (ref 22–32)
Calcium: 8.8 mg/dL — ABNORMAL LOW (ref 8.9–10.3)
Calcium: 9.1 mg/dL (ref 8.9–10.3)
Chloride: 90 mmol/L — ABNORMAL LOW (ref 98–111)
Chloride: 96 mmol/L — ABNORMAL LOW (ref 98–111)
Creatinine, Ser: 1.61 mg/dL — ABNORMAL HIGH (ref 0.61–1.24)
Creatinine, Ser: 1.43 mg/dL — ABNORMAL HIGH (ref 0.61–1.24)
GFR, Estimated: 57 mL/min — ABNORMAL LOW (ref >60)
GFR, Estimated: >60 mL/min (ref >60)
Glucose, Bld: 841 mg/dL (ref 70–99)
Glucose, Bld: 375 mg/dL — ABNORMAL HIGH (ref 70–99)
Potassium: 4.3 mmol/L (ref 3.5–5.1)
Potassium: 3.4 mmol/L — ABNORMAL LOW (ref 3.5–5.1)
Sodium: 123 mmol/L — ABNORMAL LOW (ref 135–145)
Sodium: 131 mmol/L — ABNORMAL LOW (ref 135–145)

## 2021-09-29 LAB — URINALYSIS, MICROSCOPIC (REFLEX)

## 2021-09-29 LAB — I-STAT ARTERIAL BLOOD GAS, ED
Acid-base deficit: 1 mmol/L (ref 0.0–2.0)
Bicarbonate: 24 mmol/L (ref 20.0–28.0)
Calcium, Ion: 1.15 mmol/L (ref 1.15–1.40)
HCT: 38 % — ABNORMAL LOW (ref 39.0–52.0)
Hemoglobin: 12.9 g/dL — ABNORMAL LOW (ref 13.0–17.0)
O2 Saturation: 98 %
Patient temperature: 98
Potassium: 4.2 mmol/L (ref 3.5–5.1)
Sodium: 124 mmol/L — ABNORMAL LOW (ref 135–145)
TCO2: 25 mmol/L (ref 22–32)
pCO2 arterial: 38.2 mmHg (ref 32–48)
pH, Arterial: 7.404 (ref 7.35–7.45)
pO2, Arterial: 105 mmHg (ref 83–108)

## 2021-09-29 LAB — LACTIC ACID, PLASMA
Lactic Acid, Venous: 3.3 mmol/L (ref 0.5–1.9)
Lactic Acid, Venous: 3.2 mmol/L (ref 0.5–1.9)

## 2021-09-29 LAB — GLUCOSE, CAPILLARY
Glucose-Capillary: 127 mg/dL — ABNORMAL HIGH (ref 70–99)
Glucose-Capillary: 172 mg/dL — ABNORMAL HIGH (ref 70–99)

## 2021-09-29 IMAGING — DX DG CHEST 1V PORT
1 series · 1 of 1 positions shown · non-contrast
Comparison: Chest x-ray [DATE]

CLINICAL DATA: DKA.

EXAM:
PORTABLE CHEST 1 VIEW

[chest ap]
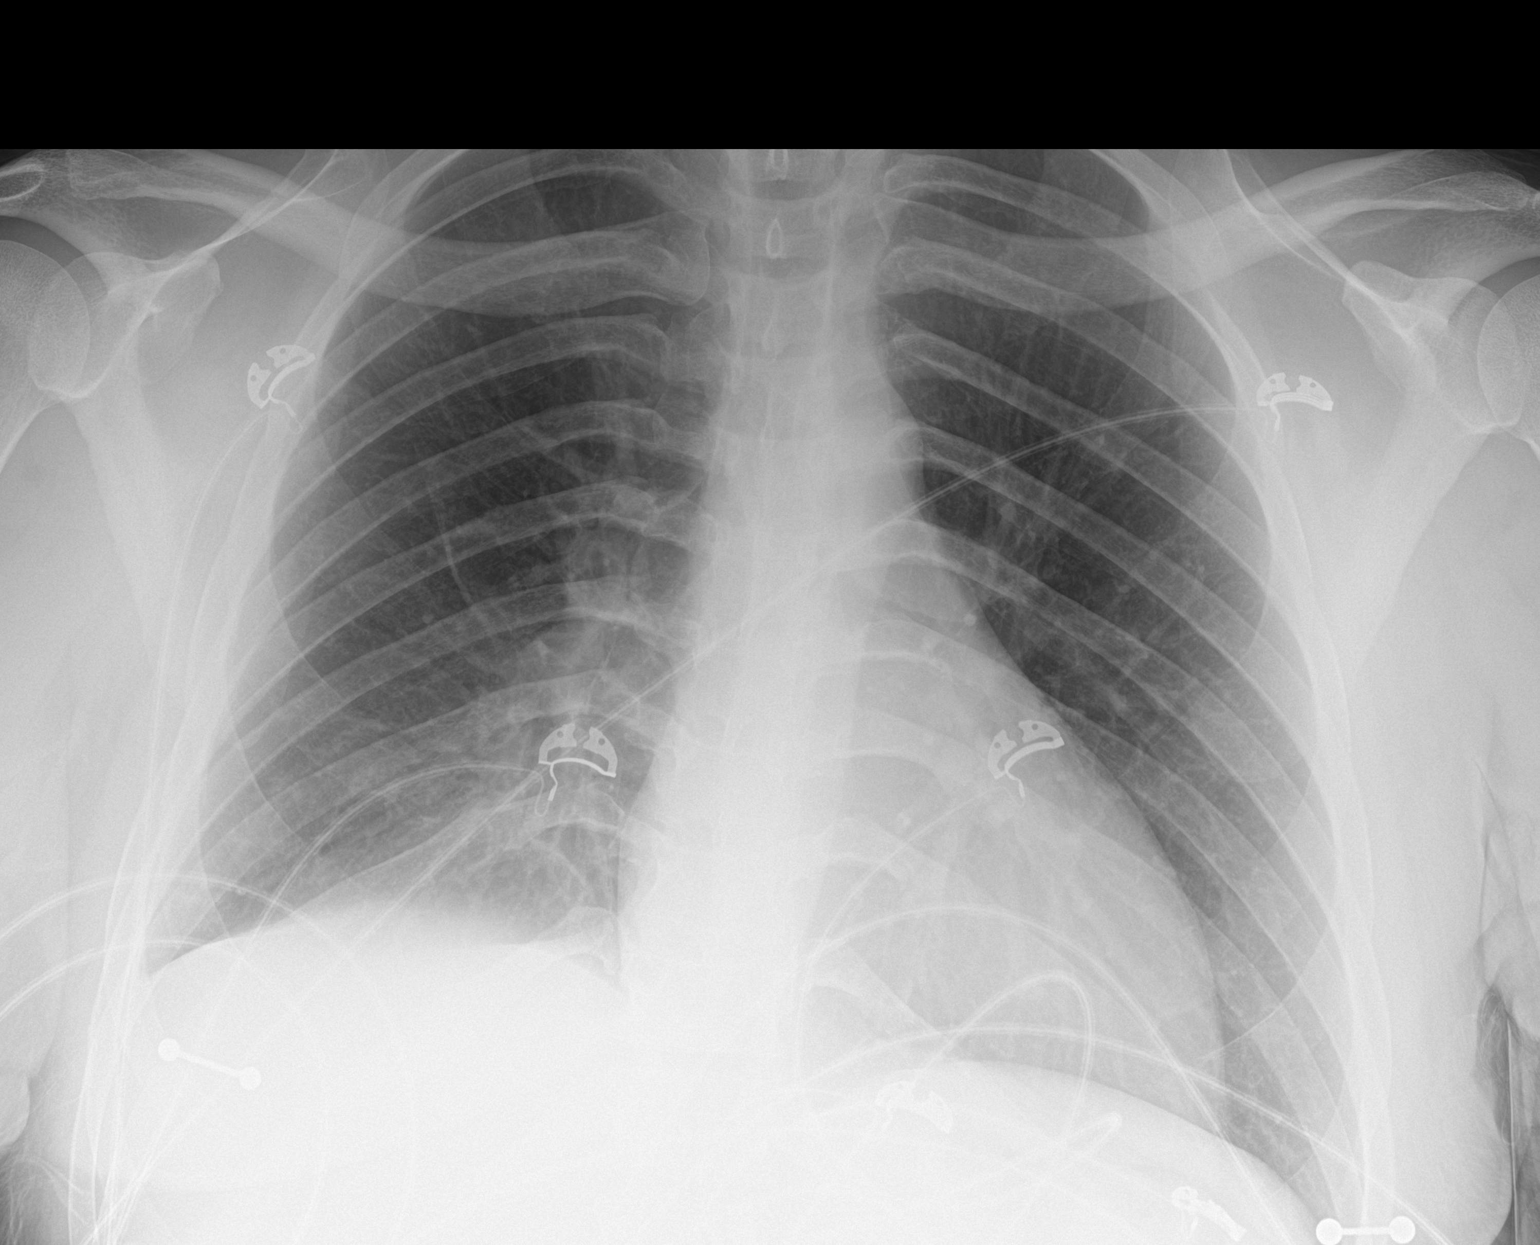

[1 of 1 positions shown; findings below may reference images not displayed]

FINDINGS: Small right pleural effusion and overlying atelectasis. The left
lung is clear. The heart is normal in size.
IMPRESSION: Small right pleural effusion and overlying atelectasis.

## 2021-09-29 IMAGING — CT CT ABD-PELV W/O CM
2 of 4 series · 16 of 46 positions shown, 18 images · non-contrast
Comparison: [DATE].

CLINICAL DATA: Back pain.  Right renal abscess.



[Series 2: axial st · axial · 0.98mm/px · z∈[-743,-298]mm · 13 of 99 slices shown, 15 images]
[im 5/99  soft-tissue]
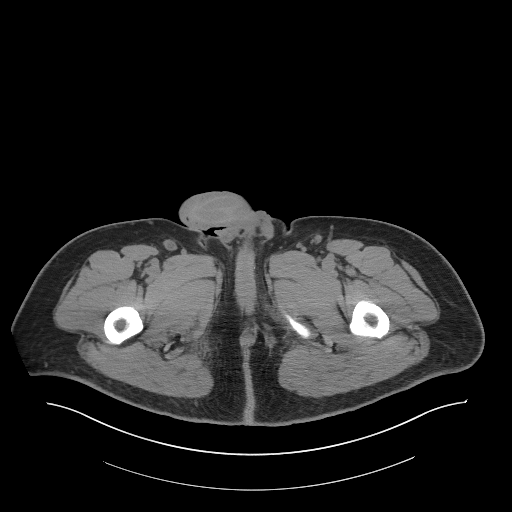
[im 5/99  bone]
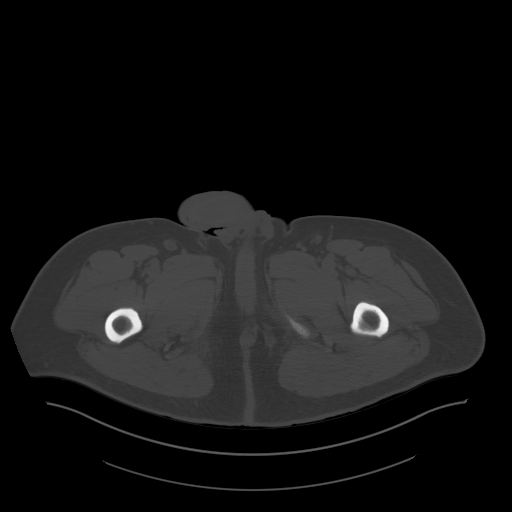
[im 13/99  soft-tissue]
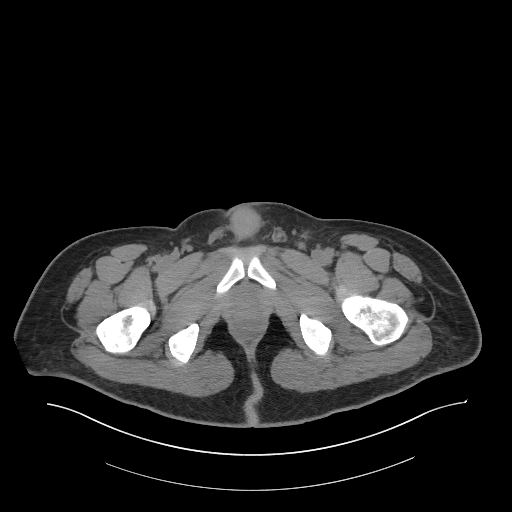
[im 21/99  soft-tissue]
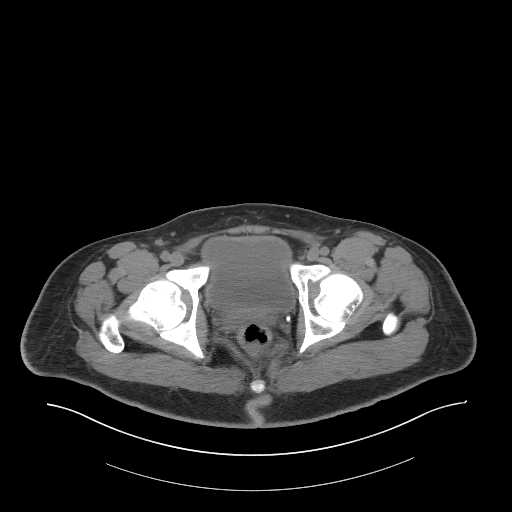
[im 29/99  soft-tissue]
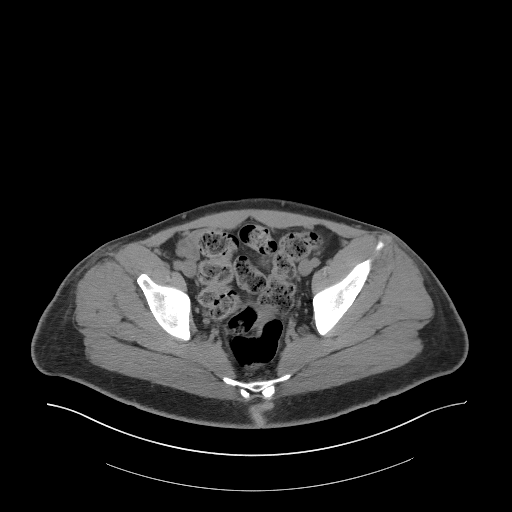
[im 33/99  soft-tissue]
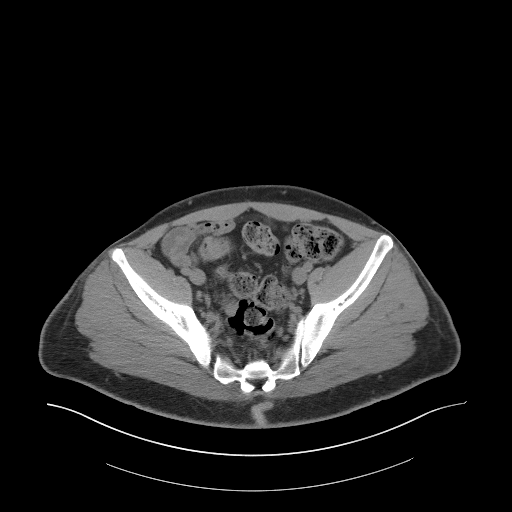
[im 41/99  soft-tissue]
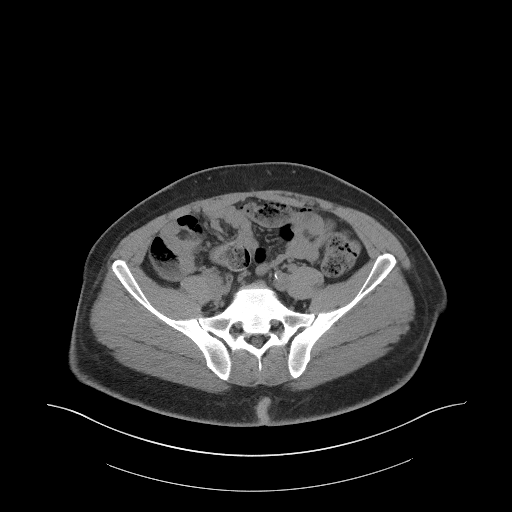
[im 50/99  soft-tissue]
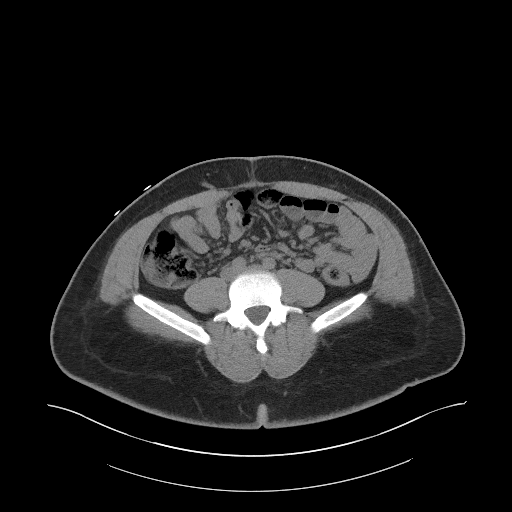
[im 58/99  soft-tissue]
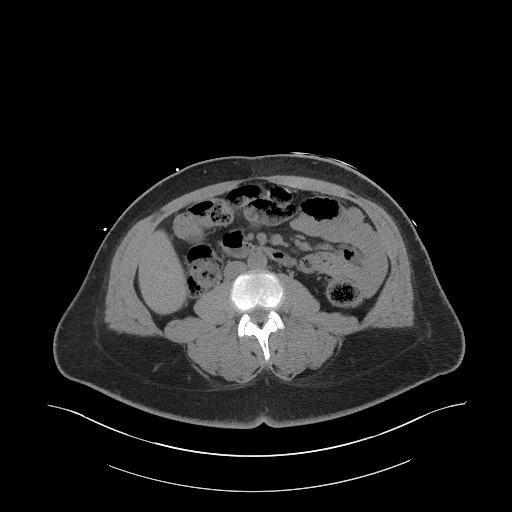
[im 66/99  soft-tissue]
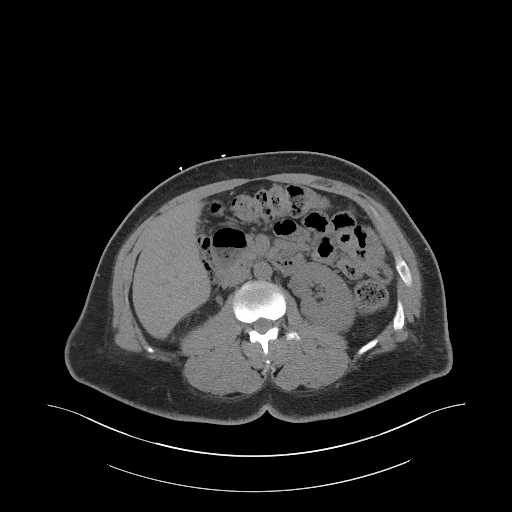
[im 66/99  bone]
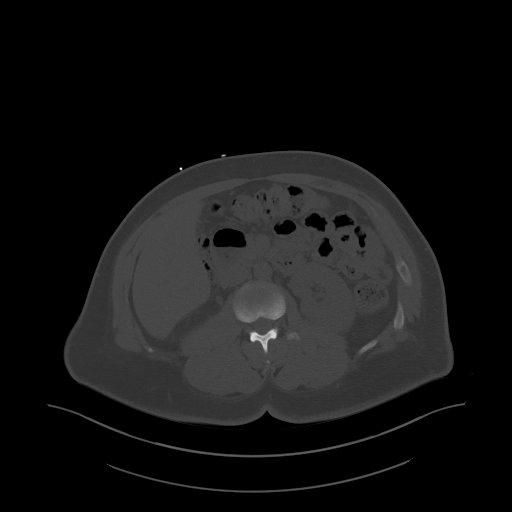
[im 70/99  soft-tissue]
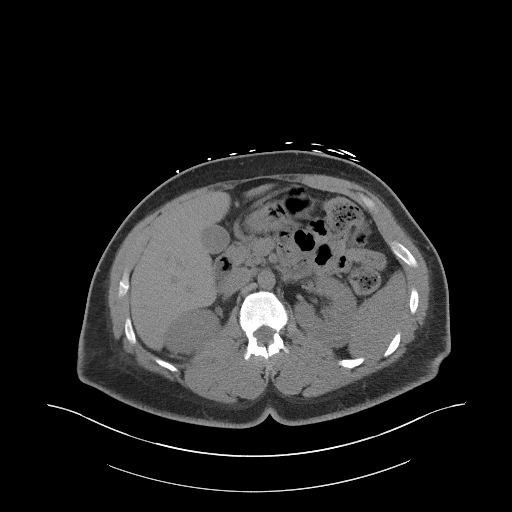
[im 78/99  soft-tissue]
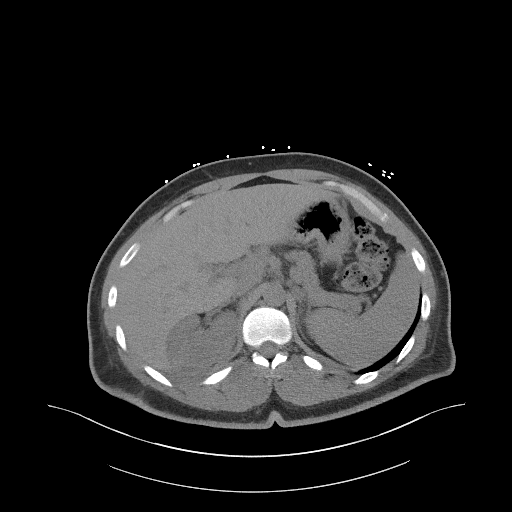
[im 86/99  soft-tissue]
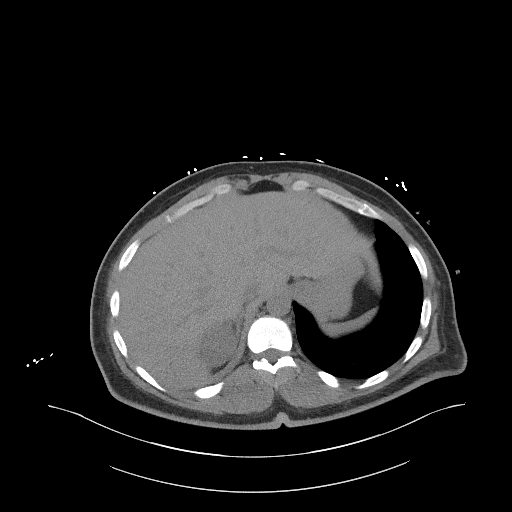
[im 94/99  soft-tissue]
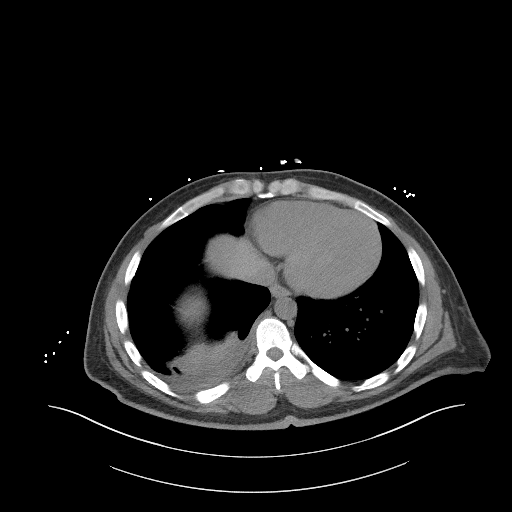

[Series 5: coronal st · coronal · 0.77mm/px · 3 of 85 slices shown]
[im 29/85  soft-tissue]
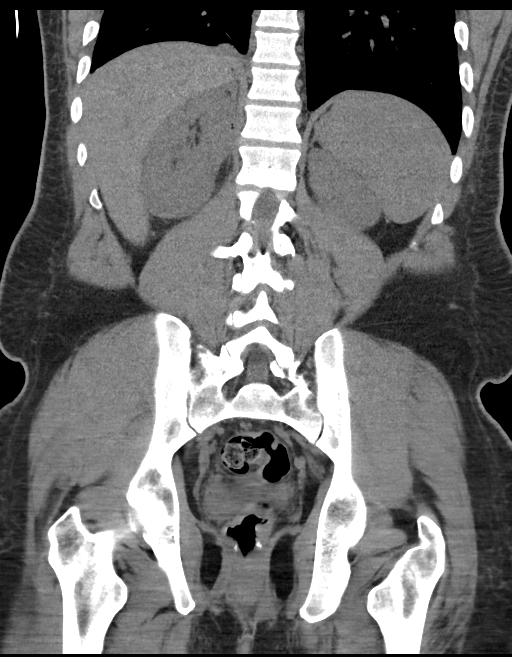
[im 38/85  soft-tissue]
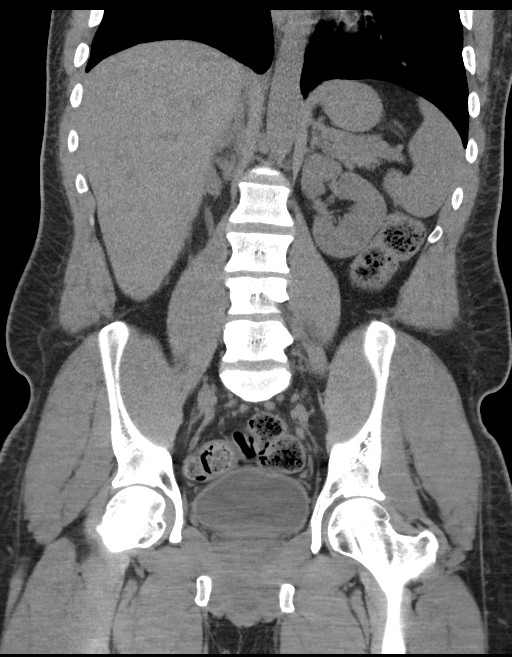
[im 47/85  soft-tissue]
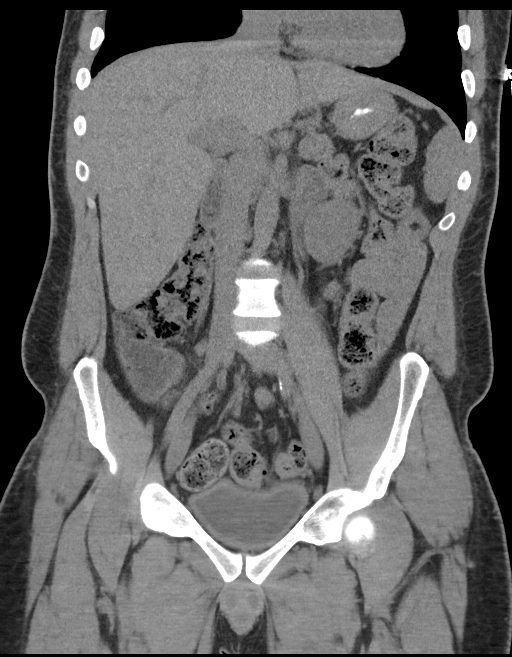

[16 of 46 positions shown; findings below may reference images not displayed]

FINDINGS: Lower chest: Small right pleural effusion is noted with adjacent
subsegmental atelectasis.

Hepatobiliary: No focal liver abnormality is seen. No gallstones,
gallbladder wall thickening, or biliary dilatation.

Pancreas: Unremarkable. No pancreatic ductal dilatation or
surrounding inflammatory changes.

Spleen: Normal in size without focal abnormality.

Adrenals/Urinary Tract: Adrenal glands are unremarkable. No
significant right renal fluid collection is noted consistent with
resolution of abscess status post drainage. No hydronephrosis or
renal obstruction is noted. Urinary bladder is unremarkable.

Stomach/Bowel: Stomach is within normal limits. Appendix appears
normal. No evidence of bowel wall thickening, distention, or
inflammatory changes.

Vascular/Lymphatic: No significant vascular findings are present. No
enlarged abdominal or pelvic lymph nodes.

Reproductive: Prostate is unremarkable.

Other: No abdominal wall hernia or abnormality. No abdominopelvic
ascites.

Musculoskeletal: No acute or significant osseous findings.
IMPRESSION: Small right pleural effusion with adjacent subsegmental atelectasis.

No evidence of recurrent right perinephric fluid collection or
abscess.

## 2021-09-29 MED ORDER — INSULIN REGULAR(HUMAN) IN NACL 100-0.9 UT/100ML-% IV SOLN
INTRAVENOUS | Status: DC
  Administered 2021-09-29: 10.5 [IU]/h via INTRAVENOUS
  Filled 2021-09-29: qty 100

## 2021-09-29 MED ORDER — DEXTROSE 50 % IV SOLN: 0.0000 mL | INTRAVENOUS | Status: DC | PRN

## 2021-09-29 MED ORDER — POTASSIUM CHLORIDE 10 MEQ/100ML IV SOLN
10.0000 meq | INTRAVENOUS | Status: AC
  Administered 2021-09-29 – 2021-09-30 (×3): 10 meq via INTRAVENOUS
  Filled 2021-09-29 (×3): qty 100

## 2021-09-29 MED ORDER — LORAZEPAM 2 MG/ML IJ SOLN
1.0000 mg | Freq: Once | INTRAMUSCULAR | Status: AC
  Administered 2021-09-29: 1 mg via INTRAVENOUS
  Filled 2021-09-29: qty 1

## 2021-09-29 MED ORDER — SODIUM CHLORIDE 0.9 % IV BOLUS: 1000.0000 mL | Freq: Once | INTRAVENOUS | Status: DC

## 2021-09-29 MED ORDER — POTASSIUM CHLORIDE 10 MEQ/100ML IV SOLN
10.0000 meq | INTRAVENOUS | Status: AC
  Administered 2021-09-29 (×2): 10 meq via INTRAVENOUS
  Filled 2021-09-29 (×2): qty 100

## 2021-09-29 MED ORDER — LACTATED RINGERS IV BOLUS
20.0000 mL/kg | Freq: Once | INTRAVENOUS | Status: AC
  Administered 2021-09-29: 1989 mL via INTRAVENOUS

## 2021-09-29 MED ORDER — MORPHINE SULFATE (PF) 4 MG/ML IV SOLN
4.0000 mg | Freq: Once | INTRAVENOUS | Status: AC
  Administered 2021-09-29: 4 mg via INTRAVENOUS
  Filled 2021-09-29: qty 1

## 2021-09-29 MED ORDER — LACTATED RINGERS IV SOLN: INTRAVENOUS | Status: DC

## 2021-09-29 MED ORDER — CHLORHEXIDINE GLUCONATE CLOTH 2 % EX PADS
6.0000 | MEDICATED_PAD | Freq: Every day | CUTANEOUS | Status: DC
  Administered 2021-09-29: 6 via TOPICAL
  Filled 2021-09-29: qty 6

## 2021-09-29 MED ORDER — DEXTROSE IN LACTATED RINGERS 5 % IV SOLN: INTRAVENOUS | Status: DC

## 2021-09-29 NOTE — ED Notes (Signed)
[facility] notified for pt transfer 

## 2021-09-29 NOTE — ED Triage Notes (Signed)
States his sugars have been hig x 2 days just had tube removed from kidney after surgery last week , has been dizzy and states fell last night has neuropathy ?

## 2021-09-29 NOTE — ED Provider Notes (Signed)
?[facility] EMERGENCY DEPARTMENT ?Provider Note ? ? ?CSN: [id number] ?Arrival date & time: 09/29/21  1741 ? ?  ? ?History ? ?Chief Complaint  ?Patient presents with  ? Hyperglycemia  ? ? ?Victor Matthews is a 36 y.o. male. ? ?Patient is a 36 year old male with a history of poorly controlled diabetes, chronic pain and neuropathy which resulting multiple falls, bipolar disorder.  He has had several recent admissions to the hospital.  He was admitted from April 11 to April 20 for hyperglycemia and was subsequently found to have a right renal abscess that required IR drainage.  He completed a course of Invanz on April 26.  He was readmitted to [facility] from 4/21-4/23 after he left AMA from his skilled nursing facility.  He continued antibiotics and was treated for hyperglycemia.  He states his blood sugars have been running high.  He had his drain removed last week.  He denies any fevers.  He does have some dizziness.  He has had some falls related to his neuropathy.  This is not unusual for him.  No recent injuries.  No nausea or vomiting.  No chest pain or shortness of breath.  He still has some pain around his right kidney. ? ? ?  ? ?Home Medications ?Prior to Admission medications   ?Medication Sig Start Date End Date Taking? Authorizing Provider  ?clonazePAM (KLONOPIN) 0.5 MG tablet Take 0.5 tablets (0.25 mg total) by mouth 2 (two) times daily for 4 doses. 09/02/21 09/04/21  Lanae BoastKc, Ramesh, MD  ?diclofenac Sodium (VOLTAREN) 1 % GEL Apply 4 g topically 4 (four) times daily as needed (Chest wall pain). 09/02/21   Lanae BoastKc, Ramesh, MD  ?feeding supplement, GLUCERNA SHAKE, (GLUCERNA SHAKE) LIQD Take 237 mLs by mouth 2 (two) times daily between meals. 09/02/21   Lanae BoastKc, Ramesh, MD  ?FLUoxetine (PROZAC) 20 MG capsule Take 20 mg by mouth daily as needed (anxiety attacks).    [provider]  ?hydrOXYzine (ATARAX) 25 MG tablet Take 1 tablet (25 mg total) by mouth every 6 (six) hours as needed for anxiety or  itching. 09/02/21   Lanae BoastKc, Ramesh, MD  ?insulin aspart (NOVOLOG) 100 UNIT/ML injection Inject 4 Units into the skin 3 (three) times daily with meals. 09/02/21   Lanae BoastKc, Ramesh, MD  ?insulin detemir (LEVEMIR) 100 UNIT/ML injection Inject 0.25 mLs (25 Units total) into the skin 2 (two) times daily. 09/02/21   Lanae BoastKc, Ramesh, MD  ?oxyCODONE-acetaminophen (PERCOCET/ROXICET) 5-325 MG tablet Take 1 tablet by mouth every 6 (six) hours as needed for up to 4 doses for severe pain. 09/02/21   Lanae BoastKc, Ramesh, MD  ?pregabalin (LYRICA) 150 MG capsule Take 1 capsule (150 mg total) by mouth 3 (three) times daily for 4 doses. 09/02/21 09/04/21  Lanae BoastKc, Ramesh, MD  ?QUEtiapine (SEROQUEL) 100 MG tablet Take 100 mg by mouth at bedtime as needed (anxiety).    [provider]  ?senna-docusate (SENOKOT-S) 8.6-50 MG tablet Take 1 tablet by mouth 2 (two) times daily. 09/02/21   Lanae BoastKc, Ramesh, MD  ?traZODone (DESYREL) 150 MG tablet Take 1 tablet (150 mg total) by mouth at bedtime. 08/09/21   Mesner, Barbara CowerJason, MD  ?   ? ?Allergies    ?Patient has no known allergies.   ? ?Review of Systems   ?Review of Systems  ?Constitutional:  Positive for fatigue. Negative for chills, diaphoresis and fever.  ?HENT:  Negative for congestion, rhinorrhea and sneezing.   ?Eyes: Negative.   ?Respiratory:  Negative for cough, chest tightness and  shortness of breath.   ?Cardiovascular:  Negative for chest pain and leg swelling.  ?Gastrointestinal:  Negative for abdominal pain, blood in stool, diarrhea, nausea and vomiting.  ?Genitourinary:  Negative for difficulty urinating, flank pain, frequency and hematuria.  ?Musculoskeletal:  Positive for back pain. Negative for arthralgias.  ?Skin:  Negative for rash.  ?Neurological:  Positive for light-headedness. Negative for dizziness, speech difficulty, weakness, numbness and headaches.  ? ?Physical Exam ?Updated Vital Signs ?BP 124/75   Pulse 88   Temp 98.7 ?F (37.1 ?C) (Oral)   Resp (!) 25   Ht 6' (1.829 m)   Wt 99.3 kg   SpO2 100%    BMI 29.70 kg/m?  ?Physical Exam ?Constitutional:   ?   Appearance: He is well-developed.  ?HENT:  ?   Head: Normocephalic and atraumatic.  ?Eyes:  ?   Pupils: Pupils are equal, round, and reactive to light.  ?Cardiovascular:  ?   Rate and Rhythm: Normal rate and regular rhythm.  ?   Heart sounds: Normal heart sounds.  ?Pulmonary:  ?   Effort: Pulmonary effort is normal. No respiratory distress.  ?   Breath sounds: Normal breath sounds. No wheezing or rales.  ?Chest:  ?   Chest wall: No tenderness.  ?Abdominal:  ?   General: Bowel sounds are normal.  ?   Palpations: Abdomen is soft.  ?   Tenderness: There is no abdominal tenderness. There is no guarding or rebound.  ?   Comments: Small wound that is healing over in his right mid back.  Some small amount of tenderness to this area.  ?Musculoskeletal:     ?   General: Normal range of motion.  ?   Cervical back: Normal range of motion and neck supple.  ?Lymphadenopathy:  ?   Cervical: No cervical adenopathy.  ?Skin: ?   General: Skin is warm and dry.  ?   Findings: No rash.  ?Neurological:  ?   Mental Status: He is alert and oriented to person, place, and time.  ? ? ?ED Results / Procedures / Treatments   ?Labs ?(all labs ordered are listed, but only abnormal results are displayed) ?Labs Reviewed  ?BASIC METABOLIC PANEL - Abnormal; Notable for the following components:  ?    Result Value  ? Sodium 123 (*)   ? Chloride 90 (*)   ? Glucose, Bld 841 (*)   ? Creatinine, Ser 1.61 (*)   ? Calcium 8.8 (*)   ? GFR, Estimated 57 (*)   ? All other components within normal limits  ?LACTIC ACID, PLASMA - Abnormal; Notable for the following components:  ? Lactic Acid, Venous 3.3 (*)   ? All other components within normal limits  ?LACTIC ACID, PLASMA - Abnormal; Notable for the following components:  ? Lactic Acid, Venous 3.2 (*)   ? All other components within normal limits  ?CBC WITH DIFFERENTIAL/PLATELET - Abnormal; Notable for the following components:  ? RBC 4.04 (*)   ?  Hemoglobin 10.8 (*)   ? HCT 32.2 (*)   ? MCV 79.7 (*)   ? All other components within normal limits  ?URINALYSIS, ROUTINE W REFLEX MICROSCOPIC - Abnormal; Notable for the following components:  ? Glucose, UA >=500 (*)   ? All other components within normal limits  ?URINALYSIS, MICROSCOPIC (REFLEX) - Abnormal; Notable for the following components:  ? Bacteria, UA FEW (*)   ? All other components within normal limits  ?BASIC METABOLIC PANEL - Abnormal; Notable for the  following components:  ? Sodium 131 (*)   ? Potassium 3.4 (*)   ? Chloride 96 (*)   ? Glucose, Bld 375 (*)   ? Creatinine, Ser 1.43 (*)   ? All other components within normal limits  ?CBG MONITORING, ED - Abnormal; Notable for the following components:  ? Glucose-Capillary >600 (*)   ? All other components within normal limits  ?I-STAT ARTERIAL BLOOD GAS, ED - Abnormal; Notable for the following components:  ? Sodium 124 (*)   ? HCT 38.0 (*)   ? Hemoglobin 12.9 (*)   ? All other components within normal limits  ?CBG MONITORING, ED - Abnormal; Notable for the following components:  ? Glucose-Capillary >600 (*)   ? All other components within normal limits  ?CBG MONITORING, ED - Abnormal; Notable for the following components:  ? Glucose-Capillary 499 (*)   ? All other components within normal limits  ?CBG MONITORING, ED - Abnormal; Notable for the following components:  ? Glucose-Capillary 426 (*)   ? All other components within normal limits  ?CBG MONITORING, ED - Abnormal; Notable for the following components:  ? Glucose-Capillary 260 (*)   ? All other components within normal limits  ?MRSA NEXT GEN BY PCR, NASAL  ?BETA-HYDROXYBUTYRIC ACID  ?OSMOLALITY  ? ? ?EKG ?EKG Interpretation ? ?Date/Time:  Wednesday Sep 29 2021 18:35:06 EDT ?Ventricular Rate:  83 ?PR Interval:  174 ?QRS Duration: 95 ?QT Interval:  359 ?QTC Calculation: 422 ?R Axis:   0 ?Text Interpretation: Sinus rhythm ST elevation suggests acute pericarditis similar to EKG from 08/17/2021  Confirmed by Rolan Bucco [phone]) on 09/29/2021 6:38:20 PM ? ?Radiology ?CT Abdomen Pelvis Wo Contrast ? ?Result Date: 09/29/2021 ?CLINICAL DATA:  Back pain.  Right renal abscess. EXAM: CT ABDOMEN AND PELVIS WITHOUT CONTRAST TEC

## 2021-09-30 DIAGNOSIS — E1100 Type 2 diabetes mellitus with hyperosmolarity without nonketotic hyperglycemic-hyperosmolar coma (NKHHC): Secondary | ICD-10-CM | POA: Diagnosis not present

## 2021-09-30 DIAGNOSIS — E1065 Type 1 diabetes mellitus with hyperglycemia: Secondary | ICD-10-CM | POA: Diagnosis not present

## 2021-09-30 DIAGNOSIS — N179 Acute kidney failure, unspecified: Secondary | ICD-10-CM

## 2021-09-30 LAB — GLUCOSE, CAPILLARY
Glucose-Capillary: 151 mg/dL — ABNORMAL HIGH (ref 70–99)
Glucose-Capillary: 159 mg/dL — ABNORMAL HIGH (ref 70–99)
Glucose-Capillary: 162 mg/dL — ABNORMAL HIGH (ref 70–99)
Glucose-Capillary: 165 mg/dL — ABNORMAL HIGH (ref 70–99)
Glucose-Capillary: 231 mg/dL — ABNORMAL HIGH (ref 70–99)
Glucose-Capillary: 251 mg/dL — ABNORMAL HIGH (ref 70–99)
Glucose-Capillary: 274 mg/dL — ABNORMAL HIGH (ref 70–99)
Glucose-Capillary: 279 mg/dL — ABNORMAL HIGH (ref 70–99)
Glucose-Capillary: 293 mg/dL — ABNORMAL HIGH (ref 70–99)
Glucose-Capillary: 296 mg/dL — ABNORMAL HIGH (ref 70–99)
Glucose-Capillary: 317 mg/dL — ABNORMAL HIGH (ref 70–99)
Glucose-Capillary: 378 mg/dL — ABNORMAL HIGH (ref 70–99)

## 2021-09-30 LAB — BASIC METABOLIC PANEL
Anion gap: 8 (ref 5–15)
BUN: 10 mg/dL (ref 6–20)
CO2: 27 mmol/L (ref 22–32)
Calcium: 8.8 mg/dL — ABNORMAL LOW (ref 8.9–10.3)
Chloride: 100 mmol/L (ref 98–111)
Creatinine, Ser: 1.31 mg/dL — ABNORMAL HIGH (ref 0.61–1.24)
GFR, Estimated: >60 mL/min (ref >60)
Glucose, Bld: 322 mg/dL — ABNORMAL HIGH (ref 70–99)
Potassium: 3.8 mmol/L (ref 3.5–5.1)
Sodium: 135 mmol/L (ref 135–145)

## 2021-09-30 LAB — BETA-HYDROXYBUTYRIC ACID: Beta-Hydroxybutyric Acid: 0.15 mmol/L (ref 0.05–0.27)

## 2021-09-30 LAB — OSMOLALITY: Osmolality: 306 mOsm/kg — ABNORMAL HIGH (ref 275–295)

## 2021-09-30 LAB — MRSA NEXT GEN BY PCR, NASAL: MRSA by PCR Next Gen: DETECTED — AB

## 2021-09-30 LAB — HIV ANTIBODY (ROUTINE TESTING W REFLEX): HIV Screen 4th Generation wRfx: NONREACTIVE

## 2021-09-30 LAB — LACTIC ACID, PLASMA
Lactic Acid, Venous: 2.5 mmol/L (ref 0.5–1.9)
Lactic Acid, Venous: 2.9 mmol/L (ref 0.5–1.9)

## 2021-09-30 MED ORDER — INSULIN DETEMIR 100 UNIT/ML ~~LOC~~ SOLN
25.0000 [IU] | Freq: Every day | SUBCUTANEOUS | Status: DC
  Administered 2021-09-30: 25 [IU] via SUBCUTANEOUS
  Filled 2021-09-30: qty 0.25

## 2021-09-30 MED ORDER — INSULIN DETEMIR 100 UNIT/ML ~~LOC~~ SOLN
10.0000 [IU] | Freq: Every day | SUBCUTANEOUS | 11 refills | Status: AC
Start: 2021-09-30 — End: ?

## 2021-09-30 MED ORDER — INSULIN ASPART 100 UNIT/ML IJ SOLN
0.0000 [IU] | Freq: Three times a day (TID) | INTRAMUSCULAR | Status: DC
  Administered 2021-09-30: 4 [IU] via SUBCUTANEOUS

## 2021-09-30 MED ORDER — MUPIROCIN 2 % EX OINT
1.0000 "application " | TOPICAL_OINTMENT | Freq: Two times a day (BID) | CUTANEOUS | Status: DC
  Administered 2021-09-30 (×2): 1 via NASAL
  Filled 2021-09-30: qty 22

## 2021-09-30 MED ORDER — INSULIN ASPART 100 UNIT/ML IJ SOLN
6.0000 [IU] | Freq: Three times a day (TID) | INTRAMUSCULAR | Status: DC
  Administered 2021-09-30: 6 [IU] via SUBCUTANEOUS

## 2021-09-30 MED ORDER — ENOXAPARIN SODIUM 40 MG/0.4ML IJ SOSY
40.0000 mg | PREFILLED_SYRINGE | INTRAMUSCULAR | Status: DC
  Administered 2021-09-30: 40 mg via SUBCUTANEOUS
  Filled 2021-09-30: qty 0.4

## 2021-09-30 MED ORDER — INSULIN ASPART PROT & ASPART (70-30 MIX) 100 UNIT/ML ~~LOC~~ SUSP: 35.0000 [IU] | Freq: Three times a day (TID) | SUBCUTANEOUS | Status: DC

## 2021-09-30 MED ORDER — INSULIN ASPART PROT & ASPART (70-30 MIX) 100 UNIT/ML ~~LOC~~ SUSP: 35.0000 [IU] | Freq: Three times a day (TID) | SUBCUTANEOUS | 11 refills | Status: DC

## 2021-09-30 MED ORDER — PREGABALIN 100 MG PO CAPS
200.0000 mg | ORAL_CAPSULE | Freq: Once | ORAL | Status: AC
  Administered 2021-09-30: 200 mg via ORAL
  Filled 2021-09-30: qty 2

## 2021-09-30 MED ORDER — INSULIN ASPART 100 UNIT/ML IJ SOLN: 0.0000 [IU] | Freq: Every day | INTRAMUSCULAR | Status: DC

## 2021-09-30 NOTE — Assessment & Plan Note (Signed)
Resume home medication pending med rec

## 2021-09-30 NOTE — Assessment & Plan Note (Signed)
Presented with creatinine of 1.61.  Continue to monitor with aggressive IV fluid hydration overnight.

## 2021-09-30 NOTE — Assessment & Plan Note (Signed)
In the setting of uncontrolled type 1 diabetes.  Previously was discharged from [facility] on Levemir 25 units twice daily, NovoLog 4 units 3 times daily and SSI.  However tells me at home he has been on 45 units of Levemir daily and 35 units of NovoLog daily. -- replete potassium - continue insulin gtt with goal of 140-180  - IV NS until BG <250, then switch to D5 1/2 NS  - BMP q4hr  - Pt adamant about having a diet. Since his BG was significantly improved on arrival, allowed for carb modified diet.

## 2021-09-30 NOTE — TOC Initial Note (Signed)
Transition of Care [facility]) - Initial/Assessment Note    Patient Details  Name: Victor Matthews MRN: [id number] Date of Birth: 08-25-1985  Transition of Care [facility]) CM/SW Contact:    Golda Acre, RN Phone Number: 09/30/2021, 9:22 AM  Clinical Narrative:                  Transition of Care [facility]) Screening Note   Patient Details  Name: Victor Matthews Date of Birth: Dec 27, 1985   Transition of Care [facility]) CM/SW Contact:    Golda Acre, RN Phone Number: 09/30/2021, 9:22 AM    Transition of Care Department [facility]) has reviewed patient and no TOC needs have been identified at this time. We will continue to monitor patient advancement through interdisciplinary progression rounds. If new patient transition needs arise, please place a TOC consult.    Expected Discharge Plan: Home/Self Care Barriers to Discharge: No Barriers Identified   Patient Goals and CMS Choice Patient states their goals for this hospitalization and ongoing recovery are:: to go home CMS Medicare.gov Compare Post Acute Care list provided to:: Patient    Expected Discharge Plan and Services Expected Discharge Plan: Home/Self Care   Discharge Planning Services: CM Consult   Living arrangements for the past 2 months: Single Family Home                                      Prior Living Arrangements/Services Living arrangements for the past 2 months: Single Family Home Lives with:: Self Patient language and need for interpreter reviewed:: Yes Do you feel safe going back to the place where you live?: Yes            Criminal Activity/Legal Involvement Pertinent to Current Situation/Hospitalization: No - Comment as needed  Activities of Daily Living Home Assistive Devices/Equipment: None ADL Screening (condition at time of admission) Patient's cognitive ability adequate to safely complete daily activities?: Yes Is the patient deaf or have difficulty hearing?: No Does the patient have  difficulty seeing, even when wearing glasses/contacts?: No Does the patient have difficulty concentrating, remembering, or making decisions?: No Patient able to express need for assistance with ADLs?: No Does the patient have difficulty dressing or bathing?: No Independently performs ADLs?: Yes (appropriate for developmental age) Does the patient have difficulty walking or climbing stairs?: Yes Weakness of Legs: Both Weakness of Arms/Hands: None  Permission Sought/Granted                  Emotional Assessment Appearance:: Appears stated age     Orientation: : Oriented to Self, Oriented to Place, Oriented to  Time, Oriented to Situation Alcohol / Substance Use: Not Applicable Psych Involvement: No (comment)  Admission diagnosis:  Hyponatremia [E87.1] Hyperglycemia [R73.9] Type 1 diabetes mellitus with hyperosmolar hyperglycemic state (HHS) (HCC) [E10.69, E10.65, E87.0] Patient Active Problem List   Diagnosis Date Noted   Hyperosmolar hyperglycemic state (HHS) (HCC) 09/30/2021   AKI (acute kidney injury) (HCC) 09/30/2021   Tobacco use disorder 08/31/2021   Grief 08/30/2021   Hypokalemia and hyponatremia 08/29/2021   Kidney, perinephric abscess 08/29/2021   Renal abscess, right 08/29/2021   Normocytic anemia 08/26/2021   Elevated d-dimer 08/25/2021   History of anxiety/bipolar disorder 08/25/2021   Pleural effusion on right 08/25/2021   Possible RUL septic emboli 08/25/2021   Uncontrolled type 1 diabetes mellitus with hyperglycemia, with long-term current use of insulin (HCC) 08/24/2021   Dehydration 08/24/2021  Elevated serum creatinine 08/24/2021   Sepsis (HCC) 08/24/2021   Atypical chest pain 08/24/2021   Acute metabolic encephalopathy 08/24/2021   Fever 08/24/2021   Right facial numbness 08/19/2021   Abnormal MRI, kidney 08/19/2021   Fall due to ambulatory dysfunction 08/18/2021   Neuropathy 08/18/2021   History of bacteremia 08/18/2021   History of COVID-19  08/18/2021   Unresponsive episode 08/18/2021   Chronic pain syndrome 08/17/2021   PCP:  System, Provider Not In Pharmacy:   [facility] [address], [address] [place] [address] Phone: [phone] Fax: [phone]  [facility], [place] - [address] [facility] [address] [place] [address] Phone: [phone] Fax: [phone]     Social Determinants of Health (SDOH) Interventions    Readmission Risk Interventions    08/25/2021    3:07 PM  Readmission Risk Prevention Plan  Transportation Screening Complete  PCP or Specialist Appt within 5-7 Days Complete  PCP or Specialist Appt within 3-5 Days Not Complete  Not Complete comments Patient will discharge to SNF.  HRI or Home Care Consult Complete  Social Work Consult for Recovery Care Planning/Counseling Complete  Palliative Care Screening Not Applicable  Medication Review Oceanographer) Complete

## 2021-09-30 NOTE — H&P (Signed)
History and Physical    Patient: Victor Matthews SPQ:[id number] DOB: Feb 01, 1986 DOA: 09/29/2021 DOS: the patient was seen and examined on 09/30/2021 PCP: System, Provider Not In  Patient coming from: Outside Hospital [facility]  Chief Complaint:  Chief Complaint  Patient presents with   Hyperglycemia   HPI: Victor Matthews is a 36 y.o. male with medical history significant of poorly controlled type 1 diabetes, peripheral neuropathy, chronic pain on opioids, bipolar disorder, ambulatory dysfunction who presents here with hyperglycemia.  States that his sugar at home has been "high" but reports compliance with his insulin although he is running low.  States that he has been taking 35 units of NovoLog and 45 units of Levemir. Has been feeling weak and dizzy. Has mid abdominal pain but no nausea, vomiting or diarrhea.   He was previously admitted early April for hyperglycemia and ambulatory dysfunction.  Then admitted again from 08/24/2021 to 09/02/2021 at [facility] for metabolic encephalopathy, hyperglycemia and fall.  He was found to have a right renal/perinephric abscess requiring IR drain placement with cultures positive for ESBL E. coli and has been on Invanz until 09/08/2021 per ID. However he left AMA from nursing home and presented to [facility] for continuation of his antibiotics on 4/21. Drain was removed last week.   In the ED, he was afebrile and normotensive on room air.  No leukocytosis.  He was found to have hyperglycemia greater than 800 with no anion gap, normal pH and CO2.  Lactate of 3.2.  Pseudohyponatremia of 123.  UA was negative  CT abdomen pelvis shows resolution of right perinephric abscess that was previously seen.  He was started on IV heparin infusion and transfer was made to [facility] for hospitalist management.   Review of Systems: As mentioned in the history of present illness. All other systems reviewed and are negative. Past Medical History:  Diagnosis Date   Diabetes mellitus  without complication (HCC)    Neuropathy    Past Surgical History:  Procedure Laterality Date   HEMORRHOID SURGERY     IR RADIOLOGIST EVAL & MGMT  09/16/2021   Social History:  reports that he has been smoking cigarettes. He has never used smokeless tobacco. He reports current alcohol use. He reports that he does not use drugs.  No Known Allergies  History reviewed. No pertinent family history.  Prior to Admission medications   Medication Sig Start Date End Date Taking? Authorizing Provider  clonazePAM (KLONOPIN) 0.5 MG tablet Take 0.5 tablets (0.25 mg total) by mouth 2 (two) times daily for 4 doses. 09/02/21 09/04/21  Lanae Boast, MD  diclofenac Sodium (VOLTAREN) 1 % GEL Apply 4 g topically 4 (four) times daily as needed (Chest wall pain). 09/02/21   Lanae Boast, MD  feeding supplement, GLUCERNA SHAKE, (GLUCERNA SHAKE) LIQD Take 237 mLs by mouth 2 (two) times daily between meals. 09/02/21   Lanae Boast, MD  FLUoxetine (PROZAC) 20 MG capsule Take 20 mg by mouth daily as needed (anxiety attacks).    [provider]  hydrOXYzine (ATARAX) 25 MG tablet Take 1 tablet (25 mg total) by mouth every 6 (six) hours as needed for anxiety or itching. 09/02/21   Lanae Boast, MD  insulin aspart (NOVOLOG) 100 UNIT/ML injection Inject 4 Units into the skin 3 (three) times daily with meals. 09/02/21   Lanae Boast, MD  insulin detemir (LEVEMIR) 100 UNIT/ML injection Inject 0.25 mLs (25 Units total) into the skin 2 (two) times daily. 09/02/21   Lanae Boast, MD  oxyCODONE-acetaminophen (  PERCOCET/ROXICET) 5-325 MG tablet Take 1 tablet by mouth every 6 (six) hours as needed for up to 4 doses for severe pain. 09/02/21   Lanae BoastKc, Ramesh, MD  pregabalin (LYRICA) 150 MG capsule Take 1 capsule (150 mg total) by mouth 3 (three) times daily for 4 doses. 09/02/21 09/04/21  Lanae BoastKc, Ramesh, MD  QUEtiapine (SEROQUEL) 100 MG tablet Take 100 mg by mouth at bedtime as needed (anxiety).    [provider]  senna-docusate (SENOKOT-S)  8.6-50 MG tablet Take 1 tablet by mouth 2 (two) times daily. 09/02/21   Lanae BoastKc, Ramesh, MD  traZODone (DESYREL) 150 MG tablet Take 1 tablet (150 mg total) by mouth at bedtime. 08/09/21   Mesner, Barbara CowerJason, MD    Physical Exam: Vitals:   09/29/21 2300 09/30/21 0000 09/30/21 0100 09/30/21 0200  BP: 131/87 113/83 (!) 156/96 134/84  Pulse:  83 80 79  Resp: (!) 21 (!) 22 13 18   Temp: 98.5 F (36.9 C) 98.7 F (37.1 C)    TempSrc: Oral Oral    SpO2: 100% 100% 100% 100%  Weight:      Height:       Constitutional: NAD, calm, comfortable, young male sitting upright in bed Eyes: lids and conjunctivae normal ENMT: Mucous membranes are moist.  Neck: normal, supple Respiratory: clear to auscultation bilaterally, no wheezing, no crackles. Normal respiratory effort. No accessory muscle use.  Cardiovascular: Regular rate and rhythm, no murmurs / rubs / gallops. No extremity edema.  Abdomen: Mild midepigastric tenderness without any guarding, rebound tenderness or rigidity., no masses palpated.  Bowel sounds positive.  Musculoskeletal: no clubbing / cyanosis. No joint deformity upper and lower extremities. Good ROM, no contractures. Normal muscle tone.  Skin: no rashes, lesions, ulcers.  Neurologic: CN 2-12 grossly intact. Strength 5/5 in all 4.  Psychiatric: Normal judgment and insight. Alert and oriented x 3. Normal mood. Data Reviewed:  See HPI  Assessment and Plan: * Hyperosmolar hyperglycemic state (HHS) (HCC) In the setting of uncontrolled type 1 diabetes.  [facility] Levemir 25 units twice daily, NovoLog 4 units 3 times daily and SSI.  However tells me at home he has been on 45 units of Levemir daily and 35 units of NovoLog daily. -- replete potassium - continue insulin gtt with goal of 140-180  - IV NS until BG <250, then switch to D5 1/2 NS  - BMP q4hr  - Pt adamant about having a diet. Since his BG was significantly improved on arrival, allowed for carb modified  diet.   History of anxiety/bipolar disorder Continue home medication pending medication reconciliation  Chronic pain syndrome Resume home medication pending med rec  AKI (acute kidney injury) (HCC) Presented with creatinine of 1.61.  Continue to monitor with aggressive IV fluid hydration overnight.      Advance Care Planning:   Code Status: Full Code   Consults: none  Family Communication: no family at bedside  Severity of Illness: The appropriate patient status for this patient is OBSERVATION. Observation status is judged to be reasonable and necessary in order to provide the required intensity of service to ensure the patient's safety. The patient's presenting symptoms, physical exam findings, and initial radiographic and laboratory data in the context of their medical condition is felt to place them at decreased risk for further clinical deterioration. Furthermore, it is anticipated that the patient will be medically stable for discharge from the hospital within 2 midnights of admission.   Author: Anselm Junglinghing T Aloura Matsuoka, DO 09/30/2021  3:30 AM  For on call review [email or web address].

## 2021-09-30 NOTE — Progress Notes (Addendum)
Inpatient Diabetes Program Recommendations  AACE/ADA: New Consensus Statement on Inpatient Glycemic Control (2015)  Target Ranges:  Prepandial:   less than 140 mg/dL      Peak postprandial:   less than 180 mg/dL (1-2 hours)      Critically ill patients:  140 - 180 mg/dL   Lab Results  Component Value Date   GLUCAP 317 (H) 09/30/2021   HGBA1C 14.6 (H) 08/18/2021    Review of Glycemic Control  Diabetes history: DM2 Outpatient Diabetes medications: Levemir 25 BID, Novolog 4 units TID Current orders for Inpatient glycemic control: IV insulin drip transitioned to Levemir 25 units QD, Novolog 0-20 TID with meals and 0-5 HS + 6 units TID with meals  HgbA1C - 14.6%  Have spoken to pt within the last month or two regarding his poor diabetes control and HgbA1C of 14.6%  Inpatient Diabetes Program Recommendations:    Agree with orders.  Follow daily.  Thank you. Ailene Ards, RD, LDN, CDE Inpatient Diabetes Coordinator [phone]   Addendum: Spoke with pt and he clarified his home meds.  Novolog 70/30 35 units TID Levemir 25 units QHS  Said he was living in [place], but now will be staying with a friend. Monitors blood sugars 3x/day, denies any hypos. Said blood sugars always > 200.

## 2021-09-30 NOTE — Discharge Summary (Incomplete)
Physician Discharge Summary   Patient: Victor Matthews MRN: [id number] DOB: [birth year]  Admit date:     09/29/2021  Discharge date: {dischdate:26783}  Discharge Physician: Alwyn Ren   PCP: System, Provider Not In   Recommendations at discharge:  {Tip this will not be part of the note when signed- Example include specific recommendations for outpatient follow-up, pending tests to follow-up on. (Optional):26781}  ***  Discharge Diagnoses: Principal Problem:   Hyperosmolar hyperglycemic state (HHS) (HCC) Active Problems:   Uncontrolled type 1 diabetes mellitus with hyperglycemia, with long-term current use of insulin (HCC)   Chronic pain syndrome   History of anxiety/bipolar disorder   AKI (acute kidney injury) (HCC)  Resolved Problems:   * No resolved hospital problems. [facility] Course: No notes on file  Assessment and Plan: * Hyperosmolar hyperglycemic state (HHS) (HCC) In the setting of uncontrolled type 1 diabetes.  Previously was discharged from [facility] on Levemir 25 units twice daily, NovoLog 4 units 3 times daily and SSI.  However tells me at home he has been on 45 units of Levemir daily and 35 units of NovoLog daily. -- replete potassium - continue insulin gtt with goal of 140-180  - IV NS until BG <250, then switch to D5 1/2 NS  - BMP q4hr  - Pt adamant about having a diet. Since his BG was significantly improved on arrival, allowed for carb modified diet.   History of anxiety/bipolar disorder Continue home medication pending medication reconciliation  Chronic pain syndrome Resume home medication pending med rec  AKI (acute kidney injury) (HCC) Presented with creatinine of 1.61.  Continue to monitor with aggressive IV fluid hydration overnight.      {Tip this will not be part of the note when signed Body mass index is 29.7 kg/m. , ,  (Optional):26781}  {(NOTE) Pain control PDMP Statment (Optional):26782} Consultants: *** Procedures performed: ***   Disposition: {Plan; Disposition:26390} Diet recommendation:  Discharge Diet Orders (From admission, onward)     Start     Ordered   09/30/21 0000  Diet - low sodium heart healthy        09/30/21 1159           {Diet_Plan:26776} DISCHARGE MEDICATION: Allergies as of 09/30/2021   No Known Allergies      Medication List     STOP taking these medications    feeding supplement (GLUCERNA SHAKE) Liqd   insulin aspart 100 UNIT/ML injection Commonly known as: novoLOG   morphine 15 MG 12 hr tablet Commonly known as: MS CONTIN       TAKE these medications    diclofenac Sodium 1 % Gel Commonly known as: VOLTAREN Apply 4 g topically 4 (four) times daily as needed (Chest wall pain).   FLUoxetine 20 MG capsule Commonly known as: PROZAC Take 20 mg by mouth daily as needed (anxiety attacks).   hydrOXYzine 25 MG tablet Commonly known as: ATARAX Take 1 tablet (25 mg total) by mouth every 6 (six) hours as needed for anxiety or itching.   insulin aspart protamine- aspart (70-30) 100 UNIT/ML injection Commonly known as: NOVOLOG MIX 70/30 Inject 0.35 mLs (35 Units total) into the skin 3 (three) times daily.   insulin detemir 100 UNIT/ML injection Commonly known as: LEVEMIR Inject 0.1 mLs (10 Units total) into the skin at bedtime. What changed:  how much to take when to take this   oxyCODONE-acetaminophen 7.5-325 MG tablet Commonly known as: PERCOCET Take 1 tablet by mouth every 8 (eight) hours.  pregabalin 200 MG capsule Commonly known as: LYRICA Take 200 mg by mouth 3 (three) times daily.   QUEtiapine 100 MG tablet Commonly known as: SEROQUEL Take 100 mg by mouth at bedtime as needed (anxiety).   traZODone 150 MG tablet Commonly known as: DESYREL Take 1 tablet (150 mg total) by mouth at bedtime.        Discharge Exam: Filed Weights   09/29/21 1747  Weight: 99.3 kg   ***  Condition at discharge: {DC Condition:26389}  The results of significant  diagnostics from this hospitalization (including imaging, microbiology, ancillary and laboratory) are listed below for reference.   Imaging Studies: CT Abdomen Pelvis Wo Contrast  Result Date: 09/29/2021 CLINICAL DATA:  Back pain.  Right renal abscess. EXAM: CT ABDOMEN AND PELVIS WITHOUT CONTRAST TECHNIQUE: Multidetector CT imaging of the abdomen and pelvis was performed following the standard protocol without IV contrast. RADIATION DOSE REDUCTION: This exam was performed according to the departmental dose-optimization program which includes automated exposure control, adjustment of the mA and/or kV according to patient size and/or use of iterative reconstruction technique. COMPARISON:  Sep 16, 2021. FINDINGS: Lower chest: Small right pleural effusion is noted with adjacent subsegmental atelectasis. Hepatobiliary: No focal liver abnormality is seen. No gallstones, gallbladder wall thickening, or biliary dilatation. Pancreas: Unremarkable. No pancreatic ductal dilatation or surrounding inflammatory changes. Spleen: Normal in size without focal abnormality. Adrenals/Urinary Tract: Adrenal glands are unremarkable. No significant right renal fluid collection is noted consistent with resolution of abscess status post drainage. No hydronephrosis or renal obstruction is noted. Urinary bladder is unremarkable. Stomach/Bowel: Stomach is within normal limits. Appendix appears normal. No evidence of bowel wall thickening, distention, or inflammatory changes. Vascular/Lymphatic: No significant vascular findings are present. No enlarged abdominal or pelvic lymph nodes. Reproductive: Prostate is unremarkable. Other: No abdominal wall hernia or abnormality. No abdominopelvic ascites. Musculoskeletal: No acute or significant osseous findings. IMPRESSION: Small right pleural effusion with adjacent subsegmental atelectasis. No evidence of recurrent right perinephric fluid collection or abscess. Electronically Signed   By: Lupita Raider M.D.   On: 09/29/2021 18:41   CT ABDOMEN PELVIS WO CONTRAST  Result Date: 09/16/2021 CLINICAL DATA:  Renal abscess, status post percutaneous drainage EXAM: CT ABDOMEN AND PELVIS WITHOUT CONTRAST TECHNIQUE: Multidetector CT imaging of the abdomen and pelvis was performed following the standard protocol without IV contrast. RADIATION DOSE REDUCTION: This exam was performed according to the departmental dose-optimization program which includes automated exposure control, adjustment of the mA and/or kV according to patient size and/or use of iterative reconstruction technique. COMPARISON:  08/30/2021 FINDINGS: Lower chest: Small right pleural effusion, significantly decreased from prior study. Improved consolidation/atelectasis at the right lung base. Hepatobiliary: No focal liver abnormality is seen. No gallstones, gallbladder wall thickening, or biliary dilatation. Pancreas: Unremarkable. No pancreatic ductal dilatation or surrounding inflammatory changes. Spleen: Normal in size without focal abnormality. Adrenals/Urinary Tract: No adrenal mass. Unremarkable kidneys. Stable pigtail drain catheter posterior to the right kidney. A few small gas bubbles are noted in the retroperitoneum just medial to the right kidney, significantly improved since previous. No residual undrained fluid collection. Urinary bladder physiologically distended. Stomach/Bowel: Stomach is partially distended by ingested material. Small bowel is nondilated. Normal appendix. The colon is nondilated. Anastomotic staple line in the distal rectum. Vascular/Lymphatic: No significant vascular findings are present. No enlarged abdominal or pelvic lymph nodes. Reproductive: Prostate is unremarkable. Other: Stable left pelvic phlebolith.  No ascites.  No free air. Musculoskeletal: No acute or significant osseous findings. IMPRESSION:  1. Resolution of right retroperitoneal abscess post drain catheter placement. 2. Resolving right pleural  effusion with small residual. 3. No acute findings. Electronically Signed   By: Corlis Leak M.D.   On: 09/16/2021 15:29   DG Chest 1 View  Result Date: 09/02/2021 CLINICAL DATA:  Right pleural effusion. Status post right thoracentesis. EXAM: CHEST  1 VIEW COMPARISON:  09/01/2021 FINDINGS: Small to moderate right pleural effusion shows decreased in size since previous study. Decreased atelectasis noted in the right lung base. No pneumothorax visualized. Right lung is clear. Heart size is within normal limits. Right upper quadrant pigtail catheter is again noted. IMPRESSION: Decreased right pleural effusion and right basilar atelectasis. No pneumothorax visualized. Electronically Signed   By: Danae Orleans M.D.   On: 09/02/2021 12:21   DG Chest Port 1 View  Result Date: 09/29/2021 CLINICAL DATA:  DKA. EXAM: PORTABLE CHEST 1 VIEW COMPARISON:  Chest x-ray 09/02/2021 FINDINGS: Small right pleural effusion and overlying atelectasis. The left lung is clear. The heart is normal in size. IMPRESSION: Small right pleural effusion and overlying atelectasis. Electronically Signed   By: Rudie Meyer M.D.   On: 09/29/2021 19:06   DG Chest Port 1 View  Result Date: 09/01/2021 CLINICAL DATA:  Shortness of breath. EXAM: PORTABLE CHEST 1 VIEW COMPARISON:  Chest x-ray 08/24/2021 and chest CT 08/25/2021. FINDINGS: The cardiac silhouette, mediastinal and hilar contours are within normal limits and stable. There is a persistent moderate-sized right pleural effusion with overlying atelectasis. The left lung is clear. The bony thorax is intact. IMPRESSION: Persistent moderate-sized right pleural effusion with overlying atelectasis. Electronically Signed   By: Rudie Meyer M.D.   On: 09/01/2021 13:33   IR Radiologist Eval & Mgmt  Result Date: 09/17/2021 Please refer to notes tab for details about interventional procedure. (Op Note)  US THORACENTESIS ASP PLEURAL SPACE W/IMG GUIDE  Result Date: 09/02/2021 INDICATION: Patient  with history of right perinephric abscess with prior drainage, right pleural effusion, dyspnea; request received for diagnostic and therapeutic right thoracentesis. EXAM: ULTRASOUND GUIDED DIAGNOSTIC AND THERAPEUTIC RIGHT THORACENTESIS MEDICATIONS: 10 mL 1% lidocaine COMPLICATIONS: None immediate. PROCEDURE: An ultrasound guided thoracentesis was thoroughly discussed with the patient and questions answered. The benefits, risks, alternatives and complications were also discussed. The patient understands and wishes to proceed with the procedure. Written consent was obtained. Ultrasound was performed to localize and mark an adequate pocket of fluid in the right chest. The area was then prepped and draped in the normal sterile fashion. 1% Lidocaine was used for local anesthesia. Under ultrasound guidance a 6 Fr Safe-T-Centesis catheter was introduced. Thoracentesis was performed. The catheter was removed and a dressing applied. FINDINGS: A total of approximately 520 cc of turbid, bloody fluid was removed. Samples were sent to the laboratory as requested by the clinical team. IMPRESSION: Successful ultrasound guided diagnostic and therapeutic right thoracentesis yielding 520 cc of pleural fluid. Read by: Jeananne Rama, PA-C Electronically Signed   By: Richarda Overlie M.D.   On: 09/02/2021 12:52    Microbiology: Results for orders placed or performed during the hospital encounter of 09/29/21  MRSA Next Gen by PCR, Nasal     Status: Abnormal   Collection Time: 09/29/21 10:25 PM   Specimen: Nasal Mucosa; Nasal Swab  Result Value Ref Range Status   MRSA by PCR Next Gen DETECTED (A) NOT DETECTED Final    Comment: CRITICAL RESULT CALLED TO, READ BACK BY AND VERIFIED WITH: CALLED TO GRIFFIN, C ON 09/30/21 AT 0048 BY  VAZQUEZ,J (NOTE) The GeneXpert MRSA Assay (FDA approved for NASAL specimens only), is one component of a comprehensive MRSA colonization surveillance program. It is not intended to diagnose MRSA infection nor  to guide or monitor treatment for MRSA infections. Test performance is not FDA approved in patients less than 34 years old. Performed at [facility], [address] [place], [place] [id number]     Labs: CBC: Recent Labs  Lab 09/29/21 1803 09/29/21 1820  WBC 4.1  --   NEUTROABS 2.5  --   HGB 10.8* 12.9*  HCT 32.2* 38.0*  MCV 79.7*  --   PLT 234  --    Basic Metabolic Panel: Recent Labs  Lab 09/29/21 1803 09/29/21 1820 09/29/21 2042 09/30/21 0231  NA 123* 124* 131* 135  K 4.3 4.2 3.4* 3.8  CL 90*  --  96* 100  CO2 22  --  27 27  GLUCOSE 841*  --  375* 322*  BUN 13  --  11 10  CREATININE 1.61*  --  1.43* 1.31*  CALCIUM 8.8*  --  9.1 8.8*   Liver Function Tests: No results for input(s): AST, ALT, ALKPHOS, BILITOT, PROT, ALBUMIN in the last 168 hours. CBG: Recent Labs  Lab 09/30/21 0800 09/30/21 0904 09/30/21 1009 09/30/21 1107 09/30/21 1208  GLUCAP 162* 151* 317* 274* 165*    Discharge time spent: {LESS THAN/GREATER THAN:26388} 30 minutes.  Signed: Alwyn RenElizabeth G Lacreasha Hinds, MD [facility] 09/30/2021

## 2021-09-30 NOTE — TOC Transition Note (Signed)
Transition of Care [facility]) - CM/SW Discharge Note   Patient Details  Name: Victor Matthews MRN: [id number] Date of Birth: [birth year]  [facility] CM/SW Contact:  Golda Acre, RN Phone Number: 09/30/2021, 12:11 PM   Clinical Narrative:    Pt states that his medciad has been moved to [place].  He also states that he has [employer].  Wants to custodial care person with him at all times.  Told that he insurance would not cover that.  But he could talk to [facility] about the caps program.  Discharged to return home.  No other needs.   Final next level of care: Home/Self Care Barriers to Discharge: No Barriers Identified   Patient Goals and CMS Choice Patient states their goals for this hospitalization and ongoing recovery are:: to go home CMS Medicare.gov Compare Post Acute Care list provided to:: Patient    Discharge Placement                       Discharge Plan and Services   Discharge Planning Services: CM Consult                                 Social Determinants of Health (SDOH) Interventions     Readmission Risk Interventions    08/25/2021    3:07 PM  Readmission Risk Prevention Plan  Transportation Screening Complete  PCP or Specialist Appt within 5-7 Days Complete  PCP or Specialist Appt within 3-5 Days Not Complete  Not Complete comments Patient will discharge to SNF.  HRI or Home Care Consult Complete  Social Work Consult for Recovery Care Planning/Counseling Complete  Palliative Care Screening Not Applicable  Medication Review [employer]) Complete

## 2021-09-30 NOTE — Assessment & Plan Note (Signed)
Continue home medication pending medication reconciliation

## 2021-09-30 NOTE — Progress Notes (Signed)
Discharge education and packet given to patient. Pt verbalized understanding. Vitals stable. Ivs removed from pt.

## 2021-10-01 NOTE — Discharge Summary (Addendum)
Physician Discharge Summary  Victor Matthews ZOX:[id number] DOB: 05/02/1986 DOA: 09/29/2021  PCP: System, Provider Not In  Admit date: 09/29/2021 Discharge date: 09/30/21 Admitted From: Home Disposition: Home  Recommendations for Outpatient Follow-up:  Follow up with PCP in 1-2 weeks Please obtain BMP/CBC in one week  Home Health: None Equipment/Devices: None Discharge Condition: Stable CODE STATUS: Full code Diet recommendation: Carb modified Brief/Interim Summary: Victor Matthews is a 36 y.o. male with medical history significant of poorly controlled type 1 diabetes, peripheral neuropathy, chronic pain on opioids, bipolar disorder, ambulatory dysfunction who presents here with hyperglycemia.  States that his sugar at home has been "high" but reports compliance with his insulin although he is running low.  States that he has been taking 35 units of NovoLog and 45 units of Levemir. Has been feeling weak and dizzy. Has mid abdominal pain but no nausea, vomiting or diarrhea.    He was previously admitted early April for hyperglycemia and ambulatory dysfunction.  Then admitted again from 08/24/2021 to 09/02/2021 at [facility] for metabolic encephalopathy, hyperglycemia and fall.  He was found to have a right renal/perinephric abscess requiring IR drain placement with cultures positive for ESBL E. coli and has been on Invanz until 09/08/2021 per ID. However he left AMA from nursing home and presented to [facility] for continuation of his antibiotics on 4/21. Drain was removed last week.    In the ED, he was afebrile and normotensive on room air.  No leukocytosis.  He was found to have hyperglycemia greater than 800 with no anion gap, normal pH and CO2.  Lactate of 3.2.  Pseudohyponatremia of 123.   UA was negative   CT abdomen pelvis shows resolution of right perinephric abscess that was previously seen.   He was started on IV insulin infusion and transfer was made to [facility] for hospitalist management.      Discharge Diagnoses:  Principal Problem:   Hyperosmolar hyperglycemic state (HHS) (HCC) Active Problems:   Uncontrolled type 1 diabetes mellitus with hyperglycemia, with long-term current use of insulin (HCC)   Chronic pain syndrome   History of anxiety/bipolar disorder   AKI (acute kidney injury) (HCC)    * Hyperosmolar hyperglycemic state (HHS) (HCC) In the setting of uncontrolled type 1 diabetes.  He was treated on Endo tool with IV insulin infusion.  He was planning to leave AMA even if I had not discharged him. He was discharged on 70/30 NovoLog 35 units 3 times a day   History of anxiety/bipolar disorder Continue home medication   Chronic pain syndrome Continue home medication   AKI (acute kidney injury) (HCC) Resolved  Estimated body mass index is 29.7 kg/m as calculated from the following:   Height as of this encounter: 6' (1.829 m).   Weight as of this encounter: 99.3 kg.  Discharge Instructions  Discharge Instructions     Diet - low sodium heart healthy   Complete by: As directed    Increase activity slowly   Complete by: As directed       Allergies as of 09/30/2021   No Known Allergies      Medication List     STOP taking these medications    feeding supplement (GLUCERNA SHAKE) Liqd   insulin aspart 100 UNIT/ML injection Commonly known as: novoLOG   morphine 15 MG 12 hr tablet Commonly known as: MS CONTIN       TAKE these medications    diclofenac Sodium 1 % Gel Commonly known as: VOLTAREN Apply 4  g topically 4 (four) times daily as needed (Chest wall pain).   FLUoxetine 20 MG capsule Commonly known as: PROZAC Take 20 mg by mouth daily as needed (anxiety attacks).   hydrOXYzine 25 MG tablet Commonly known as: ATARAX Take 1 tablet (25 mg total) by mouth every 6 (six) hours as needed for anxiety or itching.   insulin aspart protamine- aspart (70-30) 100 UNIT/ML injection Commonly known as: NOVOLOG MIX 70/30 Inject 0.35 mLs (35  Units total) into the skin 3 (three) times daily.   insulin detemir 100 UNIT/ML injection Commonly known as: LEVEMIR Inject 0.1 mLs (10 Units total) into the skin at bedtime. What changed:  how much to take when to take this   oxyCODONE-acetaminophen 7.5-325 MG tablet Commonly known as: PERCOCET Take 1 tablet by mouth every 8 (eight) hours.   pregabalin 200 MG capsule Commonly known as: LYRICA Take 200 mg by mouth 3 (three) times daily.   QUEtiapine 100 MG tablet Commonly known as: SEROQUEL Take 100 mg by mouth at bedtime as needed (anxiety).   traZODone 150 MG tablet Commonly known as: DESYREL Take 1 tablet (150 mg total) by mouth at bedtime.        No Known Allergies  Consultations: None   Procedures/Studies: CT Abdomen Pelvis Wo Contrast  Result Date: 09/29/2021 CLINICAL DATA:  Back pain.  Right renal abscess. EXAM: CT ABDOMEN AND PELVIS WITHOUT CONTRAST TECHNIQUE: Multidetector CT imaging of the abdomen and pelvis was performed following the standard protocol without IV contrast. RADIATION DOSE REDUCTION: This exam was performed according to the departmental dose-optimization program which includes automated exposure control, adjustment of the mA and/or kV according to patient size and/or use of iterative reconstruction technique. COMPARISON:  Sep 16, 2021. FINDINGS: Lower chest: Small right pleural effusion is noted with adjacent subsegmental atelectasis. Hepatobiliary: No focal liver abnormality is seen. No gallstones, gallbladder wall thickening, or biliary dilatation. Pancreas: Unremarkable. No pancreatic ductal dilatation or surrounding inflammatory changes. Spleen: Normal in size without focal abnormality. Adrenals/Urinary Tract: Adrenal glands are unremarkable. No significant right renal fluid collection is noted consistent with resolution of abscess status post drainage. No hydronephrosis or renal obstruction is noted. Urinary bladder is unremarkable. Stomach/Bowel:  Stomach is within normal limits. Appendix appears normal. No evidence of bowel wall thickening, distention, or inflammatory changes. Vascular/Lymphatic: No significant vascular findings are present. No enlarged abdominal or pelvic lymph nodes. Reproductive: Prostate is unremarkable. Other: No abdominal wall hernia or abnormality. No abdominopelvic ascites. Musculoskeletal: No acute or significant osseous findings. IMPRESSION: Small right pleural effusion with adjacent subsegmental atelectasis. No evidence of recurrent right perinephric fluid collection or abscess. Electronically Signed   By: Lupita Raider M.D.   On: 09/29/2021 18:41   CT ABDOMEN PELVIS WO CONTRAST  Result Date: 09/16/2021 CLINICAL DATA:  Renal abscess, status post percutaneous drainage EXAM: CT ABDOMEN AND PELVIS WITHOUT CONTRAST TECHNIQUE: Multidetector CT imaging of the abdomen and pelvis was performed following the standard protocol without IV contrast. RADIATION DOSE REDUCTION: This exam was performed according to the departmental dose-optimization program which includes automated exposure control, adjustment of the mA and/or kV according to patient size and/or use of iterative reconstruction technique. COMPARISON:  08/30/2021 FINDINGS: Lower chest: Small right pleural effusion, significantly decreased from prior study. Improved consolidation/atelectasis at the right lung base. Hepatobiliary: No focal liver abnormality is seen. No gallstones, gallbladder wall thickening, or biliary dilatation. Pancreas: Unremarkable. No pancreatic ductal dilatation or surrounding inflammatory changes. Spleen: Normal in size without focal abnormality. Adrenals/Urinary Tract:  No adrenal mass. Unremarkable kidneys. Stable pigtail drain catheter posterior to the right kidney. A few small gas bubbles are noted in the retroperitoneum just medial to the right kidney, significantly improved since previous. No residual undrained fluid collection. Urinary bladder  physiologically distended. Stomach/Bowel: Stomach is partially distended by ingested material. Small bowel is nondilated. Normal appendix. The colon is nondilated. Anastomotic staple line in the distal rectum. Vascular/Lymphatic: No significant vascular findings are present. No enlarged abdominal or pelvic lymph nodes. Reproductive: Prostate is unremarkable. Other: Stable left pelvic phlebolith.  No ascites.  No free air. Musculoskeletal: No acute or significant osseous findings. IMPRESSION: 1. Resolution of right retroperitoneal abscess post drain catheter placement. 2. Resolving right pleural effusion with small residual. 3. No acute findings. Electronically Signed   By: Corlis Leak M.D.   On: 09/16/2021 15:29   DG Chest 1 View  Result Date: 09/02/2021 CLINICAL DATA:  Right pleural effusion. Status post right thoracentesis. EXAM: CHEST  1 VIEW COMPARISON:  09/01/2021 FINDINGS: Small to moderate right pleural effusion shows decreased in size since previous study. Decreased atelectasis noted in the right lung base. No pneumothorax visualized. Right lung is clear. Heart size is within normal limits. Right upper quadrant pigtail catheter is again noted. IMPRESSION: Decreased right pleural effusion and right basilar atelectasis. No pneumothorax visualized. Electronically Signed   By: Danae Orleans M.D.   On: 09/02/2021 12:21   DG Chest Port 1 View  Result Date: 09/29/2021 CLINICAL DATA:  DKA. EXAM: PORTABLE CHEST 1 VIEW COMPARISON:  Chest x-ray 09/02/2021 FINDINGS: Small right pleural effusion and overlying atelectasis. The left lung is clear. The heart is normal in size. IMPRESSION: Small right pleural effusion and overlying atelectasis. Electronically Signed   By: Rudie Meyer M.D.   On: 09/29/2021 19:06   IR Radiologist Eval & Mgmt  Result Date: 09/17/2021 Please refer to notes tab for details about interventional procedure. (Op Note)  US THORACENTESIS ASP PLEURAL SPACE W/IMG GUIDE  Result Date:  09/02/2021 INDICATION: Patient with history of right perinephric abscess with prior drainage, right pleural effusion, dyspnea; request received for diagnostic and therapeutic right thoracentesis. EXAM: ULTRASOUND GUIDED DIAGNOSTIC AND THERAPEUTIC RIGHT THORACENTESIS MEDICATIONS: 10 mL 1% lidocaine COMPLICATIONS: None immediate. PROCEDURE: An ultrasound guided thoracentesis was thoroughly discussed with the patient and questions answered. The benefits, risks, alternatives and complications were also discussed. The patient understands and wishes to proceed with the procedure. Written consent was obtained. Ultrasound was performed to localize and mark an adequate pocket of fluid in the right chest. The area was then prepped and draped in the normal sterile fashion. 1% Lidocaine was used for local anesthesia. Under ultrasound guidance a 6 Fr Safe-T-Centesis catheter was introduced. Thoracentesis was performed. The catheter was removed and a dressing applied. FINDINGS: A total of approximately 520 cc of turbid, bloody fluid was removed. Samples were sent to the laboratory as requested by the clinical team. IMPRESSION: Successful ultrasound guided diagnostic and therapeutic right thoracentesis yielding 520 cc of pleural fluid. Read by: Jeananne Rama, PA-C Electronically Signed   By: Richarda Overlie M.D.   On: 09/02/2021 12:52   (Echo, Carotid, EGD, Colonoscopy, ERCP)    Subjective:  Resting in bed he ate dinner last night he is on insulin infusion and asking to go home ASAP Discharge Exam: Vitals:   09/30/21 1000 09/30/21 1100  BP: (!) 143/88 139/90  Pulse: 65 72  Resp: 17 18  Temp:    SpO2: 100% 100%   Vitals:   09/30/21  0800 09/30/21 0900 09/30/21 1000 09/30/21 1100  BP: (!) 150/96 (!) 147/84 (!) 143/88 139/90  Pulse: 74 66 65 72  Resp: 14 19 17 18   Temp: 98.2 F (36.8 C)     TempSrc: Oral     SpO2: 100% 100% 100% 100%  Weight:      Height:        General: Pt is alert, awake, not in acute  distress Cardiovascular: RRR, S1/S2 +, no rubs, no gallops Respiratory: CTA bilaterally, no wheezing, no rhonchi Abdominal: Soft, NT, ND, bowel sounds + Extremities: no edema, no cyanosis    The results of significant diagnostics from this hospitalization (including imaging, microbiology, ancillary and laboratory) are listed below for reference.     Microbiology: Recent Results (from the past 240 hour(s))  MRSA Next Gen by PCR, Nasal     Status: Abnormal   Collection Time: 09/29/21 10:25 PM   Specimen: Nasal Mucosa; Nasal Swab  Result Value Ref Range Status   MRSA by PCR Next Gen DETECTED (A) NOT DETECTED Final    Comment: CRITICAL RESULT CALLED TO, READ BACK BY AND VERIFIED WITH: CALLED TO GRIFFIN, C ON 09/30/21 AT 0048 BY VAZQUEZ,J (NOTE) The GeneXpert MRSA Assay (FDA approved for NASAL specimens only), is one component of a comprehensive MRSA colonization surveillance program. It is not intended to diagnose MRSA infection nor to guide or monitor treatment for MRSA infections. Test performance is not FDA approved in patients less than 34 years old. Performed at [facility], [address] [place], [place] [id number]      Labs: BNP (last 3 results) No results for input(s): BNP in the last 8760 hours. Basic Metabolic Panel: Recent Labs  Lab 09/29/21 1803 09/29/21 1820 09/29/21 2042 09/30/21 0231  NA 123* 124* 131* 135  K 4.3 4.2 3.4* 3.8  CL 90*  --  96* 100  CO2 22  --  27 27  GLUCOSE 841*  --  375* 322*  BUN 13  --  11 10  CREATININE 1.61*  --  1.43* 1.31*  CALCIUM 8.8*  --  9.1 8.8*   Liver Function Tests: No results for input(s): AST, ALT, ALKPHOS, BILITOT, PROT, ALBUMIN in the last 168 hours. No results for input(s): LIPASE, AMYLASE in the last 168 hours. No results for input(s): AMMONIA in the last 168 hours. CBC: Recent Labs  Lab 09/29/21 1803 09/29/21 1820  WBC 4.1  --   NEUTROABS 2.5  --   HGB 10.8* 12.9*  HCT 32.2* 38.0*  MCV  79.7*  --   PLT 234  --    Cardiac Enzymes: No results for input(s): CKTOTAL, CKMB, CKMBINDEX, TROPONINI in the last 168 hours. BNP: Invalid input(s): POCBNP CBG: Recent Labs  Lab 09/30/21 0800 09/30/21 0904 09/30/21 1009 09/30/21 1107 09/30/21 1208  GLUCAP 162* 151* 317* 274* 165*   D-Dimer No results for input(s): DDIMER in the last 72 hours. Hgb A1c No results for input(s): HGBA1C in the last 72 hours. Lipid Profile No results for input(s): CHOL, HDL, LDLCALC, TRIG, CHOLHDL, LDLDIRECT in the last 72 hours. Thyroid function studies No results for input(s): TSH, T4TOTAL, T3FREE, THYROIDAB in the last 72 hours.  Invalid input(s): FREET3 Anemia work up No results for input(s): VITAMINB12, FOLATE, FERRITIN, TIBC, IRON, RETICCTPCT in the last 72 hours. Urinalysis    Component Value Date/Time   COLORURINE YELLOW 09/29/2021 1755   APPEARANCEUR CLEAR 09/29/2021 1755   LABSPEC <=1.005 09/29/2021 1755   PHURINE 5.0 09/29/2021 1755  GLUCOSEU >=500 (A) 09/29/2021 1755   HGBUR NEGATIVE 09/29/2021 1755   BILIRUBINUR NEGATIVE 09/29/2021 1755   KETONESUR NEGATIVE 09/29/2021 1755   PROTEINUR NEGATIVE 09/29/2021 1755   NITRITE NEGATIVE 09/29/2021 1755   LEUKOCYTESUR NEGATIVE 09/29/2021 1755   Sepsis Labs Invalid input(s): PROCALCITONIN,  WBC,  LACTICIDVEN Microbiology Recent Results (from the past 240 hour(s))  MRSA Next Gen by PCR, Nasal     Status: Abnormal   Collection Time: 09/29/21 10:25 PM   Specimen: Nasal Mucosa; Nasal Swab  Result Value Ref Range Status   MRSA by PCR Next Gen DETECTED (A) NOT DETECTED Final    Comment: CRITICAL RESULT CALLED TO, READ BACK BY AND VERIFIED WITH: CALLED TO GRIFFIN, C ON 09/30/21 AT 0048 BY VAZQUEZ,J (NOTE) The GeneXpert MRSA Assay (FDA approved for NASAL specimens only), is one component of a comprehensive MRSA colonization surveillance program. It is not intended to diagnose MRSA infection nor to guide or monitor treatment for MRSA  infections. Test performance is not FDA approved in patients less than 70 years old. Performed at [facility], [address] [place], [place] [address]      Time coordinating discharge:  39 minutes  SIGNED:   Alwyn Ren, MD  [facility] 10/01/2021, 4:02 PM

## 2021-10-05 ENCOUNTER — Encounter: Payer: Self-pay | Admitting: Family Medicine

## 2021-10-05 ENCOUNTER — Ambulatory Visit (INDEPENDENT_AMBULATORY_CARE_PROVIDER_SITE_OTHER): Payer: Medicare Other | Admitting: Family Medicine

## 2021-10-05 VITALS — BP 110/80 | HR 77 | Temp 98.1°F | Ht 72.0 in | Wt 214.2 lb

## 2021-10-05 DIAGNOSIS — E1042 Type 1 diabetes mellitus with diabetic polyneuropathy: Secondary | ICD-10-CM

## 2021-10-05 DIAGNOSIS — R296 Repeated falls: Secondary | ICD-10-CM

## 2021-10-05 DIAGNOSIS — G894 Chronic pain syndrome: Secondary | ICD-10-CM

## 2021-10-05 DIAGNOSIS — G629 Polyneuropathy, unspecified: Secondary | ICD-10-CM

## 2021-10-05 DIAGNOSIS — J90 Pleural effusion, not elsewhere classified: Secondary | ICD-10-CM

## 2021-10-05 DIAGNOSIS — F319 Bipolar disorder, unspecified: Secondary | ICD-10-CM | POA: Diagnosis not present

## 2021-10-05 MED ORDER — FLUOXETINE HCL 20 MG PO CAPS: 20.0000 mg | ORAL_CAPSULE | Freq: Every day | ORAL | 5 refills | Status: DC

## 2021-10-05 MED ORDER — OXYCODONE-ACETAMINOPHEN 7.5-325 MG PO TABS: 1.0000 | ORAL_TABLET | Freq: Three times a day (TID) | ORAL | 0 refills | Status: DC

## 2021-10-05 MED ORDER — HYDROXYZINE HCL 25 MG PO TABS: 25.0000 mg | ORAL_TABLET | Freq: Four times a day (QID) | ORAL | 5 refills | Status: AC | PRN

## 2021-10-05 MED ORDER — QUETIAPINE FUMARATE 100 MG PO TABS: 100.0000 mg | ORAL_TABLET | Freq: Every day | ORAL | 5 refills | Status: DC

## 2021-10-05 MED ORDER — INSULIN ASPART PROT & ASPART (70-30 MIX) 100 UNIT/ML ~~LOC~~ SUSP: 35.0000 [IU] | Freq: Three times a day (TID) | SUBCUTANEOUS | 11 refills | Status: DC

## 2021-10-05 MED ORDER — TRAZODONE HCL 150 MG PO TABS: 150.0000 mg | ORAL_TABLET | Freq: Every evening | ORAL | 5 refills | Status: AC | PRN

## 2021-10-05 NOTE — Progress Notes (Signed)
Chief Complaint  Patient presents with   New Patient (Initial Visit)    Needs Refills today Patient is homeless and disabled.  Needs letter from PCP needs a caretaker.       New Patient Visit SUBJECTIVE: HPI: Victor Matthews is an 36 y.o.male who is being seen for establishing care.  The patient was previously seen at [facility].  DM 1 Patient has a history of type 1 diabetes on NovoLog 70/30 35 units 3 times daily and Levemir 10 units daily.  He was just in the hospital for DKA/HHS.  He does not have an endocrinologist in this area.  Recently moved from [place], was talking about a pump at that time.  He used to be 400 pounds but then lost weight and started having diabetic neuropathy after losing weight.    Chronic pain/neuropathy He currently takes Lyrica 200 mg 3 times daily and Percocet 7.5-325 mg 3 times daily.  He was seeing a pain clinic prior to moving.  He would like to see 1 in this area.  He has been falling more frequently.  Requesting home health aide to come in intermittently to help with ambulation and medication administration as adherence has been an issue in the past.  He is currently living with friends.  Patient has a history of bipolar type I.  He is currently controlled on Seroquel 100 mg nightly, trazodone 150 mg nightly as needed, Prozac 20 mg daily and hydroxyzine 25 mg daily.  He is requesting to see a psychiatrist in this area.  He is seeing a therapist.  No homicidal or suicidal ideation.  No self-medication.  Past Medical History:  Diagnosis Date   Bipolar 1 disorder ([place])    Diabetes mellitus without complication ([place])    Neuropathy    Past Surgical History:  Procedure Laterality Date   HEMORRHOID SURGERY     IR RADIOLOGIST EVAL & MGMT  09/16/2021   Family History  Problem Relation Age of Onset   Diabetes Mother    Diabetes Father    Heart disease Father    Prostate cancer Maternal Grandfather        Unsure of age at diagnosis   No Known  Allergies  Current Outpatient Medications:    insulin detemir (LEVEMIR) 100 UNIT/ML injection, Inject 0.1 mLs (10 Units total) into the skin at bedtime., Disp: 10 mL, Rfl: 11   pregabalin (LYRICA) 200 MG capsule, Take 200 mg by mouth 3 (three) times daily., Disp: , Rfl:    FLUoxetine (PROZAC) 20 MG capsule, Take 1 capsule (20 mg total) by mouth daily., Disp: 30 capsule, Rfl: 5   hydrOXYzine (ATARAX) 25 MG tablet, Take 1 tablet (25 mg total) by mouth every 6 (six) hours as needed for anxiety or itching., Disp: 30 tablet, Rfl: 5   insulin aspart protamine- aspart (NOVOLOG MIX 70/30) (70-30) 100 UNIT/ML injection, Inject 0.35 mLs (35 Units total) into the skin 3 (three) times daily., Disp: 10 mL, Rfl: 11   oxyCODONE-acetaminophen (PERCOCET) 7.5-325 MG tablet, Take 1 tablet by mouth every 8 (eight) hours., Disp: 90 tablet, Rfl: 0   QUEtiapine (SEROQUEL) 100 MG tablet, Take 1 tablet (100 mg total) by mouth at bedtime., Disp: 30 tablet, Rfl: 5   traZODone (DESYREL) 150 MG tablet, Take 1 tablet (150 mg total) by mouth at bedtime as needed for sleep., Disp: 30 tablet, Rfl: 5  OBJECTIVE: BP 110/80   Pulse 77   Temp 98.1 F (36.7 C) (Oral)   Ht 6' (1.829  m)   Wt 214 lb 4 oz (97.2 kg)   SpO2 99%   BMI 29.06 kg/m  General:  well developed, well nourished, in no apparent distress Skin:  no significant moles, warts, or growths on exposed skin Throat/Pharynx:  MMM Lungs:  clear to auscultation, breath sounds equal bilaterally, no respiratory distress Cardio:  regular rate and rhythm, no LE edema or bruits Neuro:  gait normal Psych: well oriented with normal range of affect and appropriate judgment/insight  ASSESSMENT/PLAN: Bipolar 1 disorder (HCC) - Plan: FLUoxetine (PROZAC) 20 MG capsule, hydrOXYzine (ATARAX) 25 MG tablet, traZODone (DESYREL) 150 MG tablet, QUEtiapine (SEROQUEL) 100 MG tablet, Ambulatory referral to Psychiatry  Neuropathy - Plan: Ambulatory referral to Home Health  Chronic pain  syndrome - Plan: oxyCODONE-acetaminophen (PERCOCET) 7.5-325 MG tablet, Ambulatory referral to Pain Clinic, Ambulatory referral to [place]  Frequent falls - Plan: Ambulatory referral to Home Health  Pleural effusion - Plan: DG Chest 2 View  Type 1 diabetes mellitus with diabetic polyneuropathy ([place]) - Plan: insulin aspart protamine- aspart (NOVOLOG MIX 70/30) (70-30) 100 UNIT/ML injection, Ambulatory referral to Endocrinology  Chronic, stable.  Refer to psychiatry.  Continue Seroquel 100 mg nightly, trazodone 150 mg nightly as needed, Prozac 20 mg daily, hydroxyzine 25 mg 3 times daily as needed.  Continue with therapy. Continue Lyrica 20 mg 3 times daily. Chronic, stable.  Continue Percocet 7.5-225 mg 3 times daily, refer to pain management. I told him I do not rx this type of medication long term.  Home health aide. Will ck CXR in 4 weeks. Order placed. Recall placed in chart. Refer endo. Not controlled, may need to discuss pump.  Patient should return in 6 mo for CPE or prn. The patient voiced understanding and agreement to the plan.   Elliott, DO 10/05/21  4:11 PM

## 2021-10-05 NOTE — Patient Instructions (Addendum)
If you do not hear anything about your 4 referrals in the next 1-2 weeks, call our office and ask for an update.  [facility] [address] [place], [place] [address] [phone]  [facility] [address] [place], [place] [address] [phone]  [facility] [address] [place], [place] [address] [phone]  [facility] [address], [place], [place], #[phone] [address], [place], [place], #[phone]  [facility] [address] [place], [place] [address] [phone]  [facility] [address] [place], [place] [phone]  [facility] [address] [place], [place] [phone]  [facility] [address] [place], [place]   [phone].   Please go online to complete a form to submit first.  [facility]  [address] [place], [place] [phone].     [facility] -  local practices located at: [address] [place], [place]. [phone]. [address] [place], [place], [place].  [phone]. [address], [place], [place].  [phone].  Contact one of these offices sooner than later as it can take 2-3 months to get a new patient appointment.

## 2021-10-06 ENCOUNTER — Ambulatory Visit: Payer: Medicare Other | Admitting: Medical

## 2021-10-08 ENCOUNTER — Encounter (HOSPITAL_BASED_OUTPATIENT_CLINIC_OR_DEPARTMENT_OTHER): Payer: Self-pay

## 2021-10-08 ENCOUNTER — Telehealth: Payer: Self-pay | Admitting: Family Medicine

## 2021-10-08 ENCOUNTER — Emergency Department (HOSPITAL_BASED_OUTPATIENT_CLINIC_OR_DEPARTMENT_OTHER)
Admission: EM | Admit: 2021-10-08 | Discharge: 2021-10-08 | Disposition: A | Discharge status: Home / Self Care | Payer: Medicare Other | Attending: Emergency Medicine | Admitting: Emergency Medicine

## 2021-10-08 ENCOUNTER — Other Ambulatory Visit: Payer: Self-pay

## 2021-10-08 DIAGNOSIS — F17210 Nicotine dependence, cigarettes, uncomplicated: Secondary | ICD-10-CM | POA: Diagnosis not present

## 2021-10-08 DIAGNOSIS — Z794 Long term (current) use of insulin: Secondary | ICD-10-CM | POA: Insufficient documentation

## 2021-10-08 DIAGNOSIS — E1140 Type 2 diabetes mellitus with diabetic neuropathy, unspecified: Secondary | ICD-10-CM | POA: Insufficient documentation

## 2021-10-08 DIAGNOSIS — E1165 Type 2 diabetes mellitus with hyperglycemia: Secondary | ICD-10-CM | POA: Insufficient documentation

## 2021-10-08 DIAGNOSIS — R739 Hyperglycemia, unspecified: Secondary | ICD-10-CM | POA: Diagnosis present

## 2021-10-08 LAB — BASIC METABOLIC PANEL
Anion gap: 7 (ref 5–15)
BUN: 15 mg/dL (ref 6–20)
CO2: 24 mmol/L (ref 22–32)
Calcium: 8.8 mg/dL — ABNORMAL LOW (ref 8.9–10.3)
Chloride: 100 mmol/L (ref 98–111)
Creatinine, Ser: 1.36 mg/dL — ABNORMAL HIGH (ref 0.61–1.24)
GFR, Estimated: >60 mL/min (ref >60)
Glucose, Bld: 466 mg/dL — ABNORMAL HIGH (ref 70–99)
Potassium: 4.1 mmol/L (ref 3.5–5.1)
Sodium: 131 mmol/L — ABNORMAL LOW (ref 135–145)

## 2021-10-08 LAB — CBC
HCT: 35 % — ABNORMAL LOW (ref 39.0–52.0)
Hemoglobin: 11.7 g/dL — ABNORMAL LOW (ref 13.0–17.0)
MCH: 26.7 pg (ref 26.0–34.0)
MCHC: 33.4 g/dL (ref 30.0–36.0)
MCV: 79.9 fL — ABNORMAL LOW (ref 80.0–100.0)
Platelets: 244 10*3/uL (ref 150–400)
RBC: 4.38 MIL/uL (ref 4.22–5.81)
RDW: 15 % (ref 11.5–15.5)
WBC: 3.1 10*3/uL — ABNORMAL LOW (ref 4.0–10.5)
nRBC: 0 % (ref 0.0–0.2)

## 2021-10-08 LAB — URINALYSIS, ROUTINE W REFLEX MICROSCOPIC
Bilirubin Urine: NEGATIVE
Glucose, UA: >=500 mg/dL — AB
Hgb urine dipstick: NEGATIVE
Ketones, ur: NEGATIVE mg/dL
Leukocytes,Ua: NEGATIVE
Nitrite: NEGATIVE
Protein, ur: NEGATIVE mg/dL
Specific Gravity, Urine: 1.015 (ref 1.005–1.030)
pH: 5.5 (ref 5.0–8.0)

## 2021-10-08 LAB — URINALYSIS, MICROSCOPIC (REFLEX)

## 2021-10-08 LAB — CBG MONITORING, ED
Glucose-Capillary: 432 mg/dL — ABNORMAL HIGH (ref 70–99)
Glucose-Capillary: 500 mg/dL — ABNORMAL HIGH (ref 70–99)
Glucose-Capillary: 444 mg/dL — ABNORMAL HIGH (ref 70–99)
Glucose-Capillary: 357 mg/dL — ABNORMAL HIGH (ref 70–99)

## 2021-10-08 MED ORDER — INSULIN ASPART 100 UNIT/ML IJ SOLN
5.0000 [IU] | Freq: Once | INTRAMUSCULAR | Status: AC
  Administered 2021-10-08: 5 [IU] via SUBCUTANEOUS

## 2021-10-08 MED ORDER — INSULIN ASPART PROT & ASPART (70-30 MIX) 100 UNIT/ML ~~LOC~~ SUSP
5.0000 [IU] | Freq: Once | SUBCUTANEOUS | Status: AC
Start: 2021-10-08 — End: 2021-10-08
  Administered 2021-10-08: 5 [IU] via SUBCUTANEOUS
  Filled 2021-10-08: qty 5

## 2021-10-08 MED ORDER — SODIUM CHLORIDE 0.9 % IV BOLUS
1000.0000 mL | Freq: Once | INTRAVENOUS | Status: AC
  Administered 2021-10-08: 1000 mL via INTRAVENOUS

## 2021-10-08 MED ORDER — SODIUM CHLORIDE 0.9 % IV BOLUS: 1000.0000 mL | Freq: Once | INTRAVENOUS | Status: DC

## 2021-10-08 NOTE — ED Notes (Signed)
Excell Seltzer, [place] states okay for discharge

## 2021-10-08 NOTE — ED Notes (Signed)
Victor Matthews, [place] and Dr. Wyvonnia Dusky at bedside speaking with the patient.

## 2021-10-08 NOTE — ED Provider Notes (Signed)
[facility] EMERGENCY DEPARTMENT Provider Note   CSN: [id number] Arrival date & time: 10/08/21  1358     History  Chief Complaint  Patient presents with   Hyperglycemia    Victor Matthews is a 36 y.o. male.   Hyperglycemia  36 year old male presents emergency department with complaints of hyperglycemia.  Patient states that he had a glucose reading that was too high for his home monitor which reads up to 500.  He also states he had a fall earlier this morning due to increase in neuropathic symptoms of his lower extremities.  He states that his neuropathy gets worse as his blood glucose increases.  He denies trauma to his head or pain in any specific location secondary to fall.  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  He states he is relatively new to the area, and established new relationship with PCP three days ago at his first appointment. He states his primary care has set up a referral for endocrinologist for further assessment/management of his elevated blood sugar. Currently patient is asymptomatic besides his high blood glucose reading. He was admitted on 09/29/21 for HONK. Denies fever, chills, night sweats, CP, SOB, abdominal pain, N/V/D, urinary symptoms, change in bowel habits, visual changes.   PMH significant for DM, neuropathy, bipolar 1  Home Medications Prior to Admission medications   Medication Sig Start Date End Date Taking? Authorizing Provider  FLUoxetine (PROZAC) 20 MG capsule Take 1 capsule (20 mg total) by mouth daily. 10/05/21   Sharlene Dory, DO  hydrOXYzine (ATARAX) 25 MG tablet Take 1 tablet (25 mg total) by mouth every 6 (six) hours as needed for anxiety or itching. 10/05/21   Wendling, Jilda Roche, DO  insulin aspart protamine- aspart (NOVOLOG MIX 70/30) (70-30) 100 UNIT/ML injection Inject 0.35 mLs (35 Units total) into the skin 3 (three) times daily. 10/05/21   Wendling, Jilda Roche, DO   insulin detemir (LEVEMIR) 100 UNIT/ML injection Inject 0.1 mLs (10 Units total) into the skin at bedtime. 09/30/21   Alwyn Ren, MD  oxyCODONE-acetaminophen (PERCOCET) 7.5-325 MG tablet Take 1 tablet by mouth every 8 (eight) hours. 10/05/21   Sharlene Dory, DO  pregabalin (LYRICA) 200 MG capsule Take 200 mg by mouth 3 (three) times daily. 09/16/21   [provider]  QUEtiapine (SEROQUEL) 100 MG tablet Take 1 tablet (100 mg total) by mouth at bedtime. 10/05/21   Sharlene Dory, DO  traZODone (DESYREL) 150 MG tablet Take 1 tablet (150 mg total) by mouth at bedtime as needed for sleep. 10/05/21   Sharlene Dory, DO      Allergies    Patient has no known allergies.    Review of Systems   Review of Systems  All other systems reviewed and are negative.  Physical Exam Updated Vital Signs BP 135/83   Pulse 62   Temp 98.2 F (36.8 C) (Oral)   Resp (!) 24   Ht 6' (1.829 m)   Wt 97.2 kg   SpO2 100%   BMI 29.06 kg/m  Physical Exam Constitutional:      General: He is not in acute distress.    Appearance: Normal appearance. He is not ill-appearing, toxic-appearing or diaphoretic.  HENT:     Nose: Nose normal.     Mouth/Throat:     Mouth: Mucous membranes are moist.     Pharynx: Oropharynx is clear.  Eyes:     General:  Right eye: No discharge.        Left eye: No discharge.     Extraocular Movements: Extraocular movements intact.     Conjunctiva/sclera: Conjunctivae normal.  Cardiovascular:     Rate and Rhythm: Normal rate and regular rhythm.     Pulses: Normal pulses.     Heart sounds: Normal heart sounds. No murmur heard. Pulmonary:     Effort: Pulmonary effort is normal.     Breath sounds: No wheezing or rales.  Abdominal:     General: Abdomen is flat. Bowel sounds are normal.     Palpations: Abdomen is soft.     Tenderness: There is no abdominal tenderness. There is no guarding.  Musculoskeletal:        General: Normal range  of motion.     Cervical back: Normal range of motion and neck supple. No tenderness.     Right lower leg: No edema.     Left lower leg: No edema.  Skin:    General: Skin is warm and dry.     Capillary Refill: Capillary refill takes less than 2 seconds.  Neurological:     General: No focal deficit present.     Mental Status: He is alert and oriented to person, place, and time.  Psychiatric:        Mood and Affect: Mood normal.        Behavior: Behavior normal.    ED Results / Procedures / Treatments   Labs (all labs ordered are listed, but only abnormal results are displayed) Labs Reviewed  BASIC METABOLIC PANEL - Abnormal; Notable for the following components:      Result Value   Sodium 131 (*)    Glucose, Bld 466 (*)    Creatinine, Ser 1.36 (*)    Calcium 8.8 (*)    All other components within normal limits  CBC - Abnormal; Notable for the following components:   WBC 3.1 (*)    Hemoglobin 11.7 (*)    HCT 35.0 (*)    MCV 79.9 (*)    All other components within normal limits  URINALYSIS, ROUTINE W REFLEX MICROSCOPIC - Abnormal; Notable for the following components:   Glucose, UA >=500 (*)    All other components within normal limits  URINALYSIS, MICROSCOPIC (REFLEX) - Abnormal; Notable for the following components:   Bacteria, UA FEW (*)    All other components within normal limits  CBG MONITORING, ED - Abnormal; Notable for the following components:   Glucose-Capillary 432 (*)    All other components within normal limits  CBG MONITORING, ED - Abnormal; Notable for the following components:   Glucose-Capillary 500 (*)    All other components within normal limits  CBG MONITORING, ED - Abnormal; Notable for the following components:   Glucose-Capillary 444 (*)    All other components within normal limits  CBG MONITORING, ED - Abnormal; Notable for the following components:   Glucose-Capillary 357 (*)    All other components within normal limits  CBG MONITORING, ED     EKG None  Radiology No results found.  Procedures Procedures    Medications Ordered in ED Medications  sodium chloride 0.9 % bolus 1,000 mL (1,000 mLs Intravenous Patient Refused/Not Given 10/08/21 1531)  insulin aspart protamine- aspart (NOVOLOG MIX 70/30) injection 5 Units (5 Units Subcutaneous Given 10/08/21 1542)  sodium chloride 0.9 % bolus 1,000 mL (0 mLs Intravenous Stopped 10/08/21 1741)  insulin aspart (novoLOG) injection 5 Units (5 Units Subcutaneous Given 10/08/21  [id number])    ED Course/ Medical Decision Making/ A&P Clinical Course as of 10/08/21 2219  Fri Oct 08, 2021  1525 Patient refused IV fluids and elected for oral rehydration. [CR]  1618 CBG monitoring, ED(!) Repeat CBG after insulin given showed glucose above 500.  When addressing the patient, he elected to leave AGAINST MEDICAL ADVICE.  I mentioned talking to his primary care provider who is in the same building before he decides to leave. [CR]    Clinical Course User Index [CR] Peter Garterobbins, Jaydon Soroka A, PA                           Medical Decision Making Amount and/or Complexity of Data Reviewed Labs: ordered. Decision-making details documented in ED Course.  Risk Prescription drug management.   This patient presents to the ED for concern of Hyperglycemia, this involves an extensive number of treatment options, and is a complaint that carries with it a high risk of complications and morbidity.  The differential diagnosis includes HONK, DKA, hyperglycemia, cushings   Co morbidities that complicate the patient evaluation  DM, neuropathy, bipolar 1   Additional history obtained:  Additional history obtained from BMP from 09/29/21 External records from outside source obtained and reviewed including Glucose >800   Lab Tests:  I Ordered, and personally interpreted labs.  The pertinent results include:  CBG of [id number], UA glucose >500, BMP creatinine 1.36   Imaging Studies  ordered:  N/a   Cardiac Monitoring: / EKG:  The patient was maintained on a cardiac monitor.  I personally viewed and interpreted the cardiac monitored which showed an underlying rhythm of: sinus rhythm   Consultations Obtained:  N/a   Problem List / ED Course / Critical interventions / Medication management  hyperglycemia I ordered medication including  Novolog 5u Novolog mix 5u 1L NS  Reevaluation of the patient after these medicines showed that the patient improved I have reviewed the patients home medicines and have made adjustments as needed   Social Determinants of Health:  Cigarette smoker .5ppd   Test / Admission - Considered:  Hyperglycemia VS within normal range and stable throughout visit Laboratory studies significant for hyperglycemia w/o signs of HONK/DKA Treatment of patient was significantly delayed due to refusal of administration of insulin as well as IV fluids on multiple occassions. Eventual patient was compliant, and blood glucose dropped within readable range on patient's at home glucometer. Patient was compliant with treatment only after he talked to his PCP over the phone.  Patient was advised close f/u with PCP for further management of his blood glucose. He was made aware of patient's current visit to the ED and spoke to the patient directly. Worrisome signs and symptoms were discussed with the patient, and the patient acknowledged understanding to return to the ED if noticed. Patient was stable upon discharge.          Final Clinical Impression(s) / ED Diagnoses Final diagnoses:  Hyperglycemia    Rx / DC Orders ED Discharge Orders     None         Peter GarterRobbins, Izen Petz A, [place] 10/08/21 2219    Cheryll CockayneHong, Joshua S, MD 10/09/21 [phone]

## 2021-10-08 NOTE — ED Notes (Signed)
Pt is refusing IV fluids, states he wants to drink water instead. Burt Knack, [place] informed.

## 2021-10-08 NOTE — ED Notes (Signed)
Pt A&OX4 ambulatory at d/c with independent steady gait. Pt verbalized understanding of d/c instructions and follow up care. 

## 2021-10-08 NOTE — ED Notes (Signed)
Pt reports he is ready to go home. PA informed.

## 2021-10-08 NOTE — ED Notes (Signed)
Victor Matthews informed of pts blood sugar of 500. Pt states he wants to go home still. Victor Matthews at bedside to speak with patient about this. Pt states he will call his PCP right now to discuss home management of his blood sugar.

## 2021-10-08 NOTE — ED Notes (Signed)
Pt aware of urine sample. Pt has urinal to void in when he can void.

## 2021-10-08 NOTE — Discharge Instructions (Addendum)
I included information on a diabetic diet as well as exercise.  Both are vital for management of your diabetes along with medication.  Please follow-up with your primary care provider as soon as possible for an appointment for further management of blood sugar.  Follow-up with endocrinologist concerning future appointment as well.  Continue take your blood glucose levels at home and manage your sliding scale insulin as directed by primary care provider.  Please do not hesitate to return to emergency department if the worrisome signs and symptoms we discussed become apparent.

## 2021-10-08 NOTE — Telephone Encounter (Signed)
Pt called from the ED downstairs and states his last reading was 500 before they gave him insulin. He states he would like to discuss different rx options once he leaves. Advised pt pcp would be made aware.

## 2021-10-08 NOTE — ED Triage Notes (Signed)
Patient here POV from Home.  Endorses CBG at Home and states it read "High". States it has been increasing the past few days. Also fell today due to neuropathy.   Endorses No recent Changes in Diabetes Medication. Recently discharged from Hospital for DKA/HHS and saw PCP a few days PTA.  NAD Noted during Triage. A&Ox4. Gcs 15. Ambulatory.

## 2021-10-12 NOTE — Telephone Encounter (Signed)
Called and scheduled OV 10/13/21 at 3 PM.

## 2021-10-13 ENCOUNTER — Ambulatory Visit: Payer: Medicare Other | Admitting: Family Medicine

## 2021-10-20 ENCOUNTER — Other Ambulatory Visit: Payer: Self-pay | Admitting: Family Medicine

## 2021-10-20 ENCOUNTER — Telehealth: Payer: Self-pay | Admitting: Family Medicine

## 2021-10-20 DIAGNOSIS — G894 Chronic pain syndrome: Secondary | ICD-10-CM

## 2021-10-20 DIAGNOSIS — E1042 Type 1 diabetes mellitus with diabetic polyneuropathy: Secondary | ICD-10-CM

## 2021-10-20 MED ORDER — OXYCODONE-ACETAMINOPHEN 7.5-325 MG PO TABS: 1.0000 | ORAL_TABLET | Freq: Three times a day (TID) | ORAL | 0 refills | Status: DC

## 2021-10-20 MED ORDER — INSULIN ASPART PROT & ASPART (70-30 MIX) 100 UNIT/ML ~~LOC~~ SUSP: 35.0000 [IU] | Freq: Three times a day (TID) | SUBCUTANEOUS | 0 refills | Status: DC

## 2021-10-20 MED ORDER — PREGABALIN 200 MG PO CAPS: 200.0000 mg | ORAL_CAPSULE | Freq: Three times a day (TID) | ORAL | 0 refills | Status: AC

## 2021-10-20 NOTE — Telephone Encounter (Signed)
Patient informed refills done.

## 2021-10-20 NOTE — Telephone Encounter (Signed)
Medication:   oxyCODONE-acetaminophen (PERCOCET) 7.5-325 MG tablet [[id number]]   insulin aspart protamine- aspart (NOVOLOG MIX 70/30) (70-30) 100 UNIT/ML injection [[id number]]   pregabalin (LYRICA) 200 MG capsule [[id number]]   Has the patient contacted their pharmacy? No. (If no, request that the patient contact the pharmacy for the refill.) (If yes, when and what did the pharmacy advise?)  Preferred Pharmacy (with phone number or street name):   [facility] [address] [place], [place] [address] Phone: [phone]  Agent: Please be advised that RX refills may take up to 3 business days. We ask that you follow-up with your pharmacy.

## 2021-10-20 NOTE — Addendum Note (Signed)
Addended by: Radene Gunning on: 10/20/2021 12:52 PM   Modules accepted: Orders

## 2021-10-20 NOTE — Telephone Encounter (Signed)
The patient is currently out of town and wanting all 3 sent to the [facility] in [place] I did inform of PCP's instructions. He stated that the May fill by PCP was never filled as was early. He would normally get his controlled meds filled from pain management the first of the month

## 2021-10-20 NOTE — Telephone Encounter (Signed)
Called the patient and he stated when the Oxy was sent in

## 2021-10-25 ENCOUNTER — Telehealth: Payer: Self-pay

## 2021-10-25 NOTE — Telephone Encounter (Signed)
Initial Comment Caller states he is needing a prescription request. - Walking cane, He is experiencing neuropathy with pain and difficulty walking. GOTO Facility Not Listed [facility] Translation No Nurse Assessment Nurse: White, RN, Adelina Mings Date/Time (Eastern Time): 10/24/2021 10:43:09 AM Confirm and document reason for call. If symptomatic, describe symptoms. ---Caller states he neuropathy pain in the legs states he is scared of falling. States pain is right legs and lower back. States he takes lyrica and oxycodone. Slightly helps with pain. states he is in homeless shelter and has to walk. Does the patient have any new or worsening symptoms? ---Yes Will a triage be completed? ---Yes Related visit to physician within the last 2 weeks? ---No Does the PT have any chronic conditions? (i.e. diabetes, asthma, this includes High risk factors for pregnancy, etc.) ---Yes List chronic conditions. ---Diabetes, Is this a behavioral health or substance abuse call? ---No Guidelines Guideline Title Affirmed Question Affirmed Notes Nurse Date/Time (Eastern Time) Leg Pain Major surgery in past month White, RN, Adelina Mings 10/24/2021 10:47:18 AM Disp. Time Lamount Cohen Time) Disposition Final User PLEASE NOTE: All timestamps contained within this report are represented as [place] Standard Time. CONFIDENTIALTY NOTICE: This fax transmission is intended only for the addressee. It contains information that is legally privileged, confidential or otherwise protected from use or disclosure. If you are not the intended recipient, you are strictly prohibited from reviewing, disclosing, copying using or disseminating any of this information or taking any action in reliance on or regarding this information. If you have received this fax in error, please notify us immediately by telephone so that we can arrange for its return to [place]. Phone: [phone], Toll-Free: [phone], Fax: [phone] Page: 2 of 2 Call Id:  [id number] 10/24/2021 10:51:37 AM See HCP within 4 Hours (or PCP triage) Yes Cliffton Asters, RN, Margaree Mackintosh Disagree/Comply Comply Caller Understands Yes PreDisposition Go to ED Care Advice Given Per Guideline SEE HCP (OR PCP TRIAGE) WITHIN 4 HOURS: CALL BACK IF: * You become worse Referrals GO TO FACILITY OTHER - SPECIFY

## 2021-10-25 NOTE — Telephone Encounter (Signed)
Called the patient and he informed he was able to pickup his medication yesterday at the pharmacy and that he is doing much better today. Appreciated the followup phone call.

## 2021-11-01 ENCOUNTER — Telehealth: Payer: Self-pay

## 2021-11-01 MED ORDER — NOVOLOG 70/30 FLEXPEN RELION (70-30) 100 UNIT/ML ~~LOC~~ SUPN
35.0000 [IU] | PEN_INJECTOR | Freq: Three times a day (TID) | SUBCUTANEOUS | 0 refills | Status: DC
Start: 2021-11-01 — End: 2021-11-26

## 2021-11-01 NOTE — Telephone Encounter (Signed)
Sent in the pens 

## 2021-11-01 NOTE — Addendum Note (Signed)
Addended by: Scharlene Gloss B on: 11/01/2021 04:54 PM   Modules accepted: Orders

## 2021-11-01 NOTE — Telephone Encounter (Signed)
Patient called stating he is currently at the [place] homeless shelter and he needs a letter stating he is unable to sit outside due to his medical conditions ... Stated the sun is causing dehydration and would like to sit inside with the other people with disabilities since he uses a cane as well.     Fax number:  [phone]

## 2021-11-01 NOTE — Telephone Encounter (Signed)
Called the patient and informed of information. He did find a doc in [place] but not being seen since November 30, 2021. He could do a video visit for this. He has gone to the ER but they just give fluids///and he is told to followup with PCP. He needs the Novolog insulin pen/not the vial because he has no where to refrigerate the insulin.

## 2021-11-01 NOTE — Telephone Encounter (Signed)
Called informed the patient pens sent in and informed of PCP response to letter. The patient verbalized understanding and said was ok.

## 2021-11-17 ENCOUNTER — Telehealth: Payer: Self-pay | Admitting: Family Medicine

## 2021-11-17 DIAGNOSIS — G894 Chronic pain syndrome: Secondary | ICD-10-CM

## 2021-11-17 DIAGNOSIS — F319 Bipolar disorder, unspecified: Secondary | ICD-10-CM

## 2021-11-17 MED ORDER — QUETIAPINE FUMARATE 100 MG PO TABS: 100.0000 mg | ORAL_TABLET | Freq: Every day | ORAL | 0 refills | Status: AC

## 2021-11-17 MED ORDER — FLUOXETINE HCL 20 MG PO CAPS: 20.0000 mg | ORAL_CAPSULE | Freq: Every day | ORAL | 0 refills | Status: AC

## 2021-11-17 MED ORDER — OXYCODONE-ACETAMINOPHEN 7.5-325 MG PO TABS: 1.0000 | ORAL_TABLET | Freq: Three times a day (TID) | ORAL | 0 refills | Status: AC | PRN

## 2021-11-17 NOTE — Telephone Encounter (Signed)
Will send in enough until he can see his new PCP. He no showed for his follow up with me and is not in area anymore it seems so I won't be filling that oxy any longer. We can likely cancel his f/u in Nov w me as well. TY.

## 2021-11-17 NOTE — Telephone Encounter (Signed)
[name] is not listed as pcp, but there is no dismissal letter, please advise if he is a current pt. He is hoping to get refills early as he will be leaving town for a funeral.   Medication: oxyCODONE-acetaminophen (PERCOCET) 7.5-325 MG tablet   FLUoxetine (PROZAC) 20 MG capsule  QUEtiapine (SEROQUEL) 100 MG tablet   Has the patient contacted their pharmacy? No.  Preferred Pharmacy:  [id number] - [place], [place] - [address] AT [facility] [address]   [address], [place] [address]  Phone:  [phone]  Fax:  [phone]

## 2021-11-18 ENCOUNTER — Other Ambulatory Visit: Payer: Self-pay | Admitting: Family Medicine

## 2021-11-18 DIAGNOSIS — E1042 Type 1 diabetes mellitus with diabetic polyneuropathy: Secondary | ICD-10-CM

## 2021-11-18 NOTE — Telephone Encounter (Signed)
Pt called stating that the amount that he was given for oxycodone will not get him to his appt with his new PCP as they had double booked his appt and won't be seen till the end of the month. Pt also stated that he needed a refill on his Lyrica to get him to that appt. -

## 2021-11-25 ENCOUNTER — Telehealth: Payer: Self-pay

## 2021-11-25 NOTE — Telephone Encounter (Signed)
Pharmacy stated Novolog mix 70/30 flexpen  isnt covered with patient's insurance and Humalog Mix is covered .  Also unaware if pt has found a PCP in [place]

## 2021-11-25 NOTE — Telephone Encounter (Signed)
OK to change. He has an appt w a new PCP on 7/18 per Epic. Ty.

## 2021-11-26 MED ORDER — INSULIN LISPRO PROT & LISPRO (75-25 MIX) 100 UNIT/ML ~~LOC~~ SUSP
35.0000 [IU] | Freq: Three times a day (TID) | SUBCUTANEOUS | 0 refills | Status: DC
Start: 2021-11-26 — End: 2021-12-09

## 2021-11-26 NOTE — Telephone Encounter (Signed)
Updated and sent in. 

## 2021-11-26 NOTE — Addendum Note (Signed)
Addended by: Scharlene Gloss B on: 11/26/2021 08:23 AM   Modules accepted: Orders

## 2021-12-08 ENCOUNTER — Other Ambulatory Visit: Payer: Self-pay | Admitting: Family Medicine

## 2021-12-09 ENCOUNTER — Other Ambulatory Visit: Payer: Self-pay | Admitting: Family Medicine

## 2021-12-15 ENCOUNTER — Other Ambulatory Visit: Payer: Self-pay | Admitting: Family Medicine

## 2021-12-15 DIAGNOSIS — F319 Bipolar disorder, unspecified: Secondary | ICD-10-CM

## 2021-12-17 ENCOUNTER — Other Ambulatory Visit: Payer: Self-pay | Admitting: Family Medicine

## 2022-04-11 ENCOUNTER — Encounter: Payer: Medicare Other | Admitting: Family Medicine
